# Patient Record
Sex: Male | Born: 1960 | Race: White | Hispanic: No | Marital: Single | Smoking: Current some day smoker
Health system: Southern US, Community
[De-identification: ages and names within clinical notes are randomized; demographics above are authoritative.]

## PROBLEM LIST (undated history)

## (undated) DIAGNOSIS — I10 Essential (primary) hypertension: Secondary | ICD-10-CM

## (undated) DIAGNOSIS — I639 Cerebral infarction, unspecified: Secondary | ICD-10-CM

## (undated) DIAGNOSIS — E119 Type 2 diabetes mellitus without complications: Secondary | ICD-10-CM

## (undated) DIAGNOSIS — K219 Gastro-esophageal reflux disease without esophagitis: Secondary | ICD-10-CM

## (undated) DIAGNOSIS — E785 Hyperlipidemia, unspecified: Secondary | ICD-10-CM

## (undated) DIAGNOSIS — J45909 Unspecified asthma, uncomplicated: Secondary | ICD-10-CM

## (undated) DIAGNOSIS — H409 Unspecified glaucoma: Secondary | ICD-10-CM

## (undated) DIAGNOSIS — R011 Cardiac murmur, unspecified: Secondary | ICD-10-CM

## (undated) DIAGNOSIS — K08109 Complete loss of teeth, unspecified cause, unspecified class: Secondary | ICD-10-CM

## (undated) HISTORY — DX: Cerebral infarction, unspecified: I63.9

## (undated) HISTORY — DX: Unspecified asthma, uncomplicated: J45.909

## (undated) HISTORY — DX: Hyperlipidemia, unspecified: E78.5

## (undated) HISTORY — DX: Cardiac murmur, unspecified: R01.1

## (undated) HISTORY — DX: Unspecified glaucoma: H40.9

---

## 2006-09-28 ENCOUNTER — Emergency Department: Payer: Self-pay | Admitting: Emergency Medicine

## 2006-09-28 ENCOUNTER — Other Ambulatory Visit: Payer: Self-pay

## 2006-09-29 ENCOUNTER — Ambulatory Visit: Payer: Self-pay | Admitting: Emergency Medicine

## 2007-03-04 ENCOUNTER — Emergency Department: Payer: Self-pay | Admitting: Emergency Medicine

## 2010-01-16 ENCOUNTER — Emergency Department: Payer: Self-pay | Admitting: Emergency Medicine

## 2011-05-28 ENCOUNTER — Ambulatory Visit: Payer: Self-pay | Admitting: Internal Medicine

## 2011-06-25 ENCOUNTER — Ambulatory Visit: Payer: Self-pay | Admitting: Internal Medicine

## 2011-08-06 ENCOUNTER — Ambulatory Visit: Payer: Self-pay | Admitting: Internal Medicine

## 2011-08-25 HISTORY — PX: CARDIAC CATHETERIZATION: SHX172

## 2011-09-14 ENCOUNTER — Emergency Department: Payer: Self-pay | Admitting: *Deleted

## 2011-09-14 LAB — BASIC METABOLIC PANEL
Anion Gap: 10 (ref 7–16)
Calcium, Total: 8.6 mg/dL (ref 8.5–10.1)
Chloride: 111 mmol/L — ABNORMAL HIGH (ref 98–107)
Co2: 24 mmol/L (ref 21–32)
Osmolality: 291 (ref 275–301)

## 2011-09-14 LAB — CBC
HCT: 39.2 % — ABNORMAL LOW (ref 40.0–52.0)
HGB: 13.4 g/dL (ref 13.0–18.0)
MCH: 31.2 pg (ref 26.0–34.0)
MCV: 91 fL (ref 80–100)
RBC: 4.3 10*6/uL — ABNORMAL LOW (ref 4.40–5.90)
RDW: 13.9 % (ref 11.5–14.5)
WBC: 7.4 10*3/uL (ref 3.8–10.6)

## 2011-09-14 LAB — CK TOTAL AND CKMB (NOT AT ARMC)
CK, Total: 68 U/L (ref 35–232)
CK, Total: 64 U/L (ref 35–232)
CK-MB: <0.5 ng/mL — ABNORMAL LOW (ref 0.5–3.6)

## 2011-09-29 ENCOUNTER — Ambulatory Visit: Payer: Self-pay | Admitting: Cardiology

## 2011-10-09 ENCOUNTER — Ambulatory Visit: Payer: Self-pay | Admitting: Internal Medicine

## 2011-12-09 ENCOUNTER — Emergency Department: Payer: Self-pay | Admitting: Emergency Medicine

## 2011-12-09 LAB — COMPREHENSIVE METABOLIC PANEL
Albumin: 4 g/dL (ref 3.4–5.0)
Anion Gap: 10 (ref 7–16)
BUN: 14 mg/dL (ref 7–18)
Co2: 22 mmol/L (ref 21–32)
Creatinine: 0.77 mg/dL (ref 0.60–1.30)
EGFR (Non-African Amer.): >60
Glucose: 87 mg/dL (ref 65–99)
SGOT(AST): 31 U/L (ref 15–37)
Total Protein: 7.5 g/dL (ref 6.4–8.2)

## 2011-12-09 LAB — CBC
HCT: 41.9 % (ref 40.0–52.0)
MCH: 30.5 pg (ref 26.0–34.0)
MCHC: 32.9 g/dL (ref 32.0–36.0)
Platelet: 180 10*3/uL (ref 150–440)
RBC: 4.52 10*6/uL (ref 4.40–5.90)
WBC: 7 10*3/uL (ref 3.8–10.6)

## 2011-12-09 LAB — TROPONIN I
Troponin-I: <0.02 ng/mL
Troponin-I: <0.02 ng/mL

## 2012-06-22 ENCOUNTER — Ambulatory Visit: Payer: Self-pay | Admitting: Internal Medicine

## 2012-06-22 LAB — CREATININE, SERUM
EGFR (African American): >60
EGFR (Non-African Amer.): >60

## 2012-12-20 ENCOUNTER — Emergency Department: Payer: Self-pay | Admitting: Emergency Medicine

## 2012-12-20 LAB — COMPREHENSIVE METABOLIC PANEL
Alkaline Phosphatase: 112 U/L (ref 50–136)
Anion Gap: 7 (ref 7–16)
BUN: 19 mg/dL — ABNORMAL HIGH (ref 7–18)
Chloride: 113 mmol/L — ABNORMAL HIGH (ref 98–107)
Creatinine: 0.67 mg/dL (ref 0.60–1.30)
Glucose: 110 mg/dL — ABNORMAL HIGH (ref 65–99)
Osmolality: 286 (ref 275–301)
Potassium: 3.9 mmol/L (ref 3.5–5.1)
SGPT (ALT): 45 U/L (ref 12–78)
Total Protein: 7.4 g/dL (ref 6.4–8.2)

## 2012-12-20 LAB — CBC
HGB: 13.9 g/dL (ref 13.0–18.0)
MCH: 31.5 pg (ref 26.0–34.0)
MCHC: 34.5 g/dL (ref 32.0–36.0)
MCV: 91 fL (ref 80–100)
Platelet: 184 10*3/uL (ref 150–440)
RBC: 4.42 10*6/uL (ref 4.40–5.90)
RDW: 13.9 % (ref 11.5–14.5)

## 2012-12-20 LAB — TROPONIN I: Troponin-I: <0.02 ng/mL

## 2014-01-05 ENCOUNTER — Ambulatory Visit: Payer: Self-pay

## 2014-04-15 ENCOUNTER — Inpatient Hospital Stay: Payer: Self-pay | Admitting: Internal Medicine

## 2014-04-15 LAB — CBC WITH DIFFERENTIAL/PLATELET
BASOS ABS: 0.1 10*3/uL (ref 0.0–0.1)
Basophil %: 0.9 %
Eosinophil #: 0.1 10*3/uL (ref 0.0–0.7)
Eosinophil %: 1.3 %
HCT: 46.3 % (ref 40.0–52.0)
HGB: 15.5 g/dL (ref 13.0–18.0)
LYMPHS ABS: 1.6 10*3/uL (ref 1.0–3.6)
Lymphocyte %: 27.2 %
MCH: 32 pg (ref 26.0–34.0)
MCHC: 33.4 g/dL (ref 32.0–36.0)
MCV: 96 fL (ref 80–100)
MONO ABS: 0.3 x10 3/mm (ref 0.2–1.0)
Monocyte %: 5.5 %
NEUTROS ABS: 3.9 10*3/uL (ref 1.4–6.5)
Neutrophil %: 65.1 %
Platelet: 166 10*3/uL (ref 150–440)
RBC: 4.85 10*6/uL (ref 4.40–5.90)
RDW: 13.8 % (ref 11.5–14.5)
WBC: 6 10*3/uL (ref 3.8–10.6)

## 2014-04-15 LAB — URINALYSIS, COMPLETE
BLOOD: NEGATIVE
Bacteria: NONE SEEN
Bilirubin,UR: NEGATIVE
Glucose,UR: >=500 mg/dL (ref 0–75)
Leukocyte Esterase: NEGATIVE
Nitrite: NEGATIVE
Ph: 5 (ref 4.5–8.0)
Protein: NEGATIVE
RBC, UR: NONE SEEN /HPF (ref 0–5)
SPECIFIC GRAVITY: 1.03 (ref 1.003–1.030)
SQUAMOUS EPITHELIAL: NONE SEEN
WBC UR: NONE SEEN /HPF (ref 0–5)

## 2014-04-15 LAB — BASIC METABOLIC PANEL
ANION GAP: 10 (ref 7–16)
BUN: 15 mg/dL (ref 7–18)
CHLORIDE: 98 mmol/L (ref 98–107)
CREATININE: 1.1 mg/dL (ref 0.60–1.30)
Calcium, Total: 8.7 mg/dL (ref 8.5–10.1)
Co2: 24 mmol/L (ref 21–32)
EGFR (African American): >60
EGFR (Non-African Amer.): >60
GLUCOSE: 732 mg/dL — AB (ref 65–99)
OSMOLALITY: 301 (ref 275–301)
Potassium: 4.3 mmol/L (ref 3.5–5.1)
SODIUM: 132 mmol/L — AB (ref 136–145)

## 2014-04-15 LAB — TSH: Thyroid Stimulating Horm: 0.952 u[IU]/mL

## 2014-04-15 LAB — HEMOGLOBIN A1C: Hemoglobin A1C: 11 % — ABNORMAL HIGH (ref 4.2–6.3)

## 2014-04-15 LAB — BETA-HYDROXYBUTYRIC ACID: Beta-Hydroxybutyrate: 16.4 mg/dL — ABNORMAL HIGH (ref 0.2–2.8)

## 2014-04-16 LAB — COMPREHENSIVE METABOLIC PANEL
ALBUMIN: 3.3 g/dL — AB (ref 3.4–5.0)
ALK PHOS: 121 U/L — AB
Anion Gap: 9 (ref 7–16)
BUN: 13 mg/dL (ref 7–18)
Bilirubin,Total: 0.4 mg/dL (ref 0.2–1.0)
CO2: 24 mmol/L (ref 21–32)
CREATININE: 0.74 mg/dL (ref 0.60–1.30)
Calcium, Total: 8.2 mg/dL — ABNORMAL LOW (ref 8.5–10.1)
Chloride: 110 mmol/L — ABNORMAL HIGH (ref 98–107)
EGFR (Non-African Amer.): >60
GLUCOSE: 169 mg/dL — AB (ref 65–99)
OSMOLALITY: 289 (ref 275–301)
Potassium: 3.5 mmol/L (ref 3.5–5.1)
SGOT(AST): 26 U/L (ref 15–37)
SGPT (ALT): 56 U/L
Sodium: 143 mmol/L (ref 136–145)
Total Protein: 6.5 g/dL (ref 6.4–8.2)

## 2014-04-16 LAB — CBC WITH DIFFERENTIAL/PLATELET
BASOS ABS: 0 10*3/uL (ref 0.0–0.1)
Basophil %: 0.5 %
EOS ABS: 0.1 10*3/uL (ref 0.0–0.7)
Eosinophil %: 1.6 %
HCT: 40.9 % (ref 40.0–52.0)
HGB: 13.7 g/dL (ref 13.0–18.0)
LYMPHS ABS: 2.8 10*3/uL (ref 1.0–3.6)
Lymphocyte %: 46.3 %
MCH: 31.4 pg (ref 26.0–34.0)
MCHC: 33.6 g/dL (ref 32.0–36.0)
MCV: 94 fL (ref 80–100)
MONOS PCT: 6.3 %
Monocyte #: 0.4 x10 3/mm (ref 0.2–1.0)
Neutrophil #: 2.7 10*3/uL (ref 1.4–6.5)
Neutrophil %: 45.3 %
Platelet: 161 10*3/uL (ref 150–440)
RBC: 4.37 10*6/uL — AB (ref 4.40–5.90)
RDW: 13.7 % (ref 11.5–14.5)
WBC: 6 10*3/uL (ref 3.8–10.6)

## 2014-04-16 LAB — LIPID PANEL
CHOLESTEROL: 161 mg/dL (ref 0–200)
HDL: 24 mg/dL — AB (ref 40–60)
Triglycerides: 410 mg/dL — ABNORMAL HIGH (ref 0–200)

## 2014-05-04 ENCOUNTER — Emergency Department: Payer: Self-pay | Admitting: Emergency Medicine

## 2014-06-20 LAB — HM DIABETES EYE EXAM

## 2014-08-03 ENCOUNTER — Emergency Department: Payer: Self-pay | Admitting: Emergency Medicine

## 2014-08-03 LAB — CBC
HCT: 48.4 % (ref 40.0–52.0)
HGB: 15.6 g/dL (ref 13.0–18.0)
MCH: 30.5 pg (ref 26.0–34.0)
MCHC: 32.3 g/dL (ref 32.0–36.0)
MCV: 94 fL (ref 80–100)
PLATELETS: 191 10*3/uL (ref 150–440)
RBC: 5.13 10*6/uL (ref 4.40–5.90)
RDW: 14.3 % (ref 11.5–14.5)
WBC: 5.8 10*3/uL (ref 3.8–10.6)

## 2014-08-03 LAB — COMPREHENSIVE METABOLIC PANEL
ALBUMIN: 3.9 g/dL (ref 3.4–5.0)
ALK PHOS: 80 U/L
ALT: 48 U/L
AST: 35 U/L (ref 15–37)
Anion Gap: 5 — ABNORMAL LOW (ref 7–16)
BILIRUBIN TOTAL: 0.5 mg/dL (ref 0.2–1.0)
BUN: 15 mg/dL (ref 7–18)
CALCIUM: 9 mg/dL (ref 8.5–10.1)
CO2: 29 mmol/L (ref 21–32)
Chloride: 107 mmol/L (ref 98–107)
Creatinine: 0.98 mg/dL (ref 0.60–1.30)
EGFR (African American): >60
EGFR (Non-African Amer.): >60
Glucose: 81 mg/dL (ref 65–99)
Osmolality: 281 (ref 275–301)
POTASSIUM: 4.1 mmol/L (ref 3.5–5.1)
SODIUM: 141 mmol/L (ref 136–145)
Total Protein: 8.5 g/dL — ABNORMAL HIGH (ref 6.4–8.2)

## 2014-08-03 LAB — TROPONIN I

## 2014-12-15 NOTE — Discharge Summary (Signed)
PATIENT NAME:  Roy LeisureHOMPSON, Roy Norton MR#:  [id number] DATE OF BIRTH:  1960/08/26  DATE OF ADMISSION:  04/15/2014  DATE OF DISCHARGE:  04/16/2014  PRESENTING COMPLAINT: Dry mouth, generalized weakness, blurred vision.   DISCHARGE DIAGNOSES:  1. Uncontrolled type 2 diabetes.  2. Morbid obesity.  3. Hypertension.  4. Migraine headaches.  CODE STATUS: Full code.   MEDICATIONS:  1. Topamax 50 mg 1 tablet b.i.d.  2. Lopressor 1.25 one tablet b.i.d. as needed.  3. Metoprolol ER 25 mg daily.  4. Prilosec 20 mg p.o. daily.  5. Lisinopril 2.5 mg daily.  6. Lovastatin 10 mg daily.  7. Glipizide 10 mg b.i.d.  8. Metformin 500 p.o. daily.   [facility] in 2 to 4 weeks.   BRIEF SUMMARY OF HOSPITAL COURSE:  Zettie PhoDanny Kirwan is a 54 year old obese Caucasian gentleman who came in to the Emergency Room with generalized weakness, blurred vision, and dry mouth. He was found to have:   1. Type 2 diabetes new diagnosis came in with hypoglycemia was on insulin, detemir and SSI. His hemoglobin A1c is. He was started on p.o. metformin b.i.d. Diabetes education was provided prior to discharge.  2. Hyponatremia initially due to elevated sugars and dehydration. Stable.  3. Hypertension on metoprolol and lisinopril.  4. Hyperlipidemia on statins.  5. Migraine headaches. Continue Topamax.   The patient will [facility] in 2 to 4 weeks. He will buy a glucometer and testing strips to keep a log of his sugars. Hospital stay otherwise remained stable.   CODE STATUS: The patient remained a full code.   TIME SPENT: 40 minutes.    ____________________________ Wylie HailSona A. Allena KatzPatel, MD sap:JT D: 04/19/2014 13:41:34 ET T: 04/19/2014 23:21:32 ET JOB#: [id number]  cc: Laurajean Hosek A. Allena KatzPatel, MD, <Dictator> Willow OraSONA A Clifford Benninger MD ELECTRONICALLY SIGNED 05/01/2014 14:56

## 2014-12-15 NOTE — H&P (Signed)
PATIENT NAME:  Roy Norton, Roy E MR#:  [id number] DATE OF BIRTH:  [birth year]  PRIMARY CARE PHYSICIAN: [facility].  The patient is a [age] year old Caucasian male with past medical history significant for history of arrhythmias, chest pains in the past, which were investigated by Dr. Judithann SheenSparks at least 2 years ago with a stress test, which was normal. Also hypertension, hyperlipidemia, migraine headaches, who presents to the hospital with complaints of not feeling well for the past 1 week. Apparently, he has been having dry mouth, also generalized weakness, dizziness, and blurry vision in both eyes. He arrived in the Emergency Room, he was noted to have blood glucose level of more than 700, and hospitalist services were contacted for admission.   PAST MEDICAL HISTORY: Significant for history of hypertension, hyperlipidemia, obesity, migraine headaches, anxiety, depression, gastroesophageal reflux disease, as well as chest pains as well as arrhythmias, investigated by Dr. Judithann SheenSparks more than 2 years ago with stress test, which was unremarkable.   MEDICATIONS: According to medical records, the patient is on metoprolol succinate 25 mg p.o. daily, Prilosec 20  mg p.o. daily, Topamax 50 mg p.o. twice daily. He was on alprazolam in the past for his anxiety; however, he is not taking this medication anymore.  PAST SURGICAL HISTORY:  None.  ALLERGIES:  None.  FAMILY HISTORY: Hypertension in the patient's mother, diabetes mellitus in patient's father; PTSD, as well as alcoholism, in patient's father.   SOCIAL HISTORY: The patient is single, has no children. Smokes approximately 1 pack per day since the age of 54.  Denies alcohol abuse. He works for [employer].   REVIEW OF SYSTEMS: Positive for feeling chilly for the past 1 week. Some blurring of vision and dry mouth. Also feeling very polydipsic and polyuric. Snoring as well as sleepiness in daytime is also prevalent. Coughing with some yellow phlegm for  the past 1 week. Also, lung sound wheezing. Has some intermittent chest pains, for which he takes aspirin intermittently as needed. As mentioned above, he had stress test approximately 2 years ago, which was negative. Admits to having intermittent palpitations, also feeling presyncopal over the past 1 week. Increasing frequency of urination and polyuria. Mentions cramping intermittently in his lower extremities. Denies any fevers, pain, weight loss or gain. No rash or ulcer, double vision, glaucoma, cataracts, denies any tinnitus, allergies, epistaxis, sinus pain, dentures, difficulty swallowing. Denies any  hemoptysis, asthma, COPD.  CARDIOVASCULAR: Denies orthopnea, arrhythmias.  GASTROINTESTINAL: Denies nausea, vomiting, diarrhea, or constipation.  GENITOURINARY: Denies dysuria, hematuria, and incontinence.  ENDOCRINE: Admits to polydipsia, admits to nocturia. Denies any thyroid problems. No heat or cold intolerance. Admits thirst.  HEMATOLOGIC: Denies anemia, easy bruising, bleeding, swollen glands.  SKIN: Denies any acne, rashes, lesions, or moles.  MUSCULOSKELETAL: Denies arthritis, cramps, swelling of joints.  NEUROLOGIC: No numbness, epilepsy, or tremor.  PSYCHIATRIC: Denies anxiety, insomnia, or depression.   PHYSICAL EXAMINATION: VITAL SIGNS: On arrival to the hospital, temperature is 98.2, pulse is 94, respiration was 20, blood pressure 159/87, saturation was 93% on room air.  GENERAL: This is a well-nourished, obese, Caucasian male in no significant distress, lying on the stretcher.  HEENT: His pupils equal, reactive to light, extraocular muscles intact, no icterus or conjunctivitis, has normal hearing. No pharyngeal erythema. Mucosa is dry.  NECK: No masses. Supple, nontender. Thyroid is not enlarged. No adenopathy. No JVD or carotid bruits bilaterally. Full range of motion.  LUNGS: Clear to auscultation, though diminished breath sounds especially on the bases. No significant wheezing  was noted. No labored inspirations, increased effort, dullness to percussion, or overt respiratory distress. He has intermittent coughing.  CARDIOVASCULAR: S1, S2 present. The rhythm is regular. PMI not lateralized. Chest is nontender to palpation. 1+ pedal pulses. No lower extremity edema, calf tenderness or cyanosis was noted.  ABDOMEN: Soft, nontender, protuberant. Bowel sounds are present. No splenomegalia or masses were noted.  RECTAL: Deferred.  EXTREMITIES:  Muscle strength: Able to move all extremities. No cyanosis, degenerative joint disease, or kyphosis. Gait was not tested.  SKIN: No evidence of rashes, lesions, erythema. No history of nodularity or induration. It was warm and dry to palpation. No adenopathy in the cervical region.  NEUROLOGICAL: Cranial nerves grossly intact. Sensory is intact. No dysphasia, aphasia. The patient is alert, oriented to time, person, place, cooperative. Memory is good.  No significant confusion, agitation, or depression noted.   LABORATORY DATA: BMP showed a glucose level of 732, sodium 132, beta hydroxybutyrate was 16.4, which is elevated. The patient's bicarbonate level was 24. Liver enzymes are not checked. CBC within normal limits with white blood cell count 6.0, hemoglobin 15.5, platelet count 166,000. Absolute neutrophil  count is 3.9. Urinalysis: Colorless, clear urine. More than 500 glucose, negative for bilirubin, 1+ ketones, specific gravity 1.030, pH was 5.0, negative for blood, protein, nitrites, or leukocyte esterase. No red blood cells, white blood cells, bacteria, or epithelial cells.   RADIOLOGIC STUDIES: None.   ASSESSMENT AND PLAN: 1.  Diabetes mellitus, new diagnosis, with hyperglycemia, suspected mild diabetic ketoacidosis. Admit patient to medical floor in critical care unit, starting him on insulin intravenous drip. Get hemoglobin A1c.  Will ask dietary to see patient as well as diabetes education and educate him about possibly weight  loss, and this was discussed already on admission. 2.  Hyponatremia, likely dehydration. We will continue intravenous fluids. We will follow the patient's sodium level.  3.  Hypertension. We will add lisinopril. 4.  Hyperlipidemia. Get lipid panel. Will add statin as needed.  5.  Migraine headaches. We will continue Topamax.  6. Tobacco abuse. Counseling provided for approximately 4 to 5 minutes. Agreeable to try replacement therapy.   TIME SPENT: Fifty minutes on this patient.    ____________________________ Katharina Caper, MD rv:LT D: 04/15/2014 14:48:34 ET T: 04/15/2014 16:31:26 ET JOB#: [id number]  cc: Katharina Caper, MD, <Dictator> Cornelious Diven MD ELECTRONICALLY SIGNED 05/22/2014 14:50

## 2015-01-01 ENCOUNTER — Ambulatory Visit: Payer: Self-pay

## 2015-01-08 ENCOUNTER — Ambulatory Visit
Admission: RE | Admit: 2015-01-08 | Discharge: 2015-01-08 | Disposition: A | Discharge status: Home / Self Care | Payer: Self-pay | Source: Ambulatory Visit | Attending: Nurse Practitioner | Admitting: Nurse Practitioner

## 2015-01-08 ENCOUNTER — Other Ambulatory Visit: Payer: Self-pay

## 2015-01-08 DIAGNOSIS — R079 Chest pain, unspecified: Secondary | ICD-10-CM | POA: Insufficient documentation

## 2015-02-27 ENCOUNTER — Ambulatory Visit: Payer: Self-pay | Admitting: Internal Medicine

## 2015-03-18 ENCOUNTER — Encounter: Payer: Self-pay | Admitting: Emergency Medicine

## 2015-03-18 ENCOUNTER — Emergency Department: Payer: Self-pay

## 2015-03-18 ENCOUNTER — Emergency Department
Admission: EM | Admit: 2015-03-18 | Discharge: 2015-03-18 | Disposition: A | Discharge status: Home / Self Care | Payer: Self-pay | Attending: Emergency Medicine | Admitting: Emergency Medicine

## 2015-03-18 DIAGNOSIS — I10 Essential (primary) hypertension: Secondary | ICD-10-CM | POA: Insufficient documentation

## 2015-03-18 DIAGNOSIS — E119 Type 2 diabetes mellitus without complications: Secondary | ICD-10-CM | POA: Insufficient documentation

## 2015-03-18 DIAGNOSIS — R109 Unspecified abdominal pain: Secondary | ICD-10-CM | POA: Insufficient documentation

## 2015-03-18 HISTORY — DX: Type 2 diabetes mellitus without complications: E11.9

## 2015-03-18 HISTORY — DX: Essential (primary) hypertension: I10

## 2015-03-18 HISTORY — DX: Gastro-esophageal reflux disease without esophagitis: K21.9

## 2015-03-18 LAB — BASIC METABOLIC PANEL
ANION GAP: 8 (ref 5–15)
BUN: 23 mg/dL — ABNORMAL HIGH (ref 6–20)
CALCIUM: 9.5 mg/dL (ref 8.9–10.3)
CHLORIDE: 106 mmol/L (ref 101–111)
CO2: 23 mmol/L (ref 22–32)
Creatinine, Ser: 0.95 mg/dL (ref 0.61–1.24)
GFR calc Af Amer: >60 mL/min (ref >60)
GFR calc non Af Amer: >60 mL/min (ref >60)
Glucose, Bld: 90 mg/dL (ref 65–99)
Potassium: 4.1 mmol/L (ref 3.5–5.1)
Sodium: 137 mmol/L (ref 135–145)

## 2015-03-18 LAB — URINALYSIS COMPLETE WITH MICROSCOPIC (ARMC ONLY)
BACTERIA UA: NONE SEEN
BILIRUBIN URINE: NEGATIVE
Glucose, UA: NEGATIVE mg/dL
HGB URINE DIPSTICK: NEGATIVE
KETONES UR: NEGATIVE mg/dL
Leukocytes, UA: NEGATIVE
Nitrite: NEGATIVE
PH: 6 (ref 5.0–8.0)
Protein, ur: 30 mg/dL — AB
SPECIFIC GRAVITY, URINE: 1.031 — AB (ref 1.005–1.030)

## 2015-03-18 LAB — CBC WITH DIFFERENTIAL/PLATELET
BASOS ABS: 0 10*3/uL (ref 0–0.1)
Basophils Relative: 0 %
EOS PCT: 1 %
Eosinophils Absolute: 0.1 10*3/uL (ref 0–0.7)
HCT: 48.6 % (ref 40.0–52.0)
Hemoglobin: 16.2 g/dL (ref 13.0–18.0)
LYMPHS PCT: 29 %
Lymphs Abs: 2.1 10*3/uL (ref 1.0–3.6)
MCH: 31.1 pg (ref 26.0–34.0)
MCHC: 33.2 g/dL (ref 32.0–36.0)
MCV: 93.5 fL (ref 80.0–100.0)
MONOS PCT: 5 %
Monocytes Absolute: 0.3 10*3/uL (ref 0.2–1.0)
Neutro Abs: 4.7 10*3/uL (ref 1.4–6.5)
Neutrophils Relative %: 65 %
Platelets: 199 10*3/uL (ref 150–440)
RBC: 5.2 MIL/uL (ref 4.40–5.90)
RDW: 14.9 % — ABNORMAL HIGH (ref 11.5–14.5)
WBC: 7.2 10*3/uL (ref 3.8–10.6)

## 2015-03-18 MED ORDER — OXYCODONE-ACETAMINOPHEN 5-325 MG PO TABS
1.0000 | ORAL_TABLET | Freq: Once | ORAL | Status: AC
  Administered 2015-03-18: 1 via ORAL
  Filled 2015-03-18: qty 1

## 2015-03-18 MED ORDER — NAPROXEN 250 MG PO TABS: 250.0000 mg | ORAL_TABLET | Freq: Two times a day (BID) | ORAL | Status: DC

## 2015-03-18 MED ORDER — ONDANSETRON 8 MG PO TBDP
8.0000 mg | ORAL_TABLET | Freq: Once | ORAL | Status: AC
  Administered 2015-03-18: 8 mg via ORAL
  Filled 2015-03-18: qty 1

## 2015-03-18 MED ORDER — ONDANSETRON 8 MG PO TBDP: 8.0000 mg | ORAL_TABLET | Freq: Three times a day (TID) | ORAL | Status: DC | PRN

## 2015-03-18 NOTE — ED Provider Notes (Signed)
[facility] Emergency Department Provider Note  ____________________________________________  Time seen: 12:45 PM  I have reviewed the triage vital signs and the nursing notes.   HISTORY  Chief Complaint Back Pain    HPI Roy Norton is a 54 y.o. male who complains of bilateral flank pain that started last night. It was gradual in onset but worsened, colicky with intermittent episodes lasting a few minutes which are frequently severe in intensity. It's nonradiating from the bilateral flanks. No associated nausea vomiting diarrhea fevers chills or anterior abdominal pain. No syncope or lightheadedness, no numbness tingling or weakness or pain in the legs. Denies chest pain or shortness of breath. No aggravating or alleviating factors  Denies any heavy lifting or fall strips or slips.   Past Medical History  Diagnosis Date  . Hypertension   . GERD (gastroesophageal reflux disease)   . Diabetes mellitus without complication     There are no active problems to display for this patient.   History reviewed. No pertinent past surgical history.  Current Outpatient Rx  Name  Route  Sig  Dispense  Refill  . naproxen (NAPROSYN) 250 MG tablet   Oral   Take 1 tablet (250 mg total) by mouth 2 (two) times daily with a meal.   40 tablet   0   . ondansetron (ZOFRAN ODT) 8 MG disintegrating tablet   Oral   Take 1 tablet (8 mg total) by mouth every 8 (eight) hours as needed for nausea or vomiting.   20 tablet   0     Allergies Review of patient's allergies indicates no known allergies.  History reviewed. No pertinent family history.  Social History History  Substance Use Topics  . Smoking status: Never Smoker   . Smokeless tobacco: Not on file  . Alcohol Use: No    Review of Systems  Constitutional: No fever or chills. No weight changes Eyes:No blurry vision or double vision.  ENT: No sore throat. Cardiovascular: No chest pain. Respiratory:  No dyspnea or cough. Gastrointestinal: Negative for abdominal pain, vomiting and diarrhea.  No BRBPR or melena. Genitourinary: Negative for dysuria, urinary retention, bloody urine, or difficulty urinating. Musculoskeletal: Bilateral flank pain Skin: Negative for rash. Neurological: Negative for headaches, focal weakness or numbness. Psychiatric:No anxiety or depression.   Endocrine:No hot/cold intolerance, changes in energy, or sleep difficulty.  10-point ROS otherwise negative.  ____________________________________________   PHYSICAL EXAM:  VITAL SIGNS: ED Triage Vitals  Enc Vitals Group     BP 03/18/15 0942 137/62 mmHg     Pulse Rate 03/18/15 0942 68     Resp 03/18/15 0942 18     Temp 03/18/15 0942 97.6 F (36.4 C)     Temp Source 03/18/15 0942 Oral     SpO2 03/18/15 0942 97 %     Weight 03/18/15 0942 225 lb (102.059 kg)     Height 03/18/15 0942  (1.778 m)     Head Cir --      Peak Flow --      Pain Score 03/18/15 0945 5     Pain Loc --      Pain Edu? --      Excl. in GC? --      Constitutional: Alert and oriented. Well appearing and in no distress. Eyes: No scleral icterus. No conjunctival pallor. PERRL. EOMI ENT   Head: Normocephalic and atraumatic.   Nose: No congestion/rhinnorhea. No septal hematoma   Mouth/Throat: MMM, no pharyngeal erythema. No peritonsillar  mass. No uvula shift.   Neck: No stridor. No SubQ emphysema. No meningismus. Hematological/Lymphatic/Immunilogical: No cervical lymphadenopathy. Cardiovascular: RRR. Normal and symmetric distal pulses are present in all extremities. No murmurs, rubs, or gallops. Respiratory: Normal respiratory effort without tachypnea nor retractions. Breath sounds are clear and equal bilaterally. No wheezes/rales/rhonchi. Gastrointestinal: Left lower quadrant tenderness. No distention. There is no CVA tenderness.  No rebound, rigidity, or guarding. Genitourinary: deferred Musculoskeletal: Nontender  with normal range of motion in all extremities. No joint effusions.  No lower extremity tenderness.  No edema. Neurologic:   Normal speech and language.  CN 2-10 normal. Motor grossly intact. No pronator drift.  Normal gait. No gross focal neurologic deficits are appreciated.  Skin:  Skin is warm, dry and intact. No rash noted.  No petechiae, purpura, or bullae. Psychiatric: Mood and affect are normal. Speech and behavior are normal. Patient exhibits appropriate insight and judgment.  ____________________________________________    LABS (pertinent positives/negatives) (all labs ordered are listed, but only abnormal results are displayed) Labs Reviewed  URINALYSIS COMPLETEWITH MICROSCOPIC ([facility] ONLY) - Abnormal; Notable for the following:    Color, Urine YELLOW (*)    APPearance CLEAR (*)    Specific Gravity, Urine 1.031 (*)    Protein, ur 30 (*)    Squamous Epithelial / LPF 0-5 (*)    All other components within normal limits  BASIC METABOLIC PANEL - Abnormal; Notable for the following:    BUN 23 (*)    All other components within normal limits  CBC WITH DIFFERENTIAL/PLATELET - Abnormal; Notable for the following:    RDW 14.9 (*)    All other components within normal limits   ____________________________________________   EKG    ____________________________________________    RADIOLOGY  CT abdomen and pelvis without contrast unremarkable. No evidence of obstruction and infection or abdominal aortic aneurysm.  ____________________________________________   PROCEDURES  ____________________________________________   INITIAL IMPRESSION / ASSESSMENT AND PLAN / ED COURSE  Pertinent labs & imaging results that were available during my care of the patient were reviewed by me and considered in my medical decision making (see chart for details).  Patient presents with bilateral flank pain that does not appear to be traumatic or musculoskeletal in nature and may be  related to renal colic, diverticulitis, or urinary tract infection. He is not having any frank UTI symptoms. We'll check urinalysis labs and a CT renal stone study. By mouth Zofran and Percocet for symptoms ----------------------------------------- 2:41 PM on 03/18/2015 -----------------------------------------  Symptoms under control. Workup negative. Patient is stable. Likely musculoskeletal although he can't pinpoint exactly the cause. We'll try a course of NSAIDs and have him follow up with primary care. ____________________________________________   FINAL CLINICAL IMPRESSION(S) / ED DIAGNOSES  Final diagnoses:  Bilateral flank pain     Sharman Cheek, MD 03/18/15 1441

## 2015-03-18 NOTE — Discharge Instructions (Signed)
Flank Pain °Flank pain refers to pain that is located on the side of the body between the upper abdomen and the back. The pain may occur over a short period of time (acute) or may be long-term or reoccurring (chronic). It may be mild or severe. Flank pain can be caused by many things. °CAUSES  °Some of the more common causes of flank pain include: °· Muscle strains.   °· Muscle spasms.   °· A disease of your spine (vertebral disk disease).   °· A lung infection (pneumonia).   °· Fluid around your lungs (pulmonary edema).   °· A kidney infection.   °· Kidney stones.   °· A very painful skin rash caused by the chickenpox virus (shingles).   °· Gallbladder disease.   °HOME CARE INSTRUCTIONS  °Home care will depend on the cause of your pain. In general, °· Rest as directed by your caregiver. °· Drink enough fluids to keep your urine clear or pale yellow. °· Only take over-the-counter or prescription medicines as directed by your caregiver. Some medicines may help relieve the pain. °· Tell your caregiver about any changes in your pain. °· Follow up with your caregiver as directed. °SEEK IMMEDIATE MEDICAL CARE IF:  °· Your pain is not controlled with medicine.   °· You have new or worsening symptoms. °· Your pain increases.   °· You have abdominal pain.   °· You have shortness of breath.   °· You have persistent nausea or vomiting.   °· You have swelling in your abdomen.   °· You feel faint or pass out.   °· You have blood in your urine. °· You have a fever or persistent symptoms for more than 2-3 days. °· You have a fever and your symptoms suddenly get worse. °MAKE SURE YOU:  °· Understand these instructions. °· Will watch your condition. °· Will get help right away if you are not doing well or get worse. °Document Released: 10/01/2005 Document Revised: 05/04/2012 Document Reviewed: 03/24/2012 °[employer]® Patient Information ©2015 [employer]. This information is not intended to replace advice given to you by your  health care provider. Make sure you discuss any questions you have with your health care provider. ° °

## 2015-03-18 NOTE — ED Notes (Signed)
Pt to ed with c/o lower back pain bilat.  Pt states " my kidneys are hurting, terrible, since last night constant, it won't stop"  Pt denies history of kidney problems.  Pt denies pain with urination.

## 2015-03-18 NOTE — ED Notes (Signed)
Pt informed to return if any life threatening symptoms occur.  

## 2015-03-21 ENCOUNTER — Ambulatory Visit: Payer: Self-pay

## 2015-04-04 ENCOUNTER — Other Ambulatory Visit: Payer: Self-pay

## 2015-04-11 ENCOUNTER — Ambulatory Visit: Payer: Self-pay

## 2015-04-11 DIAGNOSIS — R42 Dizziness and giddiness: Secondary | ICD-10-CM | POA: Insufficient documentation

## 2015-04-22 ENCOUNTER — Other Ambulatory Visit: Payer: Self-pay | Admitting: Urology

## 2015-04-22 DIAGNOSIS — R42 Dizziness and giddiness: Secondary | ICD-10-CM

## 2015-04-25 ENCOUNTER — Ambulatory Visit
Admission: RE | Admit: 2015-04-25 | Discharge: 2015-04-25 | Disposition: A | Discharge status: Home / Self Care | Payer: Self-pay | Source: Ambulatory Visit | Attending: Urology | Admitting: Urology

## 2015-04-26 ENCOUNTER — Ambulatory Visit
Admission: RE | Admit: 2015-04-26 | Discharge: 2015-04-26 | Disposition: A | Discharge status: Home / Self Care | Payer: Self-pay | Source: Ambulatory Visit | Attending: Urology | Admitting: Urology

## 2015-04-26 DIAGNOSIS — F8181 Disorder of written expression: Secondary | ICD-10-CM | POA: Insufficient documentation

## 2015-04-26 DIAGNOSIS — R42 Dizziness and giddiness: Secondary | ICD-10-CM | POA: Insufficient documentation

## 2015-04-28 ENCOUNTER — Observation Stay
Admission: EM | Admit: 2015-04-28 | Discharge: 2015-04-30 | Disposition: A | Discharge status: Home / Self Care | Payer: Self-pay | Attending: Internal Medicine | Admitting: Internal Medicine

## 2015-04-28 ENCOUNTER — Emergency Department: Payer: Self-pay

## 2015-04-28 ENCOUNTER — Observation Stay
Admit: 2015-04-28 | Discharge: 2015-04-28 | Disposition: A | Discharge status: Home / Self Care | Payer: Self-pay | Attending: Internal Medicine | Admitting: Internal Medicine

## 2015-04-28 ENCOUNTER — Encounter: Payer: Self-pay | Admitting: Medical Oncology

## 2015-04-28 DIAGNOSIS — R079 Chest pain, unspecified: Secondary | ICD-10-CM

## 2015-04-28 DIAGNOSIS — R0789 Other chest pain: Principal | ICD-10-CM | POA: Insufficient documentation

## 2015-04-28 DIAGNOSIS — E669 Obesity, unspecified: Secondary | ICD-10-CM

## 2015-04-28 DIAGNOSIS — Z7982 Long term (current) use of aspirin: Secondary | ICD-10-CM | POA: Insufficient documentation

## 2015-04-28 DIAGNOSIS — Z79899 Other long term (current) drug therapy: Secondary | ICD-10-CM | POA: Insufficient documentation

## 2015-04-28 DIAGNOSIS — E785 Hyperlipidemia, unspecified: Secondary | ICD-10-CM | POA: Insufficient documentation

## 2015-04-28 DIAGNOSIS — I2510 Atherosclerotic heart disease of native coronary artery without angina pectoris: Secondary | ICD-10-CM | POA: Insufficient documentation

## 2015-04-28 DIAGNOSIS — I10 Essential (primary) hypertension: Secondary | ICD-10-CM

## 2015-04-28 DIAGNOSIS — K219 Gastro-esophageal reflux disease without esophagitis: Secondary | ICD-10-CM | POA: Insufficient documentation

## 2015-04-28 DIAGNOSIS — E1169 Type 2 diabetes mellitus with other specified complication: Secondary | ICD-10-CM

## 2015-04-28 DIAGNOSIS — F17210 Nicotine dependence, cigarettes, uncomplicated: Secondary | ICD-10-CM | POA: Insufficient documentation

## 2015-04-28 DIAGNOSIS — Z87891 Personal history of nicotine dependence: Secondary | ICD-10-CM

## 2015-04-28 DIAGNOSIS — R0602 Shortness of breath: Secondary | ICD-10-CM | POA: Insufficient documentation

## 2015-04-28 DIAGNOSIS — E119 Type 2 diabetes mellitus without complications: Secondary | ICD-10-CM

## 2015-04-28 LAB — CBC
HCT: 41.2 % (ref 40.0–52.0)
HEMOGLOBIN: 13.9 g/dL (ref 13.0–18.0)
MCH: 31.5 pg (ref 26.0–34.0)
MCHC: 33.8 g/dL (ref 32.0–36.0)
MCV: 93 fL (ref 80.0–100.0)
PLATELETS: 177 10*3/uL (ref 150–440)
RBC: 4.43 MIL/uL (ref 4.40–5.90)
RDW: 13.9 % (ref 11.5–14.5)
WBC: 7.8 10*3/uL (ref 3.8–10.6)

## 2015-04-28 LAB — BASIC METABOLIC PANEL
ANION GAP: 6 (ref 5–15)
BUN: 15 mg/dL (ref 6–20)
CALCIUM: 9.2 mg/dL (ref 8.9–10.3)
CHLORIDE: 109 mmol/L (ref 101–111)
CO2: 26 mmol/L (ref 22–32)
Creatinine, Ser: 0.81 mg/dL (ref 0.61–1.24)
GFR calc non Af Amer: >60 mL/min (ref >60)
Glucose, Bld: 77 mg/dL (ref 65–99)
Potassium: 4.4 mmol/L (ref 3.5–5.1)
SODIUM: 141 mmol/L (ref 135–145)

## 2015-04-28 LAB — GLUCOSE, CAPILLARY
Glucose-Capillary: 129 mg/dL — ABNORMAL HIGH (ref 65–99)
Glucose-Capillary: 126 mg/dL — ABNORMAL HIGH (ref 65–99)

## 2015-04-28 LAB — TROPONIN I
Troponin I: <0.03 ng/mL (ref <0.031)
Troponin I: <0.03 ng/mL (ref <0.031)

## 2015-04-28 MED ORDER — INFLUENZA VAC SPLIT QUAD 0.5 ML IM SUSY
0.5000 mL | PREFILLED_SYRINGE | INTRAMUSCULAR | Status: AC
  Administered 2015-04-29: 0.5 mL via INTRAMUSCULAR
  Filled 2015-04-28: qty 0.5

## 2015-04-28 MED ORDER — PRAVASTATIN SODIUM 40 MG PO TABS
40.0000 mg | ORAL_TABLET | Freq: Every day | ORAL | Status: DC
  Administered 2015-04-29: 40 mg via ORAL
  Filled 2015-04-28 (×4): qty 1

## 2015-04-28 MED ORDER — ACETAMINOPHEN 650 MG RE SUPP
650.0000 mg | Freq: Four times a day (QID) | RECTAL | Status: DC | PRN
Start: 2015-04-28 — End: 2015-04-30

## 2015-04-28 MED ORDER — ASPIRIN 81 MG PO CHEW
324.0000 mg | CHEWABLE_TABLET | Freq: Once | ORAL | Status: AC
  Administered 2015-04-28: 324 mg via ORAL

## 2015-04-28 MED ORDER — BISACODYL 10 MG RE SUPP: 10.0000 mg | Freq: Every day | RECTAL | Status: DC | PRN

## 2015-04-28 MED ORDER — ASPIRIN 81 MG PO CHEW
81.0000 mg | CHEWABLE_TABLET | Freq: Every day | ORAL | Status: DC
  Administered 2015-04-29 – 2015-04-30 (×2): 81 mg via ORAL
  Filled 2015-04-28 (×2): qty 1

## 2015-04-28 MED ORDER — DOCUSATE SODIUM 100 MG PO CAPS
100.0000 mg | ORAL_CAPSULE | Freq: Two times a day (BID) | ORAL | Status: DC
  Administered 2015-04-28 – 2015-04-30 (×4): 100 mg via ORAL
  Filled 2015-04-28 (×4): qty 1

## 2015-04-28 MED ORDER — ACETAMINOPHEN 325 MG PO TABS: 650.0000 mg | ORAL_TABLET | Freq: Four times a day (QID) | ORAL | Status: DC | PRN

## 2015-04-28 MED ORDER — MORPHINE SULFATE (PF) 2 MG/ML IV SOLN: 2.0000 mg | INTRAVENOUS | Status: DC | PRN

## 2015-04-28 MED ORDER — ONDANSETRON HCL 4 MG PO TABS: 4.0000 mg | ORAL_TABLET | Freq: Four times a day (QID) | ORAL | Status: DC | PRN

## 2015-04-28 MED ORDER — SODIUM CHLORIDE 0.9 % IJ SOLN: 3.0000 mL | INTRAMUSCULAR | Status: DC | PRN

## 2015-04-28 MED ORDER — SODIUM CHLORIDE 0.9 % IV SOLN: 250.0000 mL | INTRAVENOUS | Status: DC | PRN

## 2015-04-28 MED ORDER — ONDANSETRON HCL 4 MG/2ML IJ SOLN: 4.0000 mg | Freq: Four times a day (QID) | INTRAMUSCULAR | Status: DC | PRN

## 2015-04-28 MED ORDER — ASPIRIN 81 MG PO CHEW
324.0000 mg | CHEWABLE_TABLET | Freq: Once | ORAL | Status: DC
  Filled 2015-04-28: qty 4

## 2015-04-28 MED ORDER — ENOXAPARIN SODIUM 40 MG/0.4ML ~~LOC~~ SOLN
40.0000 mg | SUBCUTANEOUS | Status: DC
  Administered 2015-04-28 – 2015-04-29 (×2): 40 mg via SUBCUTANEOUS
  Filled 2015-04-28 (×3): qty 0.4

## 2015-04-28 MED ORDER — METOPROLOL SUCCINATE ER 25 MG PO TB24
25.0000 mg | ORAL_TABLET | Freq: Every day | ORAL | Status: DC
  Administered 2015-04-29 – 2015-04-30 (×2): 25 mg via ORAL
  Filled 2015-04-28 (×2): qty 1

## 2015-04-28 MED ORDER — SODIUM CHLORIDE 0.9 % IJ SOLN
3.0000 mL | Freq: Two times a day (BID) | INTRAMUSCULAR | Status: DC
  Administered 2015-04-28 – 2015-04-30 (×4): 3 mL via INTRAVENOUS

## 2015-04-28 MED ORDER — OXYCODONE HCL 5 MG PO TABS
5.0000 mg | ORAL_TABLET | Freq: Four times a day (QID) | ORAL | Status: DC | PRN
Start: 2015-04-28 — End: 2015-04-30

## 2015-04-28 MED ORDER — NITROGLYCERIN 0.4 MG SL SUBL
0.4000 mg | SUBLINGUAL_TABLET | SUBLINGUAL | Status: DC | PRN
  Filled 2015-04-28: qty 1

## 2015-04-28 MED ORDER — TOPIRAMATE 25 MG PO TABS
50.0000 mg | ORAL_TABLET | Freq: Two times a day (BID) | ORAL | Status: DC
  Administered 2015-04-28 – 2015-04-30 (×4): 50 mg via ORAL
  Filled 2015-04-28 (×6): qty 2

## 2015-04-28 MED ORDER — PNEUMOCOCCAL VAC POLYVALENT 25 MCG/0.5ML IJ INJ
0.5000 mL | INJECTION | INTRAMUSCULAR | Status: AC
  Administered 2015-04-29: 0.5 mL via INTRAMUSCULAR
  Filled 2015-04-28: qty 0.5

## 2015-04-28 NOTE — H&P (Signed)
History and Physical    Roy Norton ZOX:[id number] DOB: 07-05-1961 DOA: 04/28/2015  Referring physician: Dr. Pershing Proud PCP: No PCP Per Patient  Specialists: none  Chief Complaint: chest pain  HPI: Roy Norton is a 54 y.o. male has a past medical history significant for HTN, hyperlipidemia, and DM now with SSCP at rest radiating to left arm with nausea, SOB, and sweats. Pain was "squeezing" in nature lasting up to 30 minutes. Currently pain free. EKG and initial cardiac enzymes OK.  Review of Systems: The patient denies anorexia, fever, weight loss,, vision loss, decreased hearing, hoarseness syncope, dyspnea on exertion, peripheral edema, balance deficits, hemoptysis, abdominal pain, melena, hematochezia, severe indigestion/heartburn, hematuria, incontinence, genital sores, muscle weakness, suspicious skin lesions, transient blindness, difficulty walking, depression, unusual weight change, abnormal bleeding, enlarged lymph nodes, angioedema, and breast masses.   Past Medical History  Diagnosis Date  . Hypertension   . GERD (gastroesophageal reflux disease)   . Diabetes mellitus without complication    History reviewed. No pertinent past surgical history. Social History:  reports that he has been smoking.  He does not have any smokeless tobacco history on file. He reports that he does not drink alcohol or use illicit drugs.  Allergies  Allergen Reactions  . Citalopram Nausea Only  . Omeprazole Nausea Only    History reviewed. No pertinent family history.  Prior to Admission medications   Medication Sig Start Date End Date Taking? Authorizing Provider  aspirin 81 MG chewable tablet Chew 81 mg by mouth daily.   Yes Historical Provider, MD  lovastatin (MEVACOR) 40 MG tablet Take 40 mg by mouth every morning. With dinner   Yes Historical Provider, MD  metoprolol succinate (TOPROL-XL) 25 MG 24 hr tablet Take 25 mg by mouth daily.   Yes Historical Provider, MD  naproxen  (NAPROSYN) 250 MG tablet Take 1 tablet (250 mg total) by mouth 2 (two) times daily with a meal. 03/18/15  Yes Sharman Cheek, MD  ondansetron (ZOFRAN ODT) 8 MG disintegrating tablet Take 1 tablet (8 mg total) by mouth every 8 (eight) hours as needed for nausea or vomiting. 03/18/15  Yes Sharman Cheek, MD  oxyCODONE (OXY IR/ROXICODONE) 5 MG immediate release tablet Take 5 mg by mouth every 6 (six) hours as needed. For pain. 05/19/14  Yes Historical Provider, MD  topiramate (TOPAMAX) 50 MG tablet Take 50 mg by mouth 2 (two) times daily.   Yes Historical Provider, MD   Physical Exam: Filed Vitals:   04/28/15 1146 04/28/15 1405  BP: 139/73 121/68  Pulse: 54 57  Temp: 97.8 F (36.6 C)   TempSrc: Oral   Resp: 18 14  Height:  (1.778 m)   Weight: 99.791 kg (220 lb)   SpO2: 100% 99%     General:  No apparent distress  Eyes: PERRL, EOMI, no scleral icterus  ENT: moist oropharynx  Neck: supple, no lymphadenopathy  Cardiovascular: regular rate without MRG; 2+ peripheral pulses, no JVD, no peripheral edema  Respiratory: CTA biL, good air movement without wheezing, rhonchi or crackled  Abdomen: soft, non tender to palpation, positive bowel sounds, no guarding, no rebound  Skin: no rashes  Musculoskeletal: normal bulk and tone, no joint swelling  Psychiatric: normal mood and affect  Neurologic: CN 2-12 grossly intact, MS 5/5 in all 4  Labs on Admission:  Basic Metabolic Panel:  Recent Labs Lab 04/28/15 1323  NA 141  K 4.4  CL 109  CO2 26  GLUCOSE 77  BUN 15  CREATININE 0.81  CALCIUM 9.2   Liver Function Tests: No results for input(s): AST, ALT, ALKPHOS, BILITOT, PROT, ALBUMIN in the last 168 hours. No results for input(s): LIPASE, AMYLASE in the last 168 hours. No results for input(s): AMMONIA in the last 168 hours. CBC:  Recent Labs Lab 04/28/15 1323  WBC 7.8  HGB 13.9  HCT 41.2  MCV 93.0  PLT 177   Cardiac Enzymes:  Recent Labs Lab 04/28/15 1323   TROPONINI <0.03    BNP (last 3 results) No results for input(s): BNP in the last 8760 hours.  ProBNP (last 3 results) No results for input(s): PROBNP in the last 8760 hours.  CBG: No results for input(s): GLUCAP in the last 168 hours.  Radiological Exams on Admission: Dg Chest 2 View  04/28/2015   CLINICAL DATA:  Chest pain for 1 day with shortness breath.  EXAM: CHEST  2 VIEW  COMPARISON:  08/03/2014 and prior chest radiographs dating back to 09/14/2011  FINDINGS: The cardiomediastinal silhouette is unremarkable.  There is no evidence of focal airspace disease, pulmonary edema, suspicious pulmonary nodule/mass, pleural effusion, or pneumothorax. No acute bony abnormalities are identified.  IMPRESSION: No active cardiopulmonary disease.   Electronically Signed   By: Harmon Pier M.D.   On: 04/28/2015 12:38    EKG: Independently reviewed.  Assessment/Plan Active Problems:   Chest pain at rest   Will observe on telemetry. Follow enzymes. Check echo. Consult Cardiology for possible cath.  Diet: heart healthy Fluids: saline lock DVT Prophylaxis: Lovenox  Code Status: FULL  Family Communication: yes  Disposition Plan: home  Time spent: 45 min

## 2015-04-28 NOTE — ED Provider Notes (Signed)
[facility] Emergency Department Provider Note  ____________________________________________  Time seen: Approximately 12 PM  I have reviewed the triage vital signs and the nursing notes.   HISTORY  Chief Complaint Chest Pain    HPI Roy Norton is a 54 y.o. male with a history of diabetes and hypertension who is presenting today with 3-4 episodes of left-sided chest pain radiating to his left arm. He says the episodes last for about 5 minutes and come on unprovoked. He says they feel like someone is stabbing him in the chest. He says they're associated with diaphoresis and some mild shortness of breath. He says that he has been having them with exertion for some time now. He is pain-free and his chest at this time. However, does say that he has pain radiating down his left arm. No pain with movement. No recent heavy lifting.   Past Medical History  Diagnosis Date  . Hypertension   . GERD (gastroesophageal reflux disease)   . Diabetes mellitus without complication     There are no active problems to display for this patient.   History reviewed. No pertinent past surgical history.  Current Outpatient Rx  Name  Route  Sig  Dispense  Refill  . aspirin 81 MG chewable tablet   Oral   Chew 81 mg by mouth daily.         Marland Kitchen lovastatin (MEVACOR) 40 MG tablet   Oral   Take 40 mg by mouth every morning. With dinner         . metoprolol succinate (TOPROL-XL) 25 MG 24 hr tablet   Oral   Take 25 mg by mouth daily.         . naproxen (NAPROSYN) 250 MG tablet   Oral   Take 1 tablet (250 mg total) by mouth 2 (two) times daily with a meal.   40 tablet   0   . ondansetron (ZOFRAN ODT) 8 MG disintegrating tablet   Oral   Take 1 tablet (8 mg total) by mouth every 8 (eight) hours as needed for nausea or vomiting.   20 tablet   0   . oxyCODONE (OXY IR/ROXICODONE) 5 MG immediate release tablet   Oral   Take 5 mg by mouth every 6 (six) hours as  needed. For pain.         Marland Kitchen topiramate (TOPAMAX) 50 MG tablet   Oral   Take 50 mg by mouth 2 (two) times daily.           Allergies Citalopram and Omeprazole  No family history on file.  Social History Social History  Substance Use Topics  . Smoking status: Current Some Day Smoker  . Smokeless tobacco: None  . Alcohol Use: No    Review of Systems Constitutional: No fever/chills Eyes: No visual changes. ENT: No sore throat. Cardiovascular: As above Respiratory: As above Gastrointestinal: No abdominal pain.  No nausea, no vomiting.  No diarrhea.  No constipation. Genitourinary: Negative for dysuria. Musculoskeletal: Negative for back pain. Skin: Negative for rash. Neurological: Negative for headaches, focal weakness or numbness.  10-point ROS otherwise negative.  ____________________________________________   PHYSICAL EXAM:  VITAL SIGNS: ED Triage Vitals  Enc Vitals Group     BP 04/28/15 1146 139/73 mmHg     Pulse Rate 04/28/15 1146 54     Resp 04/28/15 1146 18     Temp 04/28/15 1146 97.8 F (36.6 C)     Temp Source 04/28/15 1146 Oral  SpO2 04/28/15 1146 100 %     Weight 04/28/15 1146 220 lb (99.791 kg)     Height 04/28/15 1146  (1.778 m)     Head Cir --      Peak Flow --      Pain Score 04/28/15 1146 6     Pain Loc --      Pain Edu? --      Excl. in GC? --     Constitutional: Alert and oriented. Well appearing and in no acute distress. Eyes: Conjunctivae are normal. PERRL. EOMI. Head: Atraumatic. Nose: No congestion/rhinnorhea. Mouth/Throat: Mucous membranes are moist.  Oropharynx non-erythematous. Neck: No stridor.   Cardiovascular: Normal rate, regular rhythm. Grossly normal heart sounds.  Good peripheral circulation. Respiratory: Normal respiratory effort.  No retractions. Lungs CTAB. Gastrointestinal: Soft and nontender. No distention. No abdominal bruits. No CVA tenderness. Musculoskeletal: No lower extremity tenderness nor edema.   No joint effusions. Neurologic:  Normal speech and language. No gross focal neurologic deficits are appreciated. No gait instability. Skin:  Skin is warm, dry and intact. No rash noted. Psychiatric: Mood and affect are normal. Speech and behavior are normal.  ____________________________________________   LABS (all labs ordered are listed, but only abnormal results are displayed)  Labs Reviewed  BASIC METABOLIC PANEL  CBC  TROPONIN I   ____________________________________________  EKG  ED ECG REPORT I, Nora Rooke,  Teena Irani, the attending physician, personally viewed and interpreted this ECG.   Date: 04/28/2015  EKG Time: 1145  Rate: 56  Rhythm: sinus bradycardia  Axis: Normal axis  Intervals:none  ST&T Change: No ST segments elevated or depressed. No abnormal T-wave inversions.  ____________________________________________  RADIOLOGY  No active cardiopulmonary disease. I personally reviewed the images. ____________________________________________   PROCEDURES    ____________________________________________   INITIAL IMPRESSION / ASSESSMENT AND PLAN / ED COURSE  Pertinent labs & imaging results that were available during my care of the patient were reviewed by me and considered in my medical decision making (see chart for details).  ----------------------------------------- 2:14 PM on 04/28/2015 -----------------------------------------  Patient pain-free after aspirin and nitroglycerin. Patient with a heart score of 4. We'll admit the patient to the hospital for further workup. Signed out to Dr. Judithann Sheen. Patient as well as his mother who are in the room are aware of the lab results and the plan and are willing to comply. ____________________________________________   FINAL CLINICAL IMPRESSION(S) / ED DIAGNOSES  Acute chest pain. Initial visit.    Myrna Blazer, MD 04/28/15 [phone]

## 2015-04-28 NOTE — ED Notes (Signed)
Pt began last night having central chest pain, pain worsened this am, pt reports some sob with this pain and radiation of pain to left arm.

## 2015-04-28 NOTE — Progress Notes (Signed)
Skin WDL on admission - 2nd RN Crystal M.

## 2015-04-28 NOTE — Progress Notes (Signed)
*  PRELIMINARY RESULTS* Echocardiogram 2D Echocardiogram has been performed.  Roy Norton 04/28/2015, 5:30 PM

## 2015-04-28 NOTE — ED Notes (Signed)
Pt reporting sharp pains in left shoulder and arm at this time. Pt is wincing and reporting sudden onset. MD notified.

## 2015-04-28 NOTE — ED Notes (Signed)
Upon returning to room pt reports to RN that pain has subsided and pt no longer wants pain medication. Pt refused nitro.

## 2015-04-28 NOTE — ED Notes (Signed)
Attempted to call report to floor. Told by floor that RN was not answering phone. Will call again shortly.

## 2015-04-29 LAB — CBC
HEMATOCRIT: 40.6 % (ref 40.0–52.0)
HEMOGLOBIN: 13.8 g/dL (ref 13.0–18.0)
MCH: 31.6 pg (ref 26.0–34.0)
MCHC: 34 g/dL (ref 32.0–36.0)
MCV: 93 fL (ref 80.0–100.0)
Platelets: 167 10*3/uL (ref 150–440)
RBC: 4.37 MIL/uL — ABNORMAL LOW (ref 4.40–5.90)
RDW: 13.7 % (ref 11.5–14.5)
WBC: 6.5 10*3/uL (ref 3.8–10.6)

## 2015-04-29 LAB — BASIC METABOLIC PANEL
ANION GAP: 6 (ref 5–15)
BUN: 17 mg/dL (ref 6–20)
CALCIUM: 9 mg/dL (ref 8.9–10.3)
CHLORIDE: 107 mmol/L (ref 101–111)
CO2: 26 mmol/L (ref 22–32)
Creatinine, Ser: 0.96 mg/dL (ref 0.61–1.24)
GFR calc Af Amer: >60 mL/min (ref >60)
GFR calc non Af Amer: >60 mL/min (ref >60)
GLUCOSE: 117 mg/dL — AB (ref 65–99)
Potassium: 3.8 mmol/L (ref 3.5–5.1)
Sodium: 139 mmol/L (ref 135–145)

## 2015-04-29 LAB — GLUCOSE, CAPILLARY
GLUCOSE-CAPILLARY: 116 mg/dL — AB (ref 65–99)
GLUCOSE-CAPILLARY: 97 mg/dL (ref 65–99)
Glucose-Capillary: 120 mg/dL — ABNORMAL HIGH (ref 65–99)
Glucose-Capillary: 134 mg/dL — ABNORMAL HIGH (ref 65–99)

## 2015-04-29 LAB — FIBRIN DERIVATIVES D-DIMER (ARMC ONLY): Fibrin derivatives D-dimer (ARMC): 258 (ref 0–499)

## 2015-04-29 MED ORDER — NICOTINE 21 MG/24HR TD PT24
21.0000 mg | MEDICATED_PATCH | Freq: Every day | TRANSDERMAL | Status: DC
  Administered 2015-04-29 – 2015-04-30 (×2): 21 mg via TRANSDERMAL
  Filled 2015-04-29 (×2): qty 1

## 2015-04-29 MED ORDER — TRAZODONE HCL 50 MG PO TABS
50.0000 mg | ORAL_TABLET | Freq: Once | ORAL | Status: AC
  Administered 2015-04-30: 50 mg via ORAL
  Filled 2015-04-29: qty 1

## 2015-04-29 MED ORDER — NICOTINE 10 MG IN INHA: 1.0000 | RESPIRATORY_TRACT | Status: DC | PRN

## 2015-04-29 NOTE — Consult Note (Signed)
[facility] Cardiology  CARDIOLOGY CONSULT NOTE  Patient ID: YAO HYPPOLITE MRN: [id number] DOB/AGE: [birth year] 54 y.o.  Admit date: 04/28/2015 Referring Physician Aletha Halim M.D. Primary Physician  Primary Cardiologist  Reason for Consultation chest pain  HPI: The patient is a 54 year old gentleman with multiple cardiovascular risk factors including hypertension, hyperlipidemia and diabetes. He presented to [facility] emergency room on an episode of chest pain substernal in location, with radiation to his left arm, associated with nausea, some breath and diaphoresis which lasted approximately 30 minutes. The patient reports a similar episode approximately a year ago. He apparently underwent cardiac catheterization at [facility] which revealed insignificant coronary artery disease. EKG revealed sinus rhythm, normal ECG. Initial troponin is negative.  Review of systems complete and found to be negative unless listed above     Past Medical History  Diagnosis Date  . Hypertension   . GERD (gastroesophageal reflux disease)   . Diabetes mellitus without complication     History reviewed. No pertinent past surgical history.  Prescriptions prior to admission  Medication Sig Dispense Refill Last Dose  . aspirin 81 MG chewable tablet Chew 81 mg by mouth daily.   04/28/2015 at Unknown time  . lovastatin (MEVACOR) 40 MG tablet Take 40 mg by mouth every morning. With dinner   04/28/2015 at Unknown time  . metoprolol succinate (TOPROL-XL) 25 MG 24 hr tablet Take 25 mg by mouth daily.   04/28/2015 at 0800  . naproxen (NAPROSYN) 250 MG tablet Take 1 tablet (250 mg total) by mouth 2 (two) times daily with a meal. 40 tablet 0 04/28/2015 at Unknown time  . ondansetron (ZOFRAN ODT) 8 MG disintegrating tablet Take 1 tablet (8 mg total) by mouth every 8 (eight) hours as needed for nausea or vomiting. 20 tablet 0 Past Month at Unknown time  . oxyCODONE (OXY IR/ROXICODONE) 5 MG immediate release tablet Take 5 mg by mouth  every 6 (six) hours as needed. For pain.   Past Week at Unknown time  . topiramate (TOPAMAX) 50 MG tablet Take 50 mg by mouth 2 (two) times daily.   04/28/2015 at Unknown time   Social History   Social History  . Marital Status: Single    Spouse Name: N/A  . Number of Children: N/A  . Years of Education: N/A   Occupational History  . Not on file.   Social History Main Topics  . Smoking status: Current Some Day Smoker  . Smokeless tobacco: Not on file  . Alcohol Use: No  . Drug Use: No  . Sexual Activity: Not on file   Other Topics Concern  . Not on file   Social History Narrative    History reviewed. No pertinent family history.    Review of systems complete and found to be negative unless listed above      PHYSICAL EXAM  General: Well developed, well nourished, in no acute distress HEENT:  Normocephalic and atramatic Neck:  No JVD.  Lungs: Clear bilaterally to auscultation and percussion. Heart: HRRR . Normal S1 and S2 without gallops or murmurs.  Abdomen: Bowel sounds are positive, abdomen soft and non-tender  Msk:  Back normal, normal gait. Normal strength and tone for age. Extremities: No clubbing, cyanosis or edema.   Neuro: Alert and oriented X 3. Psych:  Good affect, responds appropriately  Labs:   Lab Results  Component Value Date   WBC 6.5 04/29/2015   HGB 13.8 04/29/2015   HCT 40.6 04/29/2015   MCV 93.0  04/29/2015   PLT 167 04/29/2015    Recent Labs Lab 04/29/15 0520  NA 139  K 3.8  CL 107  CO2 26  BUN 17  CREATININE 0.96  CALCIUM 9.0  GLUCOSE 117*   Lab Results  Component Value Date   TROPONINI <0.03 04/28/2015    Lab Results  Component Value Date   CHOL 161 04/16/2014   Lab Results  Component Value Date   HDL 24* 04/16/2014   Lab Results  Component Value Date   LDLCALC SEE COMMENT 04/16/2014   Lab Results  Component Value Date   TRIG 410* 04/16/2014   No results found for: CHOLHDL No results found for: LDLDIRECT     Radiology: Dg Chest 2 View  04/28/2015   CLINICAL DATA:  Chest pain for 1 day with shortness breath.  EXAM: CHEST  2 VIEW  COMPARISON:  08/03/2014 and prior chest radiographs dating back to 09/14/2011  FINDINGS: The cardiomediastinal silhouette is unremarkable.  There is no evidence of focal airspace disease, pulmonary edema, suspicious pulmonary nodule/mass, pleural effusion, or pneumothorax. No acute bony abnormalities are identified.  IMPRESSION: No active cardiopulmonary disease.   Electronically Signed   By: Harmon Pier M.D.   On: 04/28/2015 12:38   Ct Head Wo Contrast  04/26/2015   CLINICAL DATA:  New onset of dizziness, pre syncopal episodes  EXAM: CT HEAD WITHOUT CONTRAST  TECHNIQUE: Contiguous axial images were obtained from the base of the skull through the vertex without intravenous contrast.  COMPARISON:  CT brain scan of 12/20/2012  FINDINGS: The ventricular system is unchanged in size and configuration, and the septum is midline in position. The fourth ventricle and basilar cisterns are unremarkable. No hemorrhage, mass lesion, or acute infarction is seen. On bone window images, no calvarial abnormality is seen. The paranasal sinuses are pneumatized.  IMPRESSION: Negative unenhanced CT of the brain.   Electronically Signed   By: Dwyane Dee M.D.   On: 04/26/2015 14:38    EKG: Normal sinus rhythm, normal ECG  ASSESSMENT AND PLAN:   1. Chest pain, resolved, normal ECG, negative troponin, with history of significant coronary artery disease by cardiac catheterization approximately one year ago.  Recommendations  1. Continue current medications 2. Defer full dose anticoagulation at this time 3. Defer initial repeat cardiac catheterization 4. Lexiscan sestamibi study in a.m. 5. Try to obtain cardiac catheterization results from [facility]  Signed: Niv Darley MD,PhD, [facility] 04/29/2015, 9:29 AM

## 2015-04-29 NOTE — Progress Notes (Addendum)
[facility] - [place] at [facility]   PATIENT NAME: Roy Norton    MR#:  [id number]  DATE OF BIRTH:  05/22/1961  SUBJECTIVE:  CHIEF COMPLAINT:   Chief Complaint  Patient presents with  . Chest Pain   the patient is obese 54 year old Caucasian male with history of  hypertension, hyperlipidemia, diabetes mellitus who presents to the hospital with left-sided to midsternal area chest pain radiating to her left arm associated with shortness of breath, nausea and sweats. Pain was worse whenever patient took deep breath, patient's pain has resolved now. It lasted approximately one hour, but was very intense. Cardiologist saw patient in consultation and felt that patient would benefit from stress test tomorrow morning.   Review of Systems  Constitutional: Negative for fever, chills and weight loss.  HENT: Negative for congestion.   Eyes: Negative for blurred vision and double vision.  Respiratory: Negative for cough, sputum production, shortness of breath and wheezing.   Cardiovascular: Negative for chest pain, palpitations, orthopnea, leg swelling and PND.  Gastrointestinal: Negative for nausea, vomiting, abdominal pain, diarrhea, constipation and blood in stool.  Genitourinary: Negative for dysuria, urgency, frequency and hematuria.  Musculoskeletal: Negative for falls.  Neurological: Negative for dizziness, tremors, focal weakness and headaches.  Endo/Heme/Allergies: Does not bruise/bleed easily.  Psychiatric/Behavioral: Negative for depression. The patient does not have insomnia.     VITAL SIGNS: Blood pressure 132/76, pulse 50, temperature 98 F (36.7 C), temperature source Oral, resp. rate 16, height  (1.778 m), weight 98.158 kg (216 lb 6.4 oz), SpO2 99 %.  PHYSICAL EXAMINATION:   GENERAL:  54 y.o.-year-old obese patient lying in the bed with no acute distress.  EYES: Pupils equal, round, reactive to light and accommodation. No scleral icterus.  Extraocular muscles intact.  HEENT: Head atraumatic, normocephalic. Oropharynx and nasopharynx clear.  NECK:  Supple, no jugular venous distention. No thyroid enlargement, no tenderness.  LUNGS: Normal breath sounds bilaterally, no wheezing, rales,rhonchi or crepitation. No use of accessory muscles of respiration.  CARDIOVASCULAR: S1, S2 normal. No murmurs, rubs, or gallops. Mild discomfort on chest palpation in the midsternal area ABDOMEN: Soft, nontender, nondistended. Bowel sounds present. No organomegaly or mass.  EXTREMITIES: No pedal edema, cyanosis, or clubbing.  NEUROLOGIC: Cranial nerves II through XII are intact. Muscle strength 5/5 in all extremities. Sensation intact. Gait not checked.  PSYCHIATRIC: The patient is alert and oriented x 3.  SKIN: No obvious rash, lesion, or ulcer.   ORDERS/RESULTS REVIEWED:   CBC  Recent Labs Lab 04/28/15 1323 04/29/15 0520  WBC 7.8 6.5  HGB 13.9 13.8  HCT 41.2 40.6  PLT 177 167  MCV 93.0 93.0  MCH 31.5 31.6  MCHC 33.8 34.0  RDW 13.9 13.7   ------------------------------------------------------------------------------------------------------------------  Chemistries   Recent Labs Lab 04/28/15 1323 04/29/15 0520  NA 141 139  K 4.4 3.8  CL 109 107  CO2 26 26  GLUCOSE 77 117*  BUN 15 17  CREATININE 0.81 0.96  CALCIUM 9.2 9.0   ------------------------------------------------------------------------------------------------------------------ estimated creatinine clearance is 104.6 mL/min (by C-G formula based on Cr of 0.96). ------------------------------------------------------------------------------------------------------------------ No results for input(s): TSH, T4TOTAL, T3FREE, THYROIDAB in the last 72 hours.  Invalid input(s): FREET3  Cardiac Enzymes  Recent Labs Lab 04/28/15 1323 04/28/15 1609 04/28/15 2207  TROPONINI <0.03 <0.03 <0.03    ------------------------------------------------------------------------------------------------------------------ Invalid input(s): POCBNP ---------------------------------------------------------------------------------------------------------------  RADIOLOGY: Dg Chest 2 View  04/28/2015   CLINICAL DATA:  Chest pain for 1 day with shortness breath.  EXAM: CHEST  2 VIEW  COMPARISON:  08/03/2014 and prior chest radiographs dating back to 09/14/2011  FINDINGS: The cardiomediastinal silhouette is unremarkable.  There is no evidence of focal airspace disease, pulmonary edema, suspicious pulmonary nodule/mass, pleural effusion, or pneumothorax. No acute bony abnormalities are identified.  IMPRESSION: No active cardiopulmonary disease.   Electronically Signed   By: Harmon Pier M.D.   On: 04/28/2015 12:38    EKG:  Orders placed or performed during the hospital encounter of 04/28/15  . EKG 12-Lead  . EKG 12-Lead  . ED EKG within 10 minutes  . ED EKG within 10 minutes    ASSESSMENT AND PLAN:  Active Problems:   Chest pain at rest 1. Chest pain, midsternal, concerning for unstable angina. Negative cardiac enzymes. No cardiac injury. Continue patient on metoprolol, aspirin, Lovenox and nitroglycerin. Stress test will be done tomorrow morning. Get d-dimer, as patient's pain exacerbated with deep breathing and get CT scan of the chest. If d-dimer is elevated, get oxygen saturations on exertion on room air 2. Hypertension seemed to be relatively well controlled 3. Hyperlipidemia, lipid profile tomorrow morning, continue lovastatin 4. Diabetes mellitus type 2, continue diabetic diet as well as sliding scale insulin. Patient's glucose level is ranging between 116-130, get hemoglobin A1c 5. Obesity. Check TSH, lipid panel as well as hemoglobin A1c as above 6. Tobacco abuse. Discussed this patient for approximately 4 minutes. Nicotine replacement therapy will be initiated    Management plans  discussed with the patient, family and they are in agreement.   DRUG ALLERGIES:  Allergies  Allergen Reactions  . Citalopram Nausea Only  . Omeprazole Nausea Only    CODE STATUS:     Code Status Orders        Start     Ordered   04/28/15 1548  Full code   Continuous     04/28/15 1547      TOTAL TIME TAKING CARE OF THIS PATIENT: 40 minutes.  Evaluation and treatment plan was discussed with care management  Salima Rumer M.D on 04/29/2015 at 2:49 PM  Between 7am to 6pm - Pager - [phone]  After 6pm go to [email or web address] - password [email or web address]  [facility]  Office  [phone]  CC: Primary care physician; No PCP Per Patient

## 2015-04-30 ENCOUNTER — Encounter: Payer: Self-pay | Admitting: Radiology

## 2015-04-30 DIAGNOSIS — E119 Type 2 diabetes mellitus without complications: Secondary | ICD-10-CM

## 2015-04-30 DIAGNOSIS — E669 Obesity, unspecified: Secondary | ICD-10-CM

## 2015-04-30 DIAGNOSIS — I10 Essential (primary) hypertension: Secondary | ICD-10-CM

## 2015-04-30 DIAGNOSIS — Z87891 Personal history of nicotine dependence: Secondary | ICD-10-CM

## 2015-04-30 DIAGNOSIS — E785 Hyperlipidemia, unspecified: Secondary | ICD-10-CM

## 2015-04-30 DIAGNOSIS — E1169 Type 2 diabetes mellitus with other specified complication: Secondary | ICD-10-CM

## 2015-04-30 LAB — GLUCOSE, CAPILLARY
GLUCOSE-CAPILLARY: 118 mg/dL — AB (ref 65–99)
GLUCOSE-CAPILLARY: 155 mg/dL — AB (ref 65–99)
Glucose-Capillary: 147 mg/dL — ABNORMAL HIGH (ref 65–99)

## 2015-04-30 LAB — NM MYOCAR MULTI W/SPECT W/WALL MOTION / EF
CHL CUP NUCLEAR SDS: 0
CHL CUP RESTING HR STRESS: 74 {beats}/min
CSEPPHR: 110 {beats}/min
LV dias vol: 63 mL
LVSYSVOL: 25 mL
SRS: 0
SSS: 2
TID: 0.65

## 2015-04-30 LAB — LIPID PANEL
CHOL/HDL RATIO: 4.8 ratio
Cholesterol: 176 mg/dL (ref 0–200)
HDL: 37 mg/dL — AB (ref >40)
LDL Cholesterol: 104 mg/dL — ABNORMAL HIGH (ref 0–99)
TRIGLYCERIDES: 175 mg/dL — AB (ref <150)
VLDL: 35 mg/dL (ref 0–40)

## 2015-04-30 LAB — TSH: TSH: 1.283 u[IU]/mL (ref 0.350–4.500)

## 2015-04-30 MED ORDER — NICOTINE 10 MG IN INHA: 1.0000 | RESPIRATORY_TRACT | Status: DC | PRN

## 2015-04-30 MED ORDER — TECHNETIUM TC 99M SESTAMIBI - CARDIOLITE
30.0000 | Freq: Once | INTRAVENOUS | Status: AC | PRN
  Administered 2015-04-30: 13:00:00 30.47 via INTRAVENOUS

## 2015-04-30 MED ORDER — REGADENOSON 0.4 MG/5ML IV SOLN
0.4000 mg | Freq: Once | INTRAVENOUS | Status: AC
  Administered 2015-04-30: 0.4 mg via INTRAVENOUS

## 2015-04-30 MED ORDER — TECHNETIUM TC 99M SESTAMIBI - CARDIOLITE
10.0000 | Freq: Once | INTRAVENOUS | Status: AC | PRN
  Administered 2015-04-30: 12:00:00 13.68 via INTRAVENOUS

## 2015-04-30 MED ORDER — NICOTINE 21 MG/24HR TD PT24: 21.0000 mg | MEDICATED_PATCH | Freq: Every day | TRANSDERMAL | Status: DC

## 2015-04-30 NOTE — Care Management Note (Signed)
Case Management Note  Patient Details  Name: Roy Norton MRN: [id number] Date of Birth: 07-10-1961  Subjective/Objective:          Provided pt. With DSS for [employer], as pt. Wants to apply to disability. He does go to [facility] in the same area for PCP care and meds regularly.  Has no more questions, but is anxious to go home. I have advised his nurse Cammy Copa of this.          Action/Plan:   Expected Discharge Date:                  Expected Discharge Plan:     In-House Referral:     Discharge planning Services     Post Acute Care Choice:    Choice offered to:     DME Arranged:    DME Agency:     HH Arranged:    HH Agency:     Status of Service:     Medicare Important Message Given:    Date Medicare IM Given:    Medicare IM give by:    Date Additional Medicare IM Given:    Additional Medicare Important Message give by:     If discussed at Long Length of Stay Meetings, dates discussed:    Additional Comments:  Berna Bue, RN 04/30/2015, 3:15 PM

## 2015-04-30 NOTE — Progress Notes (Signed)
Patient is discharge home in a stable condition, denies pain at time of discharge, summary given verbalized understanding , left with his mother

## 2015-04-30 NOTE — Discharge Summary (Signed)
[facility] [place] at [facility]   PATIENT NAME: Roy Norton    MR#:  [id number]  DATE OF BIRTH:  1961/07/02  DATE OF ADMISSION:  04/28/2015 ADMITTING PHYSICIAN: Marguarite Arbour, MD  DATE OF DISCHARGE: No discharge date for patient encounter.  PRIMARY CARE PHYSICIAN: No PCP Per Patient     ADMISSION DIAGNOSIS:  Chest pain, unspecified chest pain type [R07.9]  DISCHARGE DIAGNOSIS:  Principal Problem:   Chest pain at rest Active Problems:   Tobacco abuse   Hyperlipidemia   Essential hypertension   Diabetes mellitus   Obesity   SECONDARY DIAGNOSIS:   Past Medical History  Diagnosis Date  . Hypertension   . GERD (gastroesophageal reflux disease)   . Diabetes mellitus without complication     .pro HOSPITAL COURSE:   Patient is a 54 year old male with past history significant for history of hypertension, hyperlipidemia, diabetes who presents to the hospital with complaints of chest pains. He admitted of some Combivent of pleuritic chest pain, he was also somewhat uncomfortable on pressure of his chest. He was seen by cardiologist recommended stress test. Nuclear medicine myocardial stress test was performed which showed no ST segment deviation during the stress study was normal. Ejection fraction was also found to be normal, reported by Dr. Gwen Pounds Discussion by problem 1. Chest pain, midsternal, with some complement of pleuritic chest pain could be musculoskeletal. Negative stress test. Negative cardiac enzymes. No cardiac injury. Continue patient on metoprolol, aspirin, normal d-dimer, oxygen saturations are normal 2. Hypertension seemed to be relatively well controlled 3. Hyperlipidemia, lipid profile tomorrow morning, continue lovastatin 4. Diabetes mellitus type 2, continue diabetic diet as well as sliding scale insulin. Patient's glucose level is ranging between 116-130 5. Obesity. TSH is normal, lipid panel was observed with LDL of 104 ,  hemoglobin A1c not done 6. Tobacco abuse. Discussed this patient for approximately 4 minutes. Nicotine replacement therapy will be continued  DISCHARGE CONDITIONS:   Stable  CONSULTS OBTAINED:  Treatment Team:  Marcina Millard, MD  DRUG ALLERGIES:   Allergies  Allergen Reactions  . Citalopram Nausea Only  . Omeprazole Nausea Only    DISCHARGE MEDICATIONS:   Current Discharge Medication List    START taking these medications   Details  nicotine (NICODERM CQ - DOSED IN MG/24 HOURS) 21 mg/24hr patch Place 1 patch (21 mg total) onto the skin daily. Qty: 28 patch, Refills: 0    nicotine (NICOTROL) 10 MG inhaler Inhale 1 cartridge (1 continuous puffing total) into the lungs as needed for smoking cessation. Qty: 42 each, Refills: 0      CONTINUE these medications which have NOT CHANGED   Details  aspirin 81 MG chewable tablet Chew 81 mg by mouth daily.    glipiZIDE (GLUCOTROL) 10 MG tablet Take 10 mg by mouth daily before breakfast.    lovastatin (MEVACOR) 40 MG tablet Take 40 mg by mouth every morning. With dinner    metoprolol succinate (TOPROL-XL) 25 MG 24 hr tablet Take 25 mg by mouth daily.    ondansetron (ZOFRAN ODT) 8 MG disintegrating tablet Take 1 tablet (8 mg total) by mouth every 8 (eight) hours as needed for nausea or vomiting. Qty: 20 tablet, Refills: 0    oxyCODONE (OXY IR/ROXICODONE) 5 MG immediate release tablet Take 5 mg by mouth every 6 (six) hours as needed. For pain.    topiramate (TOPAMAX) 50 MG tablet Take 50 mg by mouth 2 (two) times daily.  STOP taking these medications     naproxen (NAPROSYN) 250 MG tablet          DISCHARGE INSTRUCTIONS:    Patient is to follow-up with his primary care physician  If you experience worsening of your admission symptoms, develop shortness of breath, life threatening emergency, suicidal or homicidal thoughts you must seek medical attention immediately by calling 911 or calling your MD immediately   if symptoms less severe.  You Must read complete instructions/literature along with all the possible adverse reactions/side effects for all the Medicines you take and that have been prescribed to you. Take any new Medicines after you have completely understood and accept all the possible adverse reactions/side effects.   Please note  You were cared for by a hospitalist during your hospital stay. If you have any questions about your discharge medications or the care you received while you were in the hospital after you are discharged, you can call the unit and asked to speak with the hospitalist on call if the hospitalist that took care of you is not available. Once you are discharged, your primary care physician will handle any further medical issues. Please note that NO REFILLS for any discharge medications will be authorized once you are discharged, as it is imperative that you return to your primary care physician (or establish a relationship with a primary care physician if you do not have one) for your aftercare needs so that they can reassess your need for medications and monitor your lab values.    Today   CHIEF COMPLAINT:   Chief Complaint  Patient presents with  . Chest Pain    HISTORY OF PRESENT ILLNESS:  Roy Norton  is a 54 y.o. male with a known history of hypertension, hyperlipidemia, diabetes who presents to the hospital with complaints of chest pains. He admitted of some Combivent of pleuritic chest pain, he was also somewhat uncomfortable on pressure of his chest. He was seen by cardiologist recommended stress test. Nuclear medicine myocardial stress test was performed which showed no ST segment deviation during the stress study was normal. Ejection fraction was also found to be normal, reported by Dr. Gwen Pounds Discussion by problem 1. Chest pain, midsternal, with some complement of pleuritic chest pain could be musculoskeletal. Negative stress test. Negative cardiac  enzymes. No cardiac injury. Continue patient on metoprolol, aspirin, normal d-dimer, oxygen saturations are normal 2. Hypertension seemed to be relatively well controlled 3. Hyperlipidemia, lipid profile tomorrow morning, continue lovastatin 4. Diabetes mellitus type 2, continue diabetic diet as well as sliding scale insulin. Patient's glucose level is ranging between 116-130 5. Obesity. TSH is normal, lipid panel was observed with LDL of 104 , hemoglobin A1c not done 6. Tobacco abuse. Discussed this patient for approximately 4 minutes. Nicotine replacement therapy will be continued   VITAL SIGNS:  Blood pressure 124/80, pulse 80, temperature 97.7 F (36.5 C), temperature source Oral, resp. rate 20, height  (1.778 m), weight 96.299 kg (212 lb 4.8 oz), SpO2 97 %.  I/O:   Intake/Output Summary (Last 24 hours) at 04/30/15 1723 Last data filed at 04/30/15 1500  Gross per 24 hour  Intake    120 ml  Output    100 ml  Net     20 ml    PHYSICAL EXAMINATION:  GENERAL:  54 y.o.-year-old patient lying in the bed with no acute distress.  EYES: Pupils equal, round, reactive to light and accommodation. No scleral icterus. Extraocular muscles intact.  HEENT: Head  atraumatic, normocephalic. Oropharynx and nasopharynx clear.  NECK:  Supple, no jugular venous distention. No thyroid enlargement, no tenderness.  LUNGS: Normal breath sounds bilaterally, no wheezing, rales,rhonchi or crepitation. No use of accessory muscles of respiration.  CARDIOVASCULAR: S1, S2 normal. No murmurs, rubs, or gallops.  ABDOMEN: Soft, non-tender, non-distended. Bowel sounds present. No organomegaly or mass.  EXTREMITIES: No pedal edema, cyanosis, or clubbing.  NEUROLOGIC: Cranial nerves II through XII are intact. Muscle strength 5/5 in all extremities. Sensation intact. Gait not checked.  PSYCHIATRIC: The patient is alert and oriented x 3.  SKIN: No obvious rash, lesion, or ulcer.   DATA REVIEW:   CBC  Recent  Labs Lab 04/29/15 0520  WBC 6.5  HGB 13.8  HCT 40.6  PLT 167    Chemistries   Recent Labs Lab 04/29/15 0520  NA 139  K 3.8  CL 107  CO2 26  GLUCOSE 117*  BUN 17  CREATININE 0.96  CALCIUM 9.0    Cardiac Enzymes  Recent Labs Lab 04/28/15 2207  TROPONINI <0.03    Microbiology Results  No results found for this or any previous visit.  RADIOLOGY:  Nm Myocar Multi W/spect W/wall Motion / Ef  04/30/2015    There was no ST segment deviation noted during stress.  The study is normal.  This is a low risk study.  The left ventricular ejection fraction is normal (55-65%).     EKG:   Orders placed or performed during the hospital encounter of 04/28/15  . EKG 12-Lead  . EKG 12-Lead  . ED EKG within 10 minutes  . ED EKG within 10 minutes      Management plans discussed with the patient, family and they are in agreement.  CODE STATUS:     Code Status Orders        Start     Ordered   04/28/15 1548  Full code   Continuous     04/28/15 1547      TOTAL TIME TAKING CARE OF THIS PATIENT: 40 minutes.    Katharina Caper M.D on 04/30/2015 at 5:23 PM  Between 7am to 6pm - Pager - [phone]  After 6pm go to [email or web address] - password [email or web address] [facility]  [place] [facility]  Office  [phone]  CC: Primary care physician; No PCP Per Patient

## 2015-04-30 NOTE — Progress Notes (Signed)
A&O. Independent. Trazodone given for sleep. No other complaints. On room air. For stress test today. Roy Norton [employer]

## 2015-04-30 NOTE — Clinical Social Work Note (Signed)
CSW spoke to pt.  He was alert and Ox3 when speaking to CSW.  CSw confirmed that pt was interested in applying for disability.  Pt was given directions to DSS office to start the application process.  CSW signing off.

## 2015-05-01 LAB — HEMOGLOBIN A1C: Hgb A1c MFr Bld: 5.8 % (ref 4.0–6.0)

## 2015-08-01 ENCOUNTER — Other Ambulatory Visit: Payer: Self-pay

## 2015-08-01 LAB — TSH: TSH: 0.85 u[IU]/mL (ref 0.41–5.90)

## 2015-08-01 LAB — CBC AND DIFFERENTIAL
HEMATOCRIT: 44 % (ref 41–53)
HEMOGLOBIN: 15.2 g/dL (ref 13.5–17.5)
NEUTROS ABS: 4 /uL
PLATELETS: 196 10*3/uL (ref 150–399)
WBC: 7.7 10^3/mL

## 2015-08-01 LAB — LIPID PANEL
CHOLESTEROL: 147 mg/dL (ref 0–200)
HDL: 41 mg/dL (ref 35–70)
LDL CALC: 83 mg/dL
TRIGLYCERIDES: 113 mg/dL (ref 40–160)

## 2015-08-01 LAB — BASIC METABOLIC PANEL
BUN: 14 mg/dL (ref 4–21)
Creatinine: 0.8 mg/dL (ref 0.6–1.3)
Glucose: 94 mg/dL
Potassium: 4.4 mmol/L (ref 3.4–5.3)
SODIUM: 141 mmol/L (ref 137–147)

## 2015-08-01 LAB — HEPATIC FUNCTION PANEL
ALK PHOS: 59 U/L (ref 25–125)
ALT: 26 U/L (ref 10–40)
AST: 22 U/L (ref 14–40)
Bilirubin, Total: 0.4 mg/dL

## 2015-08-01 LAB — HEMOGLOBIN A1C: Hemoglobin A1C: 5.9

## 2015-08-08 ENCOUNTER — Ambulatory Visit: Payer: Self-pay

## 2015-09-05 ENCOUNTER — Ambulatory Visit: Payer: Self-pay | Admitting: Ophthalmology

## 2015-09-05 LAB — HM DIABETES EYE EXAM

## 2015-09-06 LAB — HM DIABETES EYE EXAM

## 2015-10-31 DIAGNOSIS — R42 Dizziness and giddiness: Secondary | ICD-10-CM

## 2015-12-05 ENCOUNTER — Other Ambulatory Visit: Payer: Self-pay

## 2015-12-05 DIAGNOSIS — E119 Type 2 diabetes mellitus without complications: Secondary | ICD-10-CM

## 2015-12-06 LAB — COMPREHENSIVE METABOLIC PANEL
ALBUMIN: 4.5 g/dL (ref 3.5–5.5)
ALT: 32 IU/L (ref 0–44)
AST: 18 IU/L (ref 0–40)
Albumin/Globulin Ratio: 1.6 (ref 1.2–2.2)
Alkaline Phosphatase: 67 IU/L (ref 39–117)
BUN / CREAT RATIO: 14 (ref 9–20)
BUN: 12 mg/dL (ref 6–24)
Bilirubin Total: 0.3 mg/dL (ref 0.0–1.2)
CALCIUM: 9.2 mg/dL (ref 8.7–10.2)
CO2: 21 mmol/L (ref 18–29)
CREATININE: 0.83 mg/dL (ref 0.76–1.27)
Chloride: 100 mmol/L (ref 96–106)
GFR, EST AFRICAN AMERICAN: 116 mL/min/{1.73_m2} (ref >59)
GFR, EST NON AFRICAN AMERICAN: 100 mL/min/{1.73_m2} (ref >59)
GLOBULIN, TOTAL: 2.9 g/dL (ref 1.5–4.5)
Glucose: 111 mg/dL — ABNORMAL HIGH (ref 65–99)
Potassium: 4.5 mmol/L (ref 3.5–5.2)
SODIUM: 140 mmol/L (ref 134–144)
TOTAL PROTEIN: 7.4 g/dL (ref 6.0–8.5)

## 2015-12-06 LAB — CBC WITH DIFFERENTIAL/PLATELET
BASOS ABS: 0 10*3/uL (ref 0.0–0.2)
Basos: 0 %
EOS (ABSOLUTE): 0.1 10*3/uL (ref 0.0–0.4)
EOS: 1 %
HEMATOCRIT: 46.1 % (ref 37.5–51.0)
HEMOGLOBIN: 15.6 g/dL (ref 12.6–17.7)
IMMATURE GRANS (ABS): 0 10*3/uL (ref 0.0–0.1)
Immature Granulocytes: 0 %
LYMPHS ABS: 2.7 10*3/uL (ref 0.7–3.1)
LYMPHS: 37 %
MCH: 31.2 pg (ref 26.6–33.0)
MCHC: 33.8 g/dL (ref 31.5–35.7)
MCV: 92 fL (ref 79–97)
MONOCYTES: 5 %
Monocytes Absolute: 0.4 10*3/uL (ref 0.1–0.9)
NEUTROS ABS: 4.1 10*3/uL (ref 1.4–7.0)
Neutrophils: 57 %
Platelets: 218 10*3/uL (ref 150–379)
RBC: 5 x10E6/uL (ref 4.14–5.80)
RDW: 14.3 % (ref 12.3–15.4)
WBC: 7.3 10*3/uL (ref 3.4–10.8)

## 2015-12-06 LAB — LIPID PANEL
CHOL/HDL RATIO: 4.2 ratio (ref 0.0–5.0)
CHOLESTEROL TOTAL: 171 mg/dL (ref 100–199)
HDL: 41 mg/dL (ref >39)
LDL CALC: 93 mg/dL (ref 0–99)
TRIGLYCERIDES: 186 mg/dL — AB (ref 0–149)
VLDL CHOLESTEROL CAL: 37 mg/dL (ref 5–40)

## 2015-12-06 LAB — TSH: TSH: 1.61 u[IU]/mL (ref 0.450–4.500)

## 2015-12-06 LAB — HEMOGLOBIN A1C
ESTIMATED AVERAGE GLUCOSE: 123 mg/dL
Hgb A1c MFr Bld: 5.9 % — ABNORMAL HIGH (ref 4.8–5.6)

## 2015-12-12 ENCOUNTER — Ambulatory Visit: Payer: Self-pay | Admitting: Family Medicine

## 2015-12-12 VITALS — BP 117/71 | HR 106 | Resp 16 | Wt 221.0 lb

## 2015-12-12 DIAGNOSIS — I10 Essential (primary) hypertension: Secondary | ICD-10-CM

## 2015-12-12 DIAGNOSIS — E118 Type 2 diabetes mellitus with unspecified complications: Secondary | ICD-10-CM

## 2015-12-12 DIAGNOSIS — G43009 Migraine without aura, not intractable, without status migrainosus: Secondary | ICD-10-CM

## 2015-12-12 DIAGNOSIS — E785 Hyperlipidemia, unspecified: Secondary | ICD-10-CM

## 2015-12-12 DIAGNOSIS — G43909 Migraine, unspecified, not intractable, without status migrainosus: Secondary | ICD-10-CM | POA: Insufficient documentation

## 2015-12-12 LAB — GLUCOSE, POCT (MANUAL RESULT ENTRY): POC GLUCOSE: 210 mg/dL — AB (ref 70–99)

## 2015-12-12 MED ORDER — AMITRIPTYLINE HCL 25 MG PO TABS: 25.0000 mg | ORAL_TABLET | Freq: Every day | ORAL | Status: DC

## 2015-12-12 NOTE — Assessment & Plan Note (Signed)
Under good control. Continue current regimen. Continue to monitor. Call with any concerns. Several refills available today so no refills given.

## 2015-12-12 NOTE — Assessment & Plan Note (Signed)
Under good control. Continue current regimen. Continue to monitor. Call with any concerns. Several refills available today so no refills given.  

## 2015-12-12 NOTE — Progress Notes (Signed)
Capillary Blood Glucose = 210

## 2015-12-12 NOTE — Assessment & Plan Note (Signed)
Not under good control. Will stop topamax, Continue metoprolol. Start amitriptyline 25mg  qHS. Recheck 1 month to see how he's doing.

## 2015-12-12 NOTE — Progress Notes (Signed)
BP 117/71 mmHg  Pulse 106  Resp 16  Wt 221 lb (100.245 kg)  SpO2 96%   Subjective:    Patient ID: Roy Norton, male    DOB: Apr 20, 1961, 55 y.o.   MRN: [id number]  HPI: Roy Norton is a 55 y.o. male  Chief Complaint  Patient presents with  . Headache   MIGRAINES- was on higher dose of the topamax, but had to go on a lower dose- not sure what medicine he has been on in the past, no benefit from the topamax Duration: chronic Onset: sudden Severity: severe Quality: sharp and sore Frequency: once a month  Location: back of the head to behind the L eye Headache duration: 3 days Radiation: no Time of day headache occurs: variable Alleviating factors: tylenol, nothing Aggravating factors: nothing Headache status at time of visit: current headache Treatments attempted: Treatments attempted: APAP and topamax   Aura: no Nausea:  no Vomiting: no Photophobia: yes Phonophobia:  yes Effect on social functioning:  no Confusion:  no Gait disturbance/ataxia:  no Behavioral changes:  no Fevers:  no  DIABETES A1c: 5.9 Hypoglycemic episodes:no Polydipsia/polyuria: yes Visual disturbance: no Chest pain: no Paresthesias: no Glucose Monitoring: yes  Accucheck frequency: 1x a week Taking Insulin?: no Blood Pressure Monitoring: not checking Retinal Examination: Up to Date Foot Exam: Not up to Date Aspirin: yes  HYPERTENSION / HYPERLIPIDEMIA Satisfied with current treatment? yes Duration of hypertension: chronic BP monitoring frequency: not checking BP medication side effects: no Duration of hyperlipidemia: chronic Cholesterol medication side effects: no Cholesterol supplements: none Past cholesterol medications: atorvastain (lipitor) and lovastatin (mevacor) Medication compliance: excellent compliance Aspirin: no Recent stressors: no Recurrent headaches: no Visual changes: no Palpitations: no Dyspnea: no Chest pain: no Lower extremity edema:  no Dizzy/lightheaded: no  Relevant past medical, surgical, family and social history reviewed and updated as indicated. Interim medical history since our last visit reviewed. Allergies and medications reviewed and updated.  Review of Systems  Constitutional: Negative.   Respiratory: Negative.   Cardiovascular: Negative.   Neurological: Positive for headaches.  Psychiatric/Behavioral: Negative.     Per HPI unless specifically indicated above     Objective:    BP 117/71 mmHg  Pulse 106  Resp 16  Wt 221 lb (100.245 kg)  SpO2 96%  Wt Readings from Last 3 Encounters:  12/12/15 221 lb (100.245 kg)  08/08/15 223 lb (101.152 kg)  04/30/15 212 lb 4.8 oz (96.299 kg)    Physical Exam  Constitutional: He is oriented to person, place, and time. He appears well-developed and well-nourished. No distress.  HENT:  Head: Normocephalic and atraumatic.  Right Ear: Hearing normal.  Left Ear: Hearing normal.  Nose: Nose normal.  Eyes: Conjunctivae and lids are normal. Right eye exhibits no discharge. Left eye exhibits no discharge. No scleral icterus.  Cardiovascular: Normal rate, regular rhythm, normal heart sounds and intact distal pulses.  Exam reveals no gallop and no friction rub.   No murmur heard. Pulmonary/Chest: Effort normal and breath sounds normal. No respiratory distress. He has no wheezes. He has no rales. He exhibits no tenderness.  Musculoskeletal: Normal range of motion.  Neurological: He is alert and oriented to person, place, and time.  Skin: Skin is warm, dry and intact. No rash noted. No erythema. No pallor.  Psychiatric: He has a normal mood and affect. His speech is normal and behavior is normal. Judgment and thought content normal. Cognition and memory are normal.  Nursing note and vitals reviewed.  Results for orders placed or performed in visit on 12/12/15  POCT Glucose (CBG)  Result Value Ref Range   POC Glucose 210 (A) 70 - 99 mg/dl  HM DIABETES EYE EXAM   Result Value Ref Range   HM Diabetic Eye Exam No Retinopathy No Retinopathy      Assessment & Plan:   Problem List Items Addressed This Visit      Cardiovascular and Mediastinum   Essential hypertension    Under good control. Continue current regimen. Continue to monitor. Call with any concerns. Several refills available today so no refills given.       Migraine    Not under good control. Will stop topamax, Continue metoprolol. Start amitriptyline 25mg  qHS. Recheck 1 month to see how he's doing.      Relevant Medications   gabapentin (NEURONTIN) 300 MG capsule   amitriptyline (ELAVIL) 25 MG tablet     Endocrine   Diabetes mellitus (HCC) - Primary    Under good control. Continue current regimen. Continue to monitor. Call with any concerns. Several refills available today so no refills given.       Relevant Orders   POCT Glucose (CBG) (Completed)     Other   Hyperlipidemia    Under good control. Continue current regimen. Continue to monitor. Call with any concerns. Several refills available today so no refills given.           Follow up plan: Return in about 4 weeks (around 01/09/2016) for follow up change in medication on migraines.

## 2016-01-06 IMAGING — CR DG CHEST 2V
1 series · 2 of 2 positions shown · non-contrast
Comparison: 12/20/2012

CLINICAL DATA: cough / chest pain for 1 week, no fever

EXAM:
CHEST - 2 VIEW

[Series 1: dxr chest pa (or ap) and lateral · 0.14mm/px · 2 of 2 slices shown]
[im 1/2]
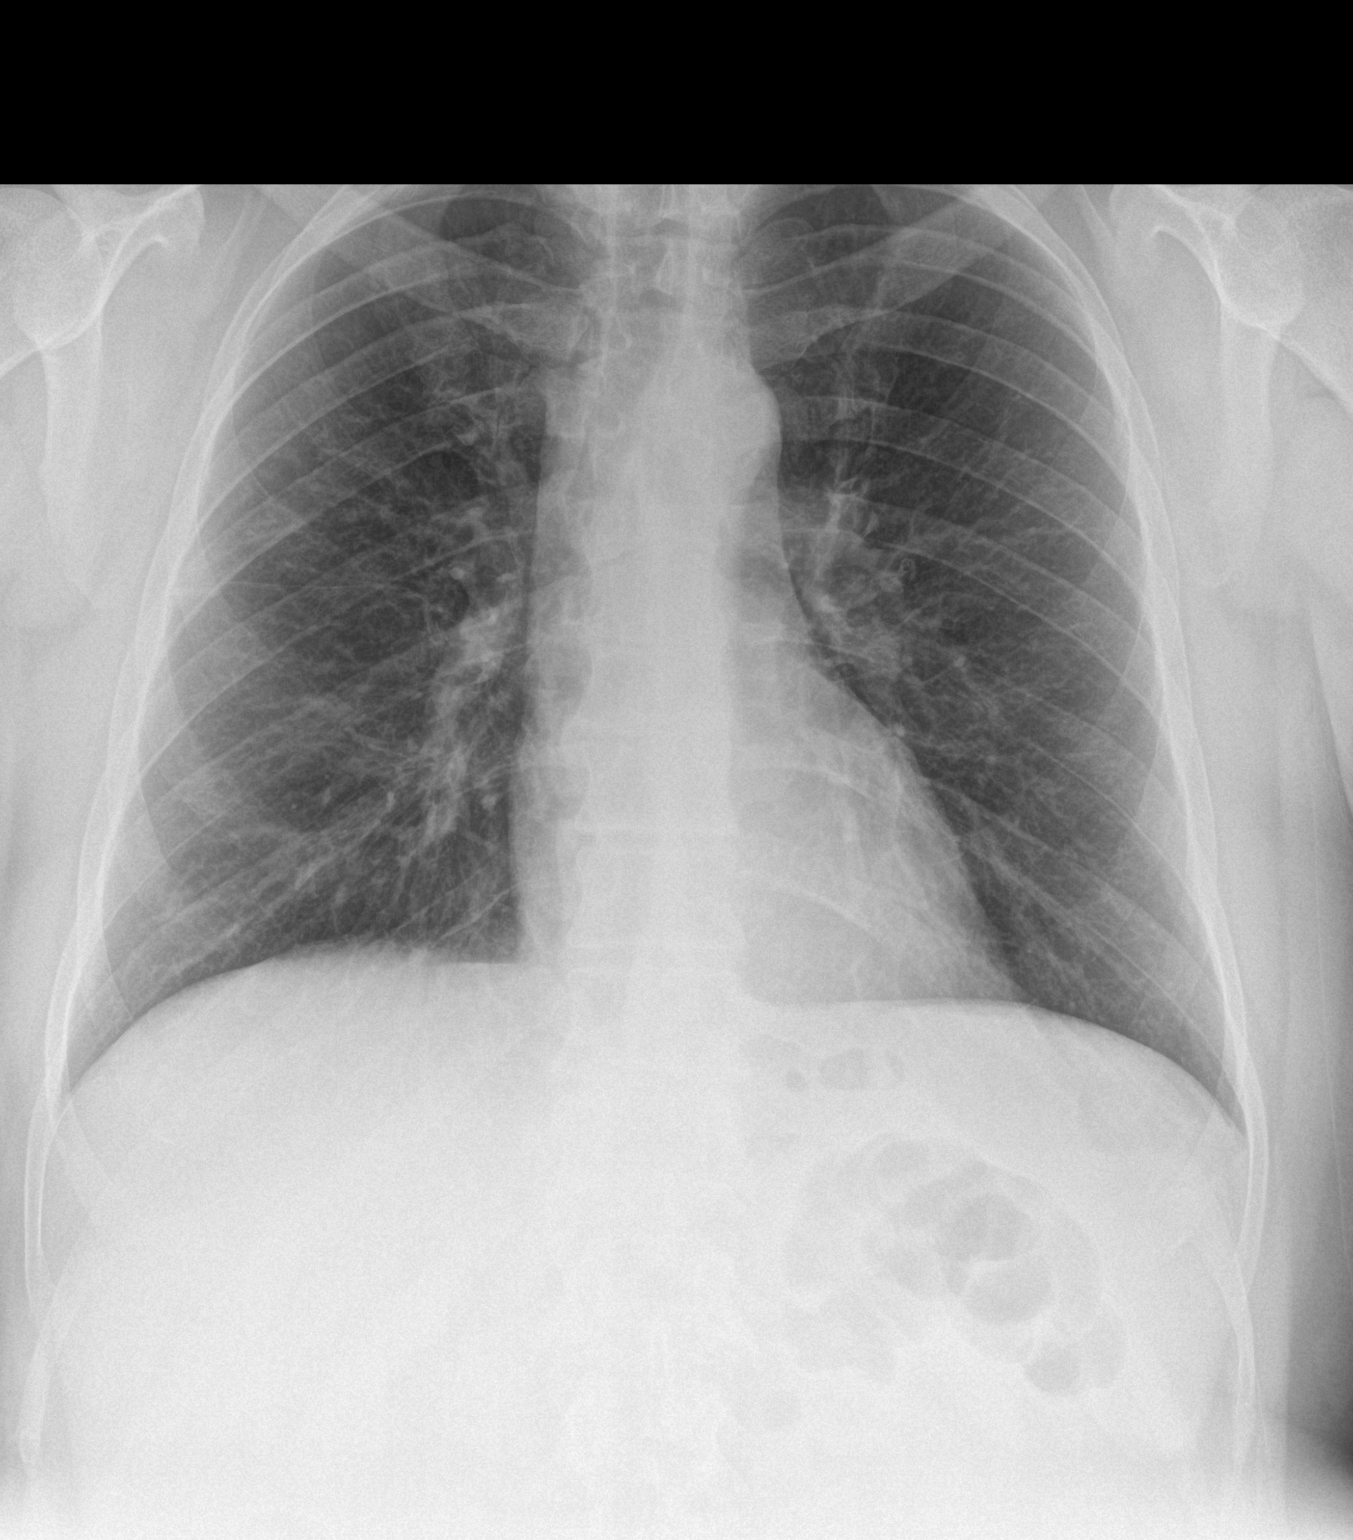
[im 2/2]
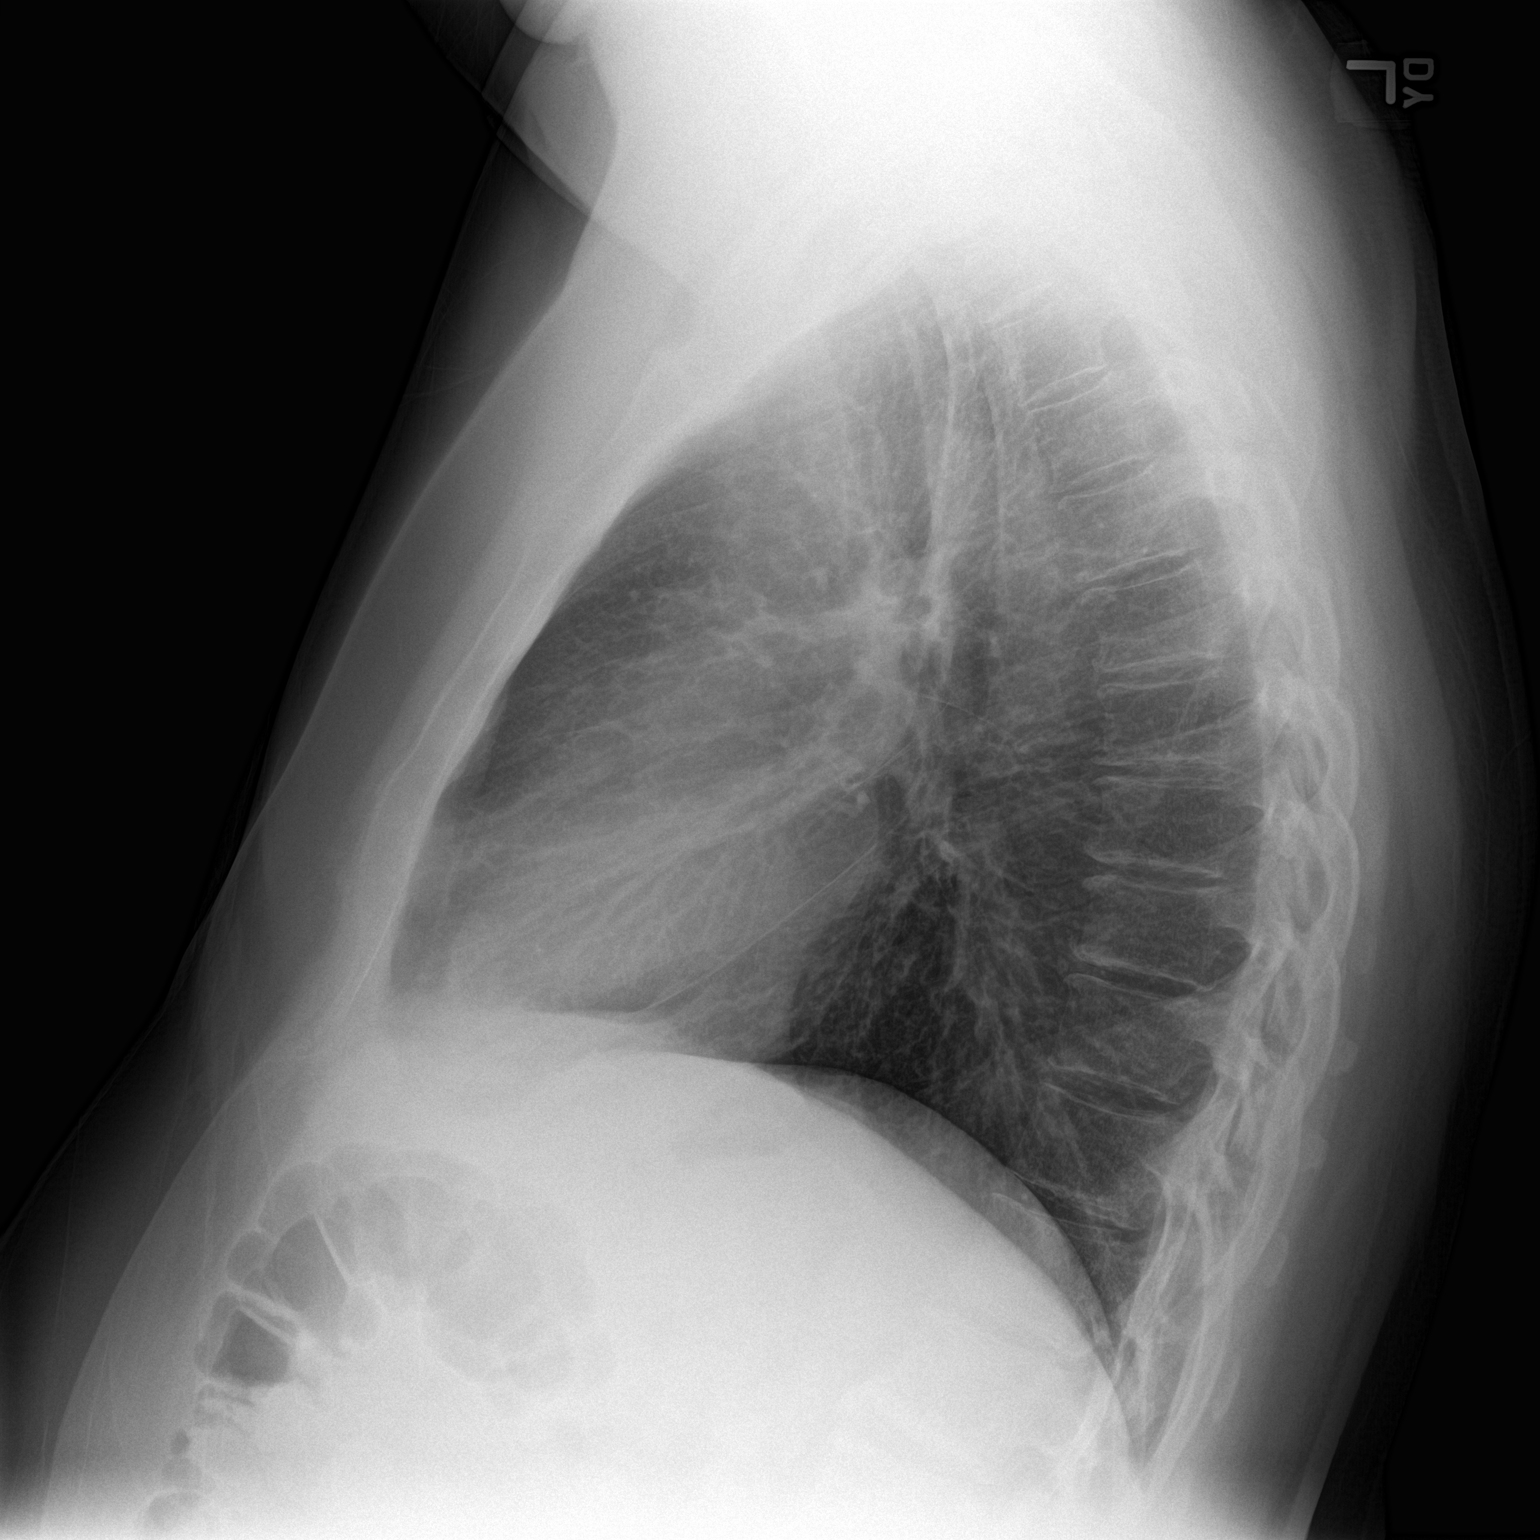

[2 of 2 positions shown; findings below may reference images not displayed]

FINDINGS: Somewhat coarse prominent perihilar interstitial markings. No
confluent airspace consolidation or overt edema. Heart size remains
normal.
No effusion.
Visualized skeletal structures are unremarkable.
IMPRESSION: Chronic coarse interstitial markings without acute or superimposed
abnormality.

## 2016-01-16 ENCOUNTER — Ambulatory Visit: Payer: Self-pay | Admitting: Family Medicine

## 2016-01-16 VITALS — BP 157/90 | HR 70 | Resp 16 | Ht 70.0 in | Wt 223.0 lb

## 2016-01-16 DIAGNOSIS — E118 Type 2 diabetes mellitus with unspecified complications: Secondary | ICD-10-CM

## 2016-01-16 DIAGNOSIS — G43009 Migraine without aura, not intractable, without status migrainosus: Secondary | ICD-10-CM

## 2016-01-16 DIAGNOSIS — I10 Essential (primary) hypertension: Secondary | ICD-10-CM

## 2016-01-16 LAB — GLUCOSE, POCT (MANUAL RESULT ENTRY): POC Glucose: 95 mg/dl (ref 70–99)

## 2016-01-16 MED ORDER — AMITRIPTYLINE HCL 50 MG PO TABS: 25.0000 mg | ORAL_TABLET | Freq: Every day | ORAL | Status: DC

## 2016-01-16 MED ORDER — METOPROLOL TARTRATE 25 MG PO TABS: 25.0000 mg | ORAL_TABLET | ORAL | Status: DC

## 2016-01-16 MED ORDER — LISINOPRIL 20 MG PO TABS: 20.0000 mg | ORAL_TABLET | Freq: Every day | ORAL | Status: DC

## 2016-01-16 NOTE — Progress Notes (Signed)
Patient: Roy Norton Male    DOB: 1961-01-31   55 y.o.   MRN: [id number] Visit Date: 01/19/2016  Today's Provider: Lorie Phenix, MD   Chief Complaint  Patient presents with  . Follow-up  . Headache    "comes and goes"   Subjective:    HPI 55 yo male here for recheck of BP and headaches. BP elevated today, Is taking his medication as prescribed. Still getting headaches. Not sure if caused by his blood pressure.       Allergies  Allergen Reactions  . Citalopram Nausea Only  . Omeprazole Nausea Only   Previous Medications   ACETAMINOPHEN (TYLENOL) 500 MG TABLET    Take 500 mg by mouth every 6 (six) hours as needed.   ALBUTEROL (PROVENTIL) (2.5 MG/3ML) 0.083% NEBULIZER SOLUTION    Take 2.5 mg by nebulization every 6 (six) hours as needed for wheezing or shortness of breath.   ASPIRIN 81 MG CHEWABLE TABLET    Chew 81 mg by mouth daily.   ATORVASTATIN (LIPITOR) 40 MG TABLET    Take 40 mg by mouth daily.   GABAPENTIN (NEURONTIN) 300 MG CAPSULE    Take 300 mg by mouth 3 (three) times daily.   GLIPIZIDE (GLUCOTROL) 10 MG TABLET    Take 10 mg by mouth daily before breakfast.   LOVASTATIN (MEVACOR) 40 MG TABLET    Take 40 mg by mouth every morning. Reported on 01/16/2016   METFORMIN (GLUCOPHAGE) 500 MG TABLET    Take 500 mg by mouth 2 (two) times daily with a meal.   OMEPRAZOLE (PRILOSEC) 20 MG CAPSULE    Take 20 mg by mouth daily.   OXYCODONE (OXY IR/ROXICODONE) 5 MG IMMEDIATE RELEASE TABLET    Take 5 mg by mouth every 6 (six) hours as needed. Reported on 01/16/2016    Review of Systems  Constitutional: Negative.   Respiratory: Negative.   Cardiovascular: Negative.   Neurological: Positive for headaches.    Social History  Substance Use Topics  . Smoking status: Former Games developer  . Smokeless tobacco: Not on file  . Alcohol Use: No   Objective:   BP 157/90 mmHg  Pulse 70  Resp 16  Ht  (1.778 m)  Wt 223 lb (101.152 kg)  BMI 32.00 kg/m2  Physical Exam    Constitutional: He is oriented to person, place, and time. He appears well-developed and well-nourished.  Cardiovascular: Normal rate and regular rhythm.   Pulmonary/Chest: Effort normal and breath sounds normal.  Neurological: He is alert and oriented to person, place, and time.  Psychiatric: He has a normal mood and affect. His behavior is normal. Judgment and thought content normal.      Assessment & Plan:     1. Type 2 diabetes mellitus with complication, unspecified long term insulin use status (HCC) Stable.   - POCT Glucose (CBG) Results for orders placed or performed in visit on 01/16/16  POCT Glucose (CBG)  Result Value Ref Range   POC Glucose 95 70 - 99 mg/dl    2. Essential hypertension Will increase Metoprolol to BID. May help BP and headaches.   - lisinopril (PRINIVIL,ZESTRIL) 20 MG tablet; Take 1 tablet (20 mg total) by mouth daily.  Dispense: 90 tablet; Refill: 3 - metoprolol tartrate (LOPRESSOR) 25 MG tablet; Take 1 tablet (25 mg total) by mouth 1 day or 1 dose.  Dispense: 180 tablet; Refill: 3  3. Migraine without aura and without status migrainosus, not intractable  Will increase amitriptyline. Recheck at follow up.   - amitriptyline (ELAVIL) 50 MG tablet; Take 0.5 tablets (25 mg total) by mouth at bedtime.  Dispense: 90 tablet; Refill: 3     Lorie PhenixNancy Jaelynn Pozo, MD  [facility]

## 2016-01-19 MED ORDER — METOPROLOL TARTRATE 25 MG PO TABS: 25.0000 mg | ORAL_TABLET | Freq: Two times a day (BID) | ORAL | Status: DC

## 2016-01-19 MED ORDER — AMITRIPTYLINE HCL 50 MG PO TABS: 50.0000 mg | ORAL_TABLET | Freq: Every day | ORAL | Status: DC

## 2016-04-14 ENCOUNTER — Other Ambulatory Visit: Payer: Self-pay | Admitting: Nurse Practitioner

## 2016-04-16 ENCOUNTER — Other Ambulatory Visit: Payer: Self-pay

## 2016-04-16 ENCOUNTER — Ambulatory Visit: Payer: Self-pay | Admitting: Family Medicine

## 2016-04-16 VITALS — BP 113/74 | HR 95 | Wt 213.5 lb

## 2016-04-16 DIAGNOSIS — I10 Essential (primary) hypertension: Secondary | ICD-10-CM

## 2016-04-16 DIAGNOSIS — G43009 Migraine without aura, not intractable, without status migrainosus: Secondary | ICD-10-CM

## 2016-04-16 DIAGNOSIS — E119 Type 2 diabetes mellitus without complications: Secondary | ICD-10-CM

## 2016-04-16 LAB — GLUCOSE, POCT (MANUAL RESULT ENTRY): POC Glucose: 99 mg/dl (ref 70–99)

## 2016-04-16 MED ORDER — METFORMIN HCL 500 MG PO TABS: ORAL_TABLET | ORAL | 0 refills | Status: DC

## 2016-04-16 MED ORDER — GLIPIZIDE 5 MG PO TABS: 10.0000 mg | ORAL_TABLET | Freq: Every day | ORAL | 1 refills | Status: DC

## 2016-04-16 MED ORDER — ATORVASTATIN CALCIUM 40 MG PO TABS: 40.0000 mg | ORAL_TABLET | Freq: Every day | ORAL | 1 refills | Status: DC

## 2016-04-16 MED ORDER — LISINOPRIL 20 MG PO TABS: 20.0000 mg | ORAL_TABLET | Freq: Every day | ORAL | 3 refills | Status: DC

## 2016-04-16 MED ORDER — OMEPRAZOLE 20 MG PO CPDR: 20.0000 mg | DELAYED_RELEASE_CAPSULE | Freq: Every day | ORAL | 1 refills | Status: DC

## 2016-04-16 MED ORDER — METOPROLOL TARTRATE 25 MG PO TABS: 25.0000 mg | ORAL_TABLET | Freq: Two times a day (BID) | ORAL | 3 refills | Status: DC

## 2016-04-16 MED ORDER — GABAPENTIN 300 MG PO CAPS: 300.0000 mg | ORAL_CAPSULE | Freq: Three times a day (TID) | ORAL | 1 refills | Status: DC

## 2016-04-16 MED ORDER — AMITRIPTYLINE HCL 50 MG PO TABS: 50.0000 mg | ORAL_TABLET | Freq: Every day | ORAL | 3 refills | Status: DC

## 2016-04-16 NOTE — Progress Notes (Signed)
Subjective:     Patient ID: Roy Norton, male   DOB: 09-07-1960, 55 y.o.   MRN: [id number]  HPI  DM-  BS's 100-140 in AM and night. No increased urination/thirst.  No low BS. HTN- BP up and down.  had Cp, comes and goes, takes ASA and goes away, no swelling or SOB. Has heartburn, uses omeprazole daily.   Review of Systems     Objective:   Physical Exam    a+o.  Conj. Clear. RRR, no murmur NT/ND +2 [ulses, no edema  Assessment:    DM HTN     Plan:     Dm.  Stay on current meds except cut down g;lipizide to 5 mg a day. Check A1c and MetB  HTN. Same meds. Check CBC and MetB  Hyperlipidemia. Atorvastatin daily.  RTC in 5 months

## 2016-04-17 LAB — CBC WITH DIFFERENTIAL/PLATELET
BASOS: 0 %
Basophils Absolute: 0 10*3/uL (ref 0.0–0.2)
EOS (ABSOLUTE): 0.1 10*3/uL (ref 0.0–0.4)
Eos: 1 %
HEMATOCRIT: 44.3 % (ref 37.5–51.0)
HEMOGLOBIN: 15.1 g/dL (ref 12.6–17.7)
IMMATURE GRANULOCYTES: 0 %
Immature Grans (Abs): 0 10*3/uL (ref 0.0–0.1)
LYMPHS ABS: 3.4 10*3/uL — AB (ref 0.7–3.1)
Lymphs: 36 %
MCH: 31.3 pg (ref 26.6–33.0)
MCHC: 34.1 g/dL (ref 31.5–35.7)
MCV: 92 fL (ref 79–97)
MONOS ABS: 0.6 10*3/uL (ref 0.1–0.9)
Monocytes: 6 %
NEUTROS PCT: 57 %
Neutrophils Absolute: 5.3 10*3/uL (ref 1.4–7.0)
Platelets: 193 10*3/uL (ref 150–379)
RBC: 4.82 x10E6/uL (ref 4.14–5.80)
RDW: 14.1 % (ref 12.3–15.4)
WBC: 9.5 10*3/uL (ref 3.4–10.8)

## 2016-04-17 LAB — COMPREHENSIVE METABOLIC PANEL
ALBUMIN: 4.6 g/dL (ref 3.5–5.5)
ALT: 31 IU/L (ref 0–44)
AST: 19 IU/L (ref 0–40)
Albumin/Globulin Ratio: 1.5 (ref 1.2–2.2)
Alkaline Phosphatase: 72 IU/L (ref 39–117)
BUN / CREAT RATIO: 24 — AB (ref 9–20)
BUN: 19 mg/dL (ref 6–24)
Bilirubin Total: 0.2 mg/dL (ref 0.0–1.2)
CO2: 25 mmol/L (ref 18–29)
CREATININE: 0.78 mg/dL (ref 0.76–1.27)
Calcium: 9.7 mg/dL (ref 8.7–10.2)
Chloride: 104 mmol/L (ref 96–106)
GFR, EST AFRICAN AMERICAN: 118 mL/min/{1.73_m2} (ref >59)
GFR, EST NON AFRICAN AMERICAN: 102 mL/min/{1.73_m2} (ref >59)
GLOBULIN, TOTAL: 3.1 g/dL (ref 1.5–4.5)
GLUCOSE: 111 mg/dL — AB (ref 65–99)
Potassium: 4.3 mmol/L (ref 3.5–5.2)
Sodium: 144 mmol/L (ref 134–144)
TOTAL PROTEIN: 7.7 g/dL (ref 6.0–8.5)

## 2016-04-17 LAB — HEMOGLOBIN A1C
Est. average glucose Bld gHb Est-mCnc: 120 mg/dL
Hgb A1c MFr Bld: 5.8 % — ABNORMAL HIGH (ref 4.8–5.6)

## 2016-09-03 ENCOUNTER — Ambulatory Visit: Payer: Self-pay | Admitting: Ophthalmology

## 2016-09-03 LAB — HM DIABETES EYE EXAM

## 2016-09-17 ENCOUNTER — Ambulatory Visit: Payer: Self-pay | Admitting: Nurse Practitioner

## 2016-09-17 VITALS — BP 120/67 | HR 69 | Wt 223.6 lb

## 2016-09-17 DIAGNOSIS — E782 Mixed hyperlipidemia: Secondary | ICD-10-CM

## 2016-09-17 DIAGNOSIS — R5383 Other fatigue: Secondary | ICD-10-CM

## 2016-09-17 DIAGNOSIS — I10 Essential (primary) hypertension: Secondary | ICD-10-CM

## 2016-09-17 DIAGNOSIS — G43009 Migraine without aura, not intractable, without status migrainosus: Secondary | ICD-10-CM

## 2016-09-17 DIAGNOSIS — E118 Type 2 diabetes mellitus with unspecified complications: Secondary | ICD-10-CM

## 2016-09-17 LAB — POCT GLUCOSE (DEVICE FOR HOME USE): POC Glucose: 115 mg/dl — AB (ref 70–99)

## 2016-09-17 MED ORDER — GABAPENTIN 300 MG PO CAPS: 300.0000 mg | ORAL_CAPSULE | Freq: Three times a day (TID) | ORAL | 1 refills | Status: DC

## 2016-09-17 MED ORDER — OMEPRAZOLE 20 MG PO CPDR: 20.0000 mg | DELAYED_RELEASE_CAPSULE | Freq: Every day | ORAL | 1 refills | Status: DC

## 2016-09-17 MED ORDER — ATORVASTATIN CALCIUM 40 MG PO TABS: 40.0000 mg | ORAL_TABLET | Freq: Every day | ORAL | 1 refills | Status: DC

## 2016-09-17 MED ORDER — GLIPIZIDE 5 MG PO TABS: 2.5000 mg | ORAL_TABLET | Freq: Every day | ORAL | 1 refills | Status: DC

## 2016-09-17 MED ORDER — AMITRIPTYLINE HCL 50 MG PO TABS: 50.0000 mg | ORAL_TABLET | Freq: Every day | ORAL | 3 refills | Status: DC

## 2016-09-17 MED ORDER — METFORMIN HCL 500 MG PO TABS: ORAL_TABLET | ORAL | 0 refills | Status: DC

## 2016-09-17 MED ORDER — METOPROLOL TARTRATE 25 MG PO TABS: 25.0000 mg | ORAL_TABLET | Freq: Two times a day (BID) | ORAL | 3 refills | Status: DC

## 2016-09-17 MED ORDER — LISINOPRIL 20 MG PO TABS: 20.0000 mg | ORAL_TABLET | Freq: Every day | ORAL | 3 refills | Status: DC

## 2016-09-17 MED ORDER — ASPIRIN 81 MG PO CHEW: 81.0000 mg | CHEWABLE_TABLET | Freq: Every day | ORAL | 1 refills | Status: DC

## 2016-09-17 NOTE — Progress Notes (Signed)
CHECKS HIS BLOOD SUGAR INTERMITTENTLY, DOES REPORT EPISODES OF HYPOGLYCEMIA, WHICH HE IS ABLE TO RECOGNIZE AND EATS AND FEELS BETTER  TAKES GLIPIZIDE 5 MG PER DAY AND METFORMIN 500 MG ONCE A DAY  NO TOBACCO, NO ETOH   EXAM: ALERT, VERBALLY APPROPRIATE, NAD NO ADENOPATHY, NO THYROMEGALY, NO CAROTID BRUITS AP RRR BBrS CLEAR NO LOWER EXTREMITY EDEMA   PLAN:  WITH EPISODES OF LIKELY HYPOGLYCEMIA AND LAST A1c  OF 5.8. WILL DECREASE  GLIPIZIDE TO 1/2 OF A 5 MG TABLET OR 2.5 MG BUT CONTINUE THE METFORMIN.  .   WILL ALSO CHECK ALL LABS TODAY  BP IS A LITTLE LOW, WILL CONTINUE TO MONITOR

## 2016-09-23 ENCOUNTER — Other Ambulatory Visit: Payer: Self-pay | Admitting: Ophthalmology

## 2016-12-10 ENCOUNTER — Other Ambulatory Visit: Payer: Self-pay

## 2016-12-10 DIAGNOSIS — Z0000 Encounter for general adult medical examination without abnormal findings: Secondary | ICD-10-CM

## 2016-12-11 LAB — COMPREHENSIVE METABOLIC PANEL
A/G RATIO: 1.7 (ref 1.2–2.2)
ALBUMIN: 4.3 g/dL (ref 3.5–5.5)
ALK PHOS: 72 IU/L (ref 39–117)
ALT: 31 IU/L (ref 0–44)
AST: 25 IU/L (ref 0–40)
BUN/Creatinine Ratio: 14 (ref 9–20)
BUN: 12 mg/dL (ref 6–24)
Bilirubin Total: 0.2 mg/dL (ref 0.0–1.2)
CALCIUM: 9.1 mg/dL (ref 8.7–10.2)
CHLORIDE: 105 mmol/L (ref 96–106)
CO2: 24 mmol/L (ref 18–29)
Creatinine, Ser: 0.88 mg/dL (ref 0.76–1.27)
GFR calc non Af Amer: 97 mL/min/{1.73_m2} (ref >59)
GFR, EST AFRICAN AMERICAN: 113 mL/min/{1.73_m2} (ref >59)
GLOBULIN, TOTAL: 2.6 g/dL (ref 1.5–4.5)
GLUCOSE: 84 mg/dL (ref 65–99)
POTASSIUM: 4.8 mmol/L (ref 3.5–5.2)
SODIUM: 144 mmol/L (ref 134–144)
TOTAL PROTEIN: 6.9 g/dL (ref 6.0–8.5)

## 2016-12-11 LAB — HEMOGLOBIN A1C
ESTIMATED AVERAGE GLUCOSE: 120 mg/dL
Hgb A1c MFr Bld: 5.8 % — ABNORMAL HIGH (ref 4.8–5.6)

## 2016-12-11 LAB — LIPID PANEL
Chol/HDL Ratio: 2.8 ratio (ref 0.0–5.0)
Cholesterol, Total: 121 mg/dL (ref 100–199)
HDL: 43 mg/dL (ref >39)
LDL Calculated: 61 mg/dL (ref 0–99)
Triglycerides: 83 mg/dL (ref 0–149)
VLDL CHOLESTEROL CAL: 17 mg/dL (ref 5–40)

## 2016-12-11 LAB — TSH: TSH: 2.4 u[IU]/mL (ref 0.450–4.500)

## 2016-12-17 ENCOUNTER — Ambulatory Visit: Payer: Self-pay | Admitting: Family Medicine

## 2016-12-17 VITALS — BP 148/89 | HR 64 | Temp 98.5°F | Wt 210.2 lb

## 2016-12-17 DIAGNOSIS — E119 Type 2 diabetes mellitus without complications: Secondary | ICD-10-CM

## 2016-12-17 DIAGNOSIS — R079 Chest pain, unspecified: Secondary | ICD-10-CM

## 2016-12-17 DIAGNOSIS — I10 Essential (primary) hypertension: Secondary | ICD-10-CM

## 2016-12-17 DIAGNOSIS — E782 Mixed hyperlipidemia: Secondary | ICD-10-CM

## 2016-12-17 LAB — GLUCOSE, POCT (MANUAL RESULT ENTRY): POC GLUCOSE: 117 mg/dL — AB (ref 70–99)

## 2016-12-17 MED ORDER — AMLODIPINE BESYLATE 5 MG PO TABS: 5.0000 mg | ORAL_TABLET | Freq: Every day | ORAL | 5 refills | Status: DC

## 2016-12-17 NOTE — Progress Notes (Signed)
BP (!) 148/89   Pulse 64   Temp 98.5 F (36.9 C)   Wt 210 lb 3.2 oz (95.3 kg)   BMI 30.16 kg/m    Subjective:    Patient ID: Roy Norton, male    DOB: [birth year], 56 y.o.   MRN: [id number]  HPI: Roy Norton is a 56 y.o. male  Chief Complaint  Patient presents with  . Follow-up    HPI Type 2 diabetes Sugars are "running good", 117-120 range; some dry mouth; no blurred vision No problems with feet Having headaches, not sure if glasses; seeing eye doctor; drinking water; temples He knows to not go barefoot  Brown spot on the left temple; just wants it checked  High cholesterol; avoid fats; on atorvastatin 40 mg; last LDl 61  Hx of stroke; taking baby aspirin; was at work one day, hit him all at once  Does have chest pains off and on, going on for a year or so; has seen a heart doctor; they couldn't find nothing, ran all kinds of tests and they couldn't find nothing wrong; no blockages; knows to not overdo it; carries NTG; he does not want to have another stress test; I offered to refer to cardiologist and he declined  No flowsheet data found.  Relevant past medical, surgical, family and social history reviewed Past Medical History:  Diagnosis Date  . Diabetes mellitus without complication (HCC)   . GERD (gastroesophageal reflux disease)   . Hypertension   . Stroke [facility])    No past surgical history on file.   Family History  Problem Relation Age of Onset  . Hypertension Mother   . Hypertension Father   . Diabetes Father    Social History  Substance Use Topics  . Smoking status: Former Smoker    Years: 10.00    Types: Cigarettes    Quit date: 12/18/2015  . Smokeless tobacco: Not on file  . Alcohol use No    Interim medical history since last visit reviewed. Allergies and medications reviewed  Review of Systems Per HPI unless specifically indicated above     Objective:    BP (!) 148/89   Pulse 64   Temp 98.5 F (36.9 C)   Wt 210 lb 3.2  oz (95.3 kg)   BMI 30.16 kg/m   Wt Readings from Last 3 Encounters:  12/17/16 210 lb 3.2 oz (95.3 kg)  09/17/16 223 lb 9.6 oz (101.4 kg)  04/16/16 213 lb 8 oz (96.8 kg)    Physical Exam  Constitutional: He appears well-developed and well-nourished. No distress.  HENT:  Head: Normocephalic and atraumatic.  Eyes: EOM are normal. No scleral icterus.  Neck: No thyromegaly present.  Cardiovascular: Normal rate and regular rhythm.   Pulmonary/Chest: Effort normal and breath sounds normal.  Abdominal: He exhibits no distension.  Musculoskeletal: He exhibits no edema.  Skin: Skin is warm and dry. No pallor.  Psychiatric: He has a normal mood and affect. His behavior is normal. Judgment and thought content normal.   Diabetic Foot Form - Detailed   Diabetic Foot Exam - detailed Diabetic Foot exam was performed with the following findings:  Yes 12/17/2016  6:43 PM  Visual Foot Exam completed.:  Yes  Are the toenails ingrown?:  No Normal Range of Motion:  Yes Pulse Foot Exam completed.:  Yes  Right Dorsalis Pedis:  Present Left Dorsalis Pedis:  Present  Sensory Foot Exam Completed.:  Yes Swelling:  No Semmes-Weinstein Monofilament Test  Results for orders placed or performed in visit on 12/17/16  POCT Glucose (CBG)  Result Value Ref Range   POC Glucose 117 (A) 70 - 99 mg/dl      Assessment & Plan:   Problem List Items Addressed This Visit      Cardiovascular and Mediastinum   Essential hypertension    Uncontrolled; continue beta-blocker and ACE-I; add CCB; return in 3 weeks for recheck      Relevant Medications   amLODipine (NORVASC) 5 MG tablet     Endocrine   Diabetes mellitus (HCC)    Foot exam by MD today; nail trimming recommended; glad he is not going barefoot; no neuropathy; last A1c excellent, so STOP the sulfonylurea which may be contributing to headaches if having hypoglycemia        Other   Hyperlipidemia    Excellent lipid panel, reviewed; continue  statin      Relevant Medications   amLODipine (NORVASC) 5 MG tablet   Chest pain at rest    Ongoing problem since listed September 2016; patient declined offer for stress testing, cardiologist evaluation; continue beta-blocker and aspirin; NTG with him; consider coronary vasospasm; start amlodipine; return for f/u in 3 weeks       Other Visit Diagnoses    Diabetes mellitus without complication (HCC)    -  Primary   Relevant Orders   POCT Glucose (CBG) (Completed)       Follow up plan: Return in about 3 weeks (around 01/07/2017) for blood pressure and follow-up, no fasting labs.  An after-visit summary was printed and given to the patient at check-out.  Please see the patient instructions which may contain other information and recommendations beyond what is mentioned above in the assessment and plan.  Meds ordered this encounter  Medications  . amLODipine (NORVASC) 5 MG tablet    Sig: Take 1 tablet (5 mg total) by mouth daily.    Dispense:  30 tablet    Refill:  5    Orders Placed This Encounter  Procedures  . POCT Glucose (CBG)

## 2016-12-17 NOTE — Assessment & Plan Note (Signed)
Uncontrolled; continue beta-blocker and ACE-I; add CCB; return in 3 weeks for recheck

## 2016-12-17 NOTE — Patient Instructions (Addendum)
You can stop taking your glipizide Continue the metformin for your diabetes Start the new medicine called amlodipine for blood pressure and possible spasm

## 2016-12-17 NOTE — Assessment & Plan Note (Signed)
Excellent lipid panel, reviewed; continue statin

## 2016-12-17 NOTE — Assessment & Plan Note (Signed)
Foot exam by MD today; nail trimming recommended; glad he is not going barefoot; no neuropathy; last A1c excellent, so STOP the sulfonylurea which may be contributing to headaches if having hypoglycemia

## 2016-12-17 NOTE — Assessment & Plan Note (Addendum)
Ongoing problem since listed September 2016; patient declined offer for stress testing, cardiologist evaluation; continue beta-blocker and aspirin; NTG with him; consider coronary vasospasm; start amlodipine; return for f/u in 3 weeks

## 2017-01-07 ENCOUNTER — Other Ambulatory Visit: Payer: Self-pay

## 2017-01-07 ENCOUNTER — Ambulatory Visit: Payer: Self-pay | Admitting: Urology

## 2017-01-07 VITALS — BP 126/80 | HR 92 | Temp 98.3°F | Wt 210.0 lb

## 2017-01-07 DIAGNOSIS — E119 Type 2 diabetes mellitus without complications: Secondary | ICD-10-CM

## 2017-01-07 DIAGNOSIS — G43009 Migraine without aura, not intractable, without status migrainosus: Secondary | ICD-10-CM

## 2017-01-07 DIAGNOSIS — R079 Chest pain, unspecified: Secondary | ICD-10-CM

## 2017-01-07 LAB — GLUCOSE, POCT (MANUAL RESULT ENTRY): POC Glucose: 112 mg/dl — AB (ref 70–99)

## 2017-01-07 MED ORDER — AMITRIPTYLINE HCL 50 MG PO TABS: ORAL_TABLET | ORAL | 3 refills | Status: DC

## 2017-01-07 NOTE — Progress Notes (Signed)
BP 126/80   Pulse 92   Temp 98.3 F (36.8 C)   Wt 210 lb (95.3 kg)   BMI 30.13 kg/m    Subjective:    Patient ID: Roy Norton, male    DOB: 15-Mar-1961, 56 y.o.   MRN: [id number]  HPI: Roy Norton is a 56 y.o. male  No chief complaint on file.   HPI Type 2 diabetes Sugars are "running good", 117-120 range; some dry mouth; no blurred vision No problems with feet Having headaches, having to take 100 mg amitriptyline for relief, pain is across the forehead in the temples and over the eyes, pain is pulsatile, eye doctor states his eyes are fine   Brown spot on the left temple; just wants it checked  High cholesterol; avoid fats; on atorvastatin 40 mg; last LDl 61  Hx of stroke; taking baby aspirin; was at work one day, hit him all at once  Does have chest pains off and on, going on for a year or so; has seen a heart doctor; they couldn't find nothing, ran all kinds of tests and they couldn't find nothing wrong; no blockages; knows to not overdo it; carries NTG; he does not want to have another stress test; I offered to refer to cardiologist and he declined  Started on amlodipine at 12/17/2016 but he states it has not helped with the chest pain  SOB with exertion, but no chest pain  Walks 2 miles a day and does not get SOB or CP  No flowsheet data found.  Relevant past medical, surgical, family and social history reviewed Past Medical History:  Diagnosis Date  . Diabetes mellitus without complication (HCC)   . GERD (gastroesophageal reflux disease)   . Hypertension   . Stroke [facility])    No past surgical history on file.   Family History  Problem Relation Age of Onset  . Hypertension Mother   . Hypertension Father   . Diabetes Father    Social History  Substance Use Topics  . Smoking status: Former Smoker    Years: 10.00    Types: Cigarettes    Quit date: 12/18/2015  . Smokeless tobacco: Not on file  . Alcohol use No    Interim medical history since  last visit reviewed. Allergies and medications reviewed  Review of Systems Per HPI unless specifically indicated above     Objective:    BP 126/80   Pulse 92   Temp 98.3 F (36.8 C)   Wt 210 lb (95.3 kg)   BMI 30.13 kg/m   Wt Readings from Last 3 Encounters:  01/07/17 210 lb (95.3 kg)  12/17/16 210 lb 3.2 oz (95.3 kg)  09/17/16 223 lb 9.6 oz (101.4 kg)    Physical Exam  Constitutional: He appears well-developed and well-nourished. No distress.  HENT:  Head: Normocephalic and atraumatic.  Eyes: EOM are normal. No scleral icterus.  Neck: No thyromegaly present.  Cardiovascular: Normal rate and regular rhythm.   Pulmonary/Chest: Effort normal and breath sounds normal.  Abdominal: He exhibits no distension.  Musculoskeletal: He exhibits no edema.  Skin: Skin is warm and dry. No pallor.  Psychiatric: He has a normal mood and affect. His behavior is normal. Judgment and thought content normal.   Diabetic Foot Form - Detailed   No data filed       Results for orders placed or performed in visit on 01/07/17  POCT Glucose (CBG)  Result Value Ref Range   POC Glucose 112 (  A) 70 - 99 mg/dl      Assessment & Plan:   1. Chest pain at rest  - still resistant to seeing a cardiologist and/or undergoing test  - continue amlodipine and recheck patient in one month  2. Migraines  - increase amitriptyline to 100 mg qhs  - Recheck in one month    3. DM  - good control  Follow up plan: No Follow-up on file.  An after-visit summary was printed and given to the patient at check-out.  Please see the patient instructions which may contain other information and recommendations beyond what is mentioned above in the assessment and plan.

## 2017-01-27 ENCOUNTER — Telehealth: Payer: Self-pay | Admitting: Pharmacist

## 2017-01-27 NOTE — Telephone Encounter (Signed)
01/27/17 Place refill online with [facility] for Ventolin HFA, to ship 02/09/17, order#[id number].

## 2017-02-11 ENCOUNTER — Ambulatory Visit: Payer: Self-pay | Admitting: Urology

## 2017-02-11 VITALS — BP 124/84 | HR 75 | Temp 98.5°F | Wt 217.8 lb

## 2017-02-11 DIAGNOSIS — R079 Chest pain, unspecified: Secondary | ICD-10-CM

## 2017-02-11 DIAGNOSIS — E119 Type 2 diabetes mellitus without complications: Secondary | ICD-10-CM

## 2017-02-11 LAB — GLUCOSE, POCT (MANUAL RESULT ENTRY): POC Glucose: 116 mg/dl — AB (ref 70–99)

## 2017-02-11 NOTE — Progress Notes (Signed)
Patient: Roy Norton Male    DOB: 01-31-1961   56 y.o.   MRN: [id number] Visit Date: 02/11/2017  Today's Provider: Michiel Cowboy, PA-C   Chief Complaint  Patient presents with  . Chest Pain   Subjective:    HPI 56 yo WM who continues to have chest pains.    He has not found the addition of the amlodipine helpful.  He states that these pains are located to the left of the sternum.  The pains are occurring at rest and exercise.  He had a bad episode on Monday that lasted one hour.  He has nitroglycerin on hand, but he did not take them.  He did take an ASA, but that did not help.  He was also cold and clammy.  He had a bad headache afterwards.    These episodes have been occurring since 2016, but they are becoming more intense and frequent.     Allergies  Allergen Reactions  . Citalopram Nausea Only  . Omeprazole Nausea Only   Previous Medications   ACETAMINOPHEN (TYLENOL) 500 MG TABLET    Take 500 mg by mouth every 6 (six) hours as needed.   ALBUTEROL (PROVENTIL) (2.5 MG/3ML) 0.083% NEBULIZER SOLUTION    Take 2.5 mg by nebulization every 6 (six) hours as needed for wheezing or shortness of breath.   AMITRIPTYLINE (ELAVIL) 50 MG TABLET    Take two tablets at night   AMLODIPINE (NORVASC) 5 MG TABLET    Take 1 tablet (5 mg total) by mouth daily.   ASPIRIN 81 MG CHEWABLE TABLET    Chew 1 tablet (81 mg total) by mouth daily.   ATORVASTATIN (LIPITOR) 40 MG TABLET    Take 1 tablet (40 mg total) by mouth daily.   GABAPENTIN (NEURONTIN) 300 MG CAPSULE    Take 1 capsule (300 mg total) by mouth 3 (three) times daily.   GLIPIZIDE (GLUCOTROL) 10 MG TABLET    Take 10 mg by mouth daily before breakfast.   LISINOPRIL (PRINIVIL,ZESTRIL) 20 MG TABLET    Take 1 tablet (20 mg total) by mouth daily.   METFORMIN (GLUCOPHAGE) 500 MG TABLET    TAKE ONE TABLET BY MOUTH 2 TIMES A DAY   METOPROLOL TARTRATE (LOPRESSOR) 25 MG TABLET    Take 1 tablet (25 mg total) by mouth 2 (two) times daily.   OMEPRAZOLE (PRILOSEC) 20 MG CAPSULE    Take 1 capsule (20 mg total) by mouth daily.    Review of Systems  Social History  Substance Use Topics  . Smoking status: Former Smoker    Years: 10.00    Types: Cigarettes    Quit date: 12/18/2015  . Smokeless tobacco: Not on file  . Alcohol use No   Objective:   BP 124/84   Pulse 75   Temp 98.5 F (36.9 C)   Wt 217 lb 12.8 oz (98.8 kg)   BMI 31.25 kg/m   Physical Exam Constitutional: Well nourished. Alert and oriented, No acute distress. HEENT: [place] AT, moist mucus membranes. Trachea midline, no masses. Cardiovascular: No clubbing, cyanosis, or edema. Respiratory: Normal respiratory effort, no increased work of breathing. GI: Abdomen is soft, non tender, non distended, no abdominal masses.  GU: No CVA tenderness.  No bladder fullness or masses.   Skin: No rashes, bruises or suspicious lesions. Lymph: No cervical or inguinal adenopathy. Neurologic: Grossly intact, no focal deficits, moving all 4 extremities. Psychiatric: Normal mood and affect.      Assessment & Plan:  1. Chest pains  - patient is now willing to be referred to cardiologist  - STRONGLY ENCOURAGED TO SEEK TREATMENT IN THE ED WHEN CHEST PAIN OCCURS        Michiel CowboySHANNON Sufyan Meidinger, PA-C   [facility]

## 2017-02-12 ENCOUNTER — Telehealth: Payer: Self-pay

## 2017-02-12 NOTE — Telephone Encounter (Signed)
Referral [facility] to [facility].

## 2017-02-23 ENCOUNTER — Ambulatory Visit (INDEPENDENT_AMBULATORY_CARE_PROVIDER_SITE_OTHER): Payer: Self-pay | Admitting: Cardiovascular Disease

## 2017-02-23 ENCOUNTER — Encounter: Payer: Self-pay | Admitting: Cardiovascular Disease

## 2017-02-23 VITALS — BP 116/82 | HR 83 | Ht 70.0 in | Wt 217.5 lb

## 2017-02-23 DIAGNOSIS — E782 Mixed hyperlipidemia: Secondary | ICD-10-CM

## 2017-02-23 DIAGNOSIS — I10 Essential (primary) hypertension: Secondary | ICD-10-CM

## 2017-02-23 DIAGNOSIS — R079 Chest pain, unspecified: Secondary | ICD-10-CM

## 2017-02-23 NOTE — Patient Instructions (Addendum)
Medication Instructions:  Your physician recommends that you continue on your current medications as directed. Please refer to the Current Medication list given to you today.   Labwork: none  Testing/Procedures: Your physician has requested that you have an exercise tolerance test. For further information please visit [email or web address]. Please also follow instruction sheet, as given.  Please do not take metoprolol the morning of your treadmill test. Please bring your inhaler with you and wear closed-toe walking shoes (such as sneakers) as you will be walking on a treadmill.     Follow-Up: Your physician recommends that you schedule a follow-up appointment as needed with Dr. Kirke CorinArida.    Any Other Special Instructions Will Be Listed Below (If Applicable).     If you need a refill on your cardiac medications before your next appointment, please call your pharmacy.   Exercise Stress Electrocardiogram An exercise stress electrocardiogram is a test to check how blood flows to your heart. It is done to find areas of poor blood flow. You will need to walk on a treadmill for this test. The electrocardiogram will record your heartbeat when you are at rest and when you are exercising. What happens before the procedure?  Do not have drinks with caffeine or foods with caffeine for 24 hours before the test, or as told by your doctor. This includes coffee, tea (even decaf tea), sodas, chocolate, and cocoa.  Follow your doctor's instructions about eating and drinking before the test.  Ask your doctor what medicines you should or should not take before the test. Take your medicines with water unless told by your doctor not to.  If you use an inhaler, bring it with you to the test.  Bring a snack to eat after the test.  Do not  smoke for 4 hours before the test.  Do not put lotions, powders, creams, or oils on your chest before the test.  Wear comfortable shoes and clothing. What happens  during the procedure?  You will have patches put on your chest. Small areas of your chest may need to be shaved. Wires will be connected to the patches.  Your heart rate will be watched while you are resting and while you are exercising.  You will walk on the treadmill. The treadmill will slowly get faster to raise your heart rate.  The test will take about 1-2 hours. What happens after the procedure?  Your heart rate and blood pressure will be watched after the test.  You may return to your normal diet, activities, and medicines or as told by your doctor. This information is not intended to replace advice given to you by your health care provider. Make sure you discuss any questions you have with your health care provider. Document Released: 01/27/2008 Document Revised: 04/08/2016 Document Reviewed: 04/17/2013 Elsevier Interactive Patient Education  [employer]2018 Elsevier Inc.

## 2017-02-23 NOTE — Progress Notes (Signed)
Cardiology Office Note   Date:  02/23/2017   ID:  Roy Norton, DOB 07-Sep-1960, MRN [id number]  PCP:  Zachery Dauerdem, Donna S, FNP  Cardiologist:   Lorine BearsMuhammad Meda Dudzinski, MD   Chief Complaint  Patient presents with  . other    Ref by Dr. Marlana Latusdem for chest pain.  Meds reviewed by the pt. verbally. Pt. c/o chest pain and shortness of breath.       History of Present Illness: Roy Norton is a 56 y.o. male who Was [facility] by C[facility]onna [facility]. The patient has multiple chronic medical conditions that include type 2 diabetes, hypertension, obesity, hyperlipidemia and tobacco use. He quit smoking 2 months ago. [facility] with chest pain described as pleuritic discomfort. Troponin and d-dimer were negative. He underwent a nuclear stress test which showed no evidence of ischemia with normal ejection fraction. Echocardiogram showed normal LV systolic function with mild mitral regurgitation. He had intermittent chest pain since then but this has worsened over the last few months. He describes substernal and left sided discomfort described as sharp and occasionally heavy pain with no radiation. This happens at rest or with physical activities and usually last for a few minutes. It is associated with shortness of breath. He has no family history of coronary artery disease.   Past Medical History:  Diagnosis Date  . Diabetes mellitus without complication (HCC)   . GERD (gastroesophageal reflux disease)   . Hyperlipidemia   . Hypertension   . Stroke [facility](HCC)     Past Surgical History:  Procedure Laterality Date  . CARDIAC CATHETERIZATION  2013     Current Outpatient Prescriptions  Medication Sig Dispense Refill  . acetaminophen (TYLENOL) 500 MG tablet Take 500 mg by mouth every 6 (six) hours as needed.    Marland Kitchen. albuterol (PROVENTIL) (2.5 MG/3ML) 0.083% nebulizer solution Take 2.5 mg by nebulization every 6 (six) hours as needed  for wheezing or shortness of breath.    Marland Kitchen. amLODipine (NORVASC) 5 MG tablet Take 1 tablet (5 mg total) by mouth daily. 30 tablet 5  . aspirin 81 MG chewable tablet Chew 1 tablet (81 mg total) by mouth daily. 90 tablet 1  . atorvastatin (LIPITOR) 40 MG tablet Take 1 tablet (40 mg total) by mouth daily. 90 tablet 1  . glipiZIDE (GLUCOTROL) 10 MG tablet Take 10 mg by mouth daily before breakfast.    . lisinopril (PRINIVIL,ZESTRIL) 20 MG tablet Take 1 tablet (20 mg total) by mouth daily. 90 tablet 3  . metFORMIN (GLUCOPHAGE) 500 MG tablet TAKE ONE TABLET BY MOUTH 2 TIMES A DAY 180 tablet 0  . metoprolol tartrate (LOPRESSOR) 25 MG tablet Take 1 tablet (25 mg total) by mouth 2 (two) times daily. 180 tablet 3  . omeprazole (PRILOSEC) 20 MG capsule Take 1 capsule (20 mg total) by mouth daily. 90 capsule 1   No current facility-administered medications for this visit.     Allergies:   Citalopram and Omeprazole    Social History:  The patient  reports that he quit smoking about 14 months ago. His smoking use included Cigarettes. He quit after 10.00 years of use. He has never used smokeless tobacco. He reports that he does not drink alcohol or use drugs.   Family History:  The patient's family history includes Diabetes in his father; Hypertension in his father and mother.    ROS:  Please see the history  of present illness.   Otherwise, review of systems are positive for none.   All other systems are reviewed and negative.    PHYSICAL EXAM: VS:  BP 116/82 (BP Location: Right Arm, Patient Position: Sitting, Cuff Size: Normal)   Pulse 83   Ht 5\' 10"  (1.778 m)   Wt 217 lb 8 oz (98.7 kg)   BMI 31.21 kg/m  , BMI Body mass index is 31.21 kg/m. GEN: Well nourished, well developed, in no acute distress  HEENT: normal  Neck: no JVD, carotid bruits, or masses Cardiac: RRR; no murmurs, rubs, or gallops,no edema  Respiratory:  clear to auscultation bilaterally, normal work of breathing GI: soft,  nontender, nondistended, + BS MS: no deformity or atrophy  Skin: warm and dry, no rash Neuro:  Strength and sensation are intact Psych: euthymic mood, full affect   EKG:  EKG is ordered today. The ekg ordered today demonstrates  normal sinus rhythm with no significant ST or T wave changes.   Recent Labs: 04/16/2016: Hemoglobin 15.1; Platelets 193 12/10/2016: ALT 31; BUN 12; Creatinine, Ser 0.88; Potassium 4.8; Sodium 144; TSH 2.400    Lipid Panel    Component Value Date/Time   CHOL 121 12/10/2016 1818   CHOL 161 04/16/2014 0337   TRIG 83 12/10/2016 1818   TRIG 410 (H) 04/16/2014 0337   HDL 43 12/10/2016 1818   HDL 24 (L) 04/16/2014 0337   CHOLHDL 2.8 12/10/2016 1818   CHOLHDL 4.8 04/30/2015 0250   VLDL 35 04/30/2015 0250   VLDL SEE COMMENT 04/16/2014 0337   LDLCALC 61 12/10/2016 1818   LDLCALC SEE COMMENT 04/16/2014 0337      Wt Readings from Last 3 Encounters:  02/23/17 217 lb 8 oz (98.7 kg)  02/11/17 217 lb 12.8 oz (98.8 kg)  01/07/17 210 lb (95.3 kg)       PAD Screen 02/23/2017  Previous PAD dx? Yes  Previous surgical procedure? No  Pain with walking? Yes  Subsides with rest? Yes  Feet/toe relief with dangling? No  Painful, non-healing ulcers? No  Extremities discolored? No      ASSESSMENT AND PLAN:  1.  Atypical chest pain. His chest pain is overall atypical and most of the time is nonexertional. Nonetheless, he has multiple risk factors for coronary artery disease. He had previous cardiac workup in 2016 which was unremarkable. His baseline ECG is normal. I requested a treadmill stress test for evaluation. If stress test comes back unremarkable, I recommend continued treatment of risk factors.  2. Essential hypertension: Blood pressure is controlled on current medications.  3. Hyperlipidemia: Continue treatment with atorvastatin with a recommended LDL target less than 70 given that he is diabetic.    Disposition:   FU with me as  needed.  Signed,  Lorine Bears, MD  02/23/2017 3:24 PM     [facility]

## 2017-03-02 ENCOUNTER — Emergency Department: Payer: Self-pay

## 2017-03-02 ENCOUNTER — Emergency Department
Admission: EM | Admit: 2017-03-02 | Discharge: 2017-03-02 | Disposition: A | Discharge status: Home / Self Care | Payer: Self-pay | Attending: Emergency Medicine | Admitting: Emergency Medicine

## 2017-03-02 ENCOUNTER — Encounter: Payer: Self-pay | Admitting: Emergency Medicine

## 2017-03-02 DIAGNOSIS — E119 Type 2 diabetes mellitus without complications: Secondary | ICD-10-CM | POA: Insufficient documentation

## 2017-03-02 DIAGNOSIS — Z79899 Other long term (current) drug therapy: Secondary | ICD-10-CM | POA: Insufficient documentation

## 2017-03-02 DIAGNOSIS — Z8673 Personal history of transient ischemic attack (TIA), and cerebral infarction without residual deficits: Secondary | ICD-10-CM | POA: Insufficient documentation

## 2017-03-02 DIAGNOSIS — Z87891 Personal history of nicotine dependence: Secondary | ICD-10-CM | POA: Insufficient documentation

## 2017-03-02 DIAGNOSIS — Z7984 Long term (current) use of oral hypoglycemic drugs: Secondary | ICD-10-CM | POA: Insufficient documentation

## 2017-03-02 DIAGNOSIS — Z7982 Long term (current) use of aspirin: Secondary | ICD-10-CM | POA: Insufficient documentation

## 2017-03-02 DIAGNOSIS — R079 Chest pain, unspecified: Secondary | ICD-10-CM | POA: Insufficient documentation

## 2017-03-02 DIAGNOSIS — I10 Essential (primary) hypertension: Secondary | ICD-10-CM | POA: Insufficient documentation

## 2017-03-02 LAB — BASIC METABOLIC PANEL
ANION GAP: 8 (ref 5–15)
BUN: 17 mg/dL (ref 6–20)
CO2: 24 mmol/L (ref 22–32)
Calcium: 9.8 mg/dL (ref 8.9–10.3)
Chloride: 108 mmol/L (ref 101–111)
Creatinine, Ser: 0.77 mg/dL (ref 0.61–1.24)
GFR calc Af Amer: >60 mL/min (ref >60)
GLUCOSE: 115 mg/dL — AB (ref 65–99)
POTASSIUM: 3.9 mmol/L (ref 3.5–5.1)
Sodium: 140 mmol/L (ref 135–145)

## 2017-03-02 LAB — CBC
HEMATOCRIT: 42.7 % (ref 40.0–52.0)
HEMOGLOBIN: 14.7 g/dL (ref 13.0–18.0)
MCH: 30.8 pg (ref 26.0–34.0)
MCHC: 34.5 g/dL (ref 32.0–36.0)
MCV: 89.2 fL (ref 80.0–100.0)
Platelets: 194 10*3/uL (ref 150–440)
RBC: 4.79 MIL/uL (ref 4.40–5.90)
RDW: 14 % (ref 11.5–14.5)
WBC: 6.8 10*3/uL (ref 3.8–10.6)

## 2017-03-02 LAB — TROPONIN I: Troponin I: <0.03 ng/mL (ref <0.03)

## 2017-03-02 MED ORDER — ASPIRIN 81 MG PO CHEW
162.0000 mg | CHEWABLE_TABLET | Freq: Once | ORAL | Status: AC
  Administered 2017-03-02: 162 mg via ORAL
  Filled 2017-03-02: qty 2

## 2017-03-02 NOTE — Discharge Instructions (Signed)
Please keep your appointment for your stress test in 2 days as scheduled and keep your appointment to follow-up with your cardiologist Dr. Kirke CorinArida as scheduled.  Return to the emergency department sooner for any concerns.  It was a pleasure to take care of you today, and thank you for coming to our emergency department.  If you have any questions or concerns before leaving please ask the nurse to grab me and I'm more than happy to go through your aftercare instructions again.  If you were prescribed any opioid pain medication today such as Norco, Vicodin, Percocet, morphine, hydrocodone, or oxycodone please make sure you do not drive when you are taking this medication as it can alter your ability to drive safely.  If you have any concerns once you are home that you are not improving or are in fact getting worse before you can make it to your follow-up appointment, please do not hesitate to call 911 and come back for further evaluation.  Merrily BrittleNeil Sonu Kruckenberg MD  Results for orders placed or performed during the hospital encounter of 03/02/17  Basic metabolic panel  Result Value Ref Range   Sodium 140 135 - 145 mmol/L   Potassium 3.9 3.5 - 5.1 mmol/L   Chloride 108 101 - 111 mmol/L   CO2 24 22 - 32 mmol/L   Glucose, Bld 115 (H) 65 - 99 mg/dL   BUN 17 6 - 20 mg/dL   Creatinine, Ser 0.980.77 0.61 - 1.24 mg/dL   Calcium 9.8 8.9 - 11.910.3 mg/dL   GFR calc non Af Amer >60 >60 mL/min   GFR calc Af Amer >60 >60 mL/min   Anion gap 8 5 - 15  CBC  Result Value Ref Range   WBC 6.8 3.8 - 10.6 K/uL   RBC 4.79 4.40 - 5.90 MIL/uL   Hemoglobin 14.7 13.0 - 18.0 g/dL   HCT 14.742.7 82.940.0 - 56.252.0 %   MCV 89.2 80.0 - 100.0 fL   MCH 30.8 26.0 - 34.0 pg   MCHC 34.5 32.0 - 36.0 g/dL   RDW 13.014.0 86.511.5 - 78.414.5 %   Platelets 194 150 - 440 K/uL  Troponin I  Result Value Ref Range   Troponin I <0.03 <0.03 ng/mL  Troponin I  Result Value Ref Range   Troponin I <0.03 <0.03 ng/mL   Dg Chest 2 View  Result Date:  03/02/2017 CLINICAL DATA:  Left chest wall pain beginning at 11:30 a.m. today. EXAM: CHEST  2 VIEW COMPARISON:  None. FINDINGS: The lungs are clear. Heart size is normal. No pneumothorax or pleural effusion. Aortic atherosclerosis noted. No acute bony abnormality. IMPRESSION: No acute disease. Atherosclerosis. Electronically Signed   By: Drusilla Kannerhomas  Dalessio M.D.   On: 03/02/2017 15:24

## 2017-03-02 NOTE — ED Provider Notes (Signed)
[facility] Emergency Department Provider Note  ____________________________________________   First MD Initiated Contact with Patient 03/02/17 1717     (approximate)  I have reviewed the triage vital signs and the nursing notes.   HISTORY  Chief Complaint Numbness and Chest Pain    HPI Roy Norton is a 56 y.o. male who comes to the emergency department via EMS after a brief episode of substernal chest pain. Was not exertional. It began when he was sitting down. Not associated with shortness of breath. He's never been diagnosed with coronary artery disease but he does have a stress test scheduled in 2 days. He's never had a DVT or pulmonary embolism. No leg swelling. Nothing seems to make the pain better or worse. He is currently in no pain whatsoever.   Past Medical History:  Diagnosis Date  . Diabetes mellitus without complication (HCC)   . GERD (gastroesophageal reflux disease)   . Hyperlipidemia   . Hypertension   . Stroke [facility])     Patient Active Problem List   Diagnosis Date Noted  . Migraine 12/12/2015  . History of tobacco abuse 04/30/2015  . Hyperlipidemia 04/30/2015  . Essential hypertension 04/30/2015  . Diabetes mellitus (HCC) 04/30/2015  . Obesity 04/30/2015  . Chest pain at rest 04/28/2015  . Dizziness 04/11/2015    Past Surgical History:  Procedure Laterality Date  . CARDIAC CATHETERIZATION  2013    Prior to Admission medications   Medication Sig Start Date End Date Taking? Authorizing Provider  acetaminophen (TYLENOL) 500 MG tablet Take 500 mg by mouth every 6 (six) hours as needed.    [provider]  albuterol (PROVENTIL) (2.5 MG/3ML) 0.083% nebulizer solution Take 2.5 mg by nebulization every 6 (six) hours as needed for wheezing or shortness of breath.    [provider]  amLODipine (NORVASC) 5 MG tablet Take 1 tablet (5 mg total) by mouth daily. 12/17/16   Kerman Passey, MD  aspirin 81 MG  chewable tablet Chew 1 tablet (81 mg total) by mouth daily. 09/17/16   Zachery Dauer, FNP  atorvastatin (LIPITOR) 40 MG tablet Take 1 tablet (40 mg total) by mouth daily. 09/17/16   Zachery Dauer, FNP  glipiZIDE (GLUCOTROL) 10 MG tablet Take 10 mg by mouth daily before breakfast.    [provider]  lisinopril (PRINIVIL,ZESTRIL) 20 MG tablet Take 1 tablet (20 mg total) by mouth daily. 09/17/16   Zachery Dauer, FNP  metFORMIN (GLUCOPHAGE) 500 MG tablet TAKE ONE TABLET BY MOUTH 2 TIMES A DAY 09/17/16   Odem, Deeann Cree, FNP  metoprolol tartrate (LOPRESSOR) 25 MG tablet Take 1 tablet (25 mg total) by mouth 2 (two) times daily. 09/17/16   Zachery Dauer, FNP  omeprazole (PRILOSEC) 20 MG capsule Take 1 capsule (20 mg total) by mouth daily. 09/17/16   Zachery Dauer, FNP    Allergies Citalopram and Omeprazole  Family History  Problem Relation Age of Onset  . Hypertension Mother   . Hypertension Father   . Diabetes Father     Social History Social History  Substance Use Topics  . Smoking status: Former Smoker    Years: 10.00    Types: Cigarettes    Quit date: 12/18/2015  . Smokeless tobacco: Never Used  . Alcohol use No    Review of Systems Constitutional: No fever/chills Eyes: No visual changes. ENT: No sore throat. Cardiovascular: Positive chest pain. Respiratory: Denies shortness of breath. Gastrointestinal: No abdominal pain.  No nausea, no vomiting.  No diarrhea.  No constipation. Genitourinary: Negative for dysuria. Musculoskeletal: Negative for back pain. Skin: Negative for rash. Neurological: Negative for headaches, focal weakness or numbness.   ____________________________________________   PHYSICAL EXAM:  VITAL SIGNS: ED Triage Vitals  Enc Vitals Group     BP 03/02/17 1519 118/85     Pulse Rate 03/02/17 1519 75     Resp 03/02/17 1519 16     Temp 03/02/17 1519 97.8 F (36.6 C)     Temp Source 03/02/17 1519 Oral     SpO2 03/02/17 1519 96 %     Weight 03/02/17  1429 217 lb (98.4 kg)     Height 03/02/17 1429 5\' 10"  (1.778 m)     Head Circumference --      Peak Flow --      Pain Score --      Pain Loc --      Pain Edu? --      Excl. in GC? --     Constitutional: Alert and oriented 4 well appearing Eyes: PERRL EOMI. Head: Atraumatic. Nose: No congestion/rhinnorhea. Mouth/Throat: No trismus Neck: No stridor.   Cardiovascular: Normal rate, regular rhythm. Grossly normal heart sounds.  Good peripheral circulation. Respiratory: Normal respiratory effort.  No retractions. Lungs CTAB and moving good air Gastrointestinal: Soft nontender Musculoskeletal: No lower extremity edema   Neurologic:  Normal speech and language. No gross focal neurologic deficits are appreciated. Skin:  Skin is warm, dry and intact. No rash noted. Psychiatric: Mood and affect are normal. Speech and behavior are normal.    ____________________________________________   DIFFERENTIAL includes but not limited to  Acute coronary syndrome, pulmonary embolism, aortic dissection, Boerhaave syndrome, pneumothorax ____________________________________________   LABS (all labs ordered are listed, but only abnormal results are displayed)  Labs Reviewed  BASIC METABOLIC PANEL - Abnormal; Notable for the following:       Result Value   Glucose, Bld 115 (*)    All other components within normal limits  CBC  TROPONIN I  TROPONIN I    No signs of acute ischemia 2 __________________________________________  EKG  ED ECG REPORT I, Merrily Brittle, the attending physician, personally viewed and interpreted this ECG.  Date: 03/02/2017 Rate: 76 Rhythm: normal sinus rhythm QRS Axis: normal Intervals: normal ST/T Wave abnormalities: normal Narrative Interpretation: unremarkable  ____________________________________________  RADIOLOGY  Chest x-ray with no acute disease ____________________________________________   PROCEDURES  Procedure(s) performed:  no  Procedures  Critical Care performed: no  Observation: no ____________________________________________   INITIAL IMPRESSION / ASSESSMENT AND PLAN / ED COURSE  Pertinent labs & imaging results that were available during my care of the patient were reviewed by me and considered in my medical decision making (see chart for details).  By the time I saw the patient he had artery had a first troponin which was negative. The first troponin was 2-1/2 hours ago. He does not want to stay any longer however I convinced him that to fully rule out a myocardial infarction he required 2 troponins at least 3 hours apart. He consented to stay.  Fortunately the patient's second troponin is likewise negative and he would like to go home. He has a stress test scheduled in 2 days. I do not believe he requires inpatient risk stratification. He is discharged home in improved condition.      ____________________________________________   FINAL CLINICAL IMPRESSION(S) / ED DIAGNOSES  Final diagnoses:  Chest pain, unspecified type  NEW MEDICATIONS STARTED DURING THIS VISIT:  Discharge Medication List as of 03/02/2017  6:33 PM       Note:  This document was prepared using Dragon voice recognition software and may include unintentional dictation errors.     Merrily Brittleifenbark, Oluchi Pucci, MD 03/02/17 2322

## 2017-03-02 NOTE — ED Triage Notes (Signed)
Pt arrives via EMS with chest pain and left arm and neck pain, 9/10, upon EMS arrival pt states pain resolved, vitals WDL, hx of CVA and TIA, scheduled for stress test  Thursday

## 2017-03-02 NOTE — ED Triage Notes (Signed)
Says about 1130 had episode of left side chest pain left arm pain left jaw pain and numbness on left side including leg.  All symptoms are gone now.  Is seeing cardiologist to evaluate for CAD currently

## 2017-03-02 NOTE — ED Notes (Signed)
Pt eating orange during discharge.

## 2017-03-03 ENCOUNTER — Telehealth: Payer: Self-pay | Admitting: *Deleted

## 2017-03-03 NOTE — Telephone Encounter (Signed)
Patient verbalized understanding of instructions for treadmill stress test including not to take his metoprolol the night before or morning of and to wear comfortable walking or tennis shoes. He is aware of appt date and time.

## 2017-03-04 ENCOUNTER — Ambulatory Visit (INDEPENDENT_AMBULATORY_CARE_PROVIDER_SITE_OTHER): Payer: Self-pay

## 2017-03-04 ENCOUNTER — Other Ambulatory Visit: Payer: Self-pay

## 2017-03-04 DIAGNOSIS — R079 Chest pain, unspecified: Secondary | ICD-10-CM

## 2017-03-05 LAB — EXERCISE TOLERANCE TEST
CSEPEDS: 57 s
CSEPEW: 8.4 METS
CSEPHR: 90 %
CSEPPHR: 150 {beats}/min
Exercise duration (min): 6 min
MPHR: 165 {beats}/min
Rest HR: 75 {beats}/min

## 2017-03-08 ENCOUNTER — Telehealth: Payer: Self-pay | Admitting: Cardiovascular Disease

## 2017-03-08 NOTE — Telephone Encounter (Signed)
Pt would like stress test results.  

## 2017-03-08 NOTE — Telephone Encounter (Signed)
Results called to pt. Pt verbalized understanding of stress test.

## 2017-03-11 ENCOUNTER — Ambulatory Visit: Payer: Self-pay | Admitting: Adult Health Nurse Practitioner

## 2017-03-11 VITALS — BP 105/67 | HR 67 | Wt 227.0 lb

## 2017-03-11 DIAGNOSIS — E782 Mixed hyperlipidemia: Secondary | ICD-10-CM

## 2017-03-11 DIAGNOSIS — I10 Essential (primary) hypertension: Secondary | ICD-10-CM

## 2017-03-11 DIAGNOSIS — E119 Type 2 diabetes mellitus without complications: Secondary | ICD-10-CM

## 2017-03-11 NOTE — Progress Notes (Signed)
  Patient: Pecola LeisureDanny E Wellman Male    DOB: Apr 22, 1961   56 y.o.   MRN: [id number] Visit Date: 03/11/2017  Today's Provider: Jacelyn Pieah Doles-Johnson, NP   Chief Complaint  Patient presents with  . Diabetes   Subjective:    HPI   Presented to the ED on 7.10 with substernal CP, MI ruled out.  Normal stress test on 7.12.18.  Referred to Cardiology by Carollee HerterShannon, PA on 6.21.18 for CP.  Pt states that he saw Cardiology already and that they stated "everything was alright, I was good to go". Pt states that he still has CP off and on, lasts about 20 mins- relieved with nitro.      Allergies  Allergen Reactions  . Citalopram Nausea Only  . Omeprazole Nausea Only   Previous Medications   ACETAMINOPHEN (TYLENOL) 500 MG TABLET    Take 500 mg by mouth every 6 (six) hours as needed.   ALBUTEROL (PROVENTIL) (2.5 MG/3ML) 0.083% NEBULIZER SOLUTION    Take 2.5 mg by nebulization every 6 (six) hours as needed for wheezing or shortness of breath.   AMLODIPINE (NORVASC) 5 MG TABLET    Take 1 tablet (5 mg total) by mouth daily.   ASPIRIN 81 MG CHEWABLE TABLET    Chew 1 tablet (81 mg total) by mouth daily.   ATORVASTATIN (LIPITOR) 40 MG TABLET    Take 1 tablet (40 mg total) by mouth daily.   GLIPIZIDE (GLUCOTROL) 10 MG TABLET    Take 10 mg by mouth daily before breakfast.   LISINOPRIL (PRINIVIL,ZESTRIL) 20 MG TABLET    Take 1 tablet (20 mg total) by mouth daily.   METFORMIN (GLUCOPHAGE) 500 MG TABLET    TAKE ONE TABLET BY MOUTH 2 TIMES A DAY   METOPROLOL TARTRATE (LOPRESSOR) 25 MG TABLET    Take 1 tablet (25 mg total) by mouth 2 (two) times daily.   OMEPRAZOLE (PRILOSEC) 20 MG CAPSULE    Take 1 capsule (20 mg total) by mouth daily.    Review of Systems  All other systems reviewed and are negative.   Social History  Substance Use Topics  . Smoking status: Former Smoker    Years: 10.00    Types: Cigarettes    Quit date: 12/18/2015  . Smokeless tobacco: Never Used  . Alcohol use No   Objective:   BP  105/67 (BP Location: Left Arm, Patient Position: Sitting)   Pulse 67   Wt 227 lb (103 kg)   BMI 32.57 kg/m   Physical Exam  Constitutional: He appears well-developed and well-nourished.  HENT:  Head: Normocephalic and atraumatic.  Neck: Normal range of motion. Neck supple.  Cardiovascular: Normal rate, regular rhythm and normal heart sounds.   Pulmonary/Chest: Effort normal and breath sounds normal.  Skin: Skin is warm and dry.  Vitals reviewed.       Assessment & Plan:         Continue to FU with Cardiology.  ER precautions given.   Jacelyn Pieah Doles-Johnson, NP   [facility] [place]

## 2017-04-15 ENCOUNTER — Other Ambulatory Visit: Payer: Self-pay | Admitting: Internal Medicine

## 2017-04-20 ENCOUNTER — Telehealth: Payer: Self-pay

## 2017-04-20 NOTE — Telephone Encounter (Signed)
Received PAP application from [facility] for Ventolin placed for provider to sign. 

## 2017-04-20 NOTE — Telephone Encounter (Signed)
Placed signed application/script in [facility] folder for pickup. 

## 2017-04-28 ENCOUNTER — Telehealth: Payer: Self-pay | Admitting: Pharmacist

## 2017-04-28 NOTE — Telephone Encounter (Signed)
04/28/17 Placed refill online with [facility] for Ventolin HFA, to release 05/11/17, order# [id number].Forde [email or web address]

## 2017-05-04 ENCOUNTER — Telehealth: Payer: Self-pay | Admitting: Pharmacist

## 2017-05-04 ENCOUNTER — Telehealth: Payer: Self-pay | Admitting: Adult Health Nurse Practitioner

## 2017-05-04 NOTE — Telephone Encounter (Signed)
Call back. Complaining about hip and leg

## 2017-05-04 NOTE — Telephone Encounter (Signed)
05/04/17 Faxed [facility] application for Ventolin HFA 90mcg Inhale 2 puffs every four hours as needed, replaces Albuterol, this is for annual renewal.AJ

## 2017-05-06 ENCOUNTER — Telehealth: Payer: Self-pay

## 2017-05-06 NOTE — Telephone Encounter (Signed)
Called pt back to make appt. appt made.

## 2017-05-13 ENCOUNTER — Other Ambulatory Visit: Payer: Self-pay

## 2017-05-13 DIAGNOSIS — E119 Type 2 diabetes mellitus without complications: Secondary | ICD-10-CM

## 2017-05-13 DIAGNOSIS — I10 Essential (primary) hypertension: Secondary | ICD-10-CM

## 2017-05-13 DIAGNOSIS — E782 Mixed hyperlipidemia: Secondary | ICD-10-CM

## 2017-05-14 LAB — COMPREHENSIVE METABOLIC PANEL
ALT: 25 IU/L (ref 0–44)
AST: 17 IU/L (ref 0–40)
Albumin/Globulin Ratio: 1.5 (ref 1.2–2.2)
Albumin: 4.3 g/dL (ref 3.5–5.5)
Alkaline Phosphatase: 85 IU/L (ref 39–117)
BUN/Creatinine Ratio: 17 (ref 9–20)
BUN: 17 mg/dL (ref 6–24)
Bilirubin Total: 0.3 mg/dL (ref 0.0–1.2)
CALCIUM: 9.8 mg/dL (ref 8.7–10.2)
CO2: 24 mmol/L (ref 20–29)
CREATININE: 0.99 mg/dL (ref 0.76–1.27)
Chloride: 104 mmol/L (ref 96–106)
GFR calc Af Amer: 99 mL/min/{1.73_m2} (ref >59)
GFR, EST NON AFRICAN AMERICAN: 85 mL/min/{1.73_m2} (ref >59)
GLOBULIN, TOTAL: 2.8 g/dL (ref 1.5–4.5)
GLUCOSE: 126 mg/dL — AB (ref 65–99)
Potassium: 4.7 mmol/L (ref 3.5–5.2)
SODIUM: 142 mmol/L (ref 134–144)
Total Protein: 7.1 g/dL (ref 6.0–8.5)

## 2017-05-14 LAB — LIPID PANEL
Chol/HDL Ratio: 3.2 ratio (ref 0.0–5.0)
Cholesterol, Total: 120 mg/dL (ref 100–199)
HDL: 38 mg/dL — AB (ref >39)
LDL Calculated: 50 mg/dL (ref 0–99)
Triglycerides: 159 mg/dL — ABNORMAL HIGH (ref 0–149)
VLDL Cholesterol Cal: 32 mg/dL (ref 5–40)

## 2017-05-14 LAB — HEMOGLOBIN A1C
Est. average glucose Bld gHb Est-mCnc: 126 mg/dL
HEMOGLOBIN A1C: 6 % — AB (ref 4.8–5.6)

## 2017-05-20 ENCOUNTER — Ambulatory Visit: Payer: Self-pay | Admitting: Family Medicine

## 2017-05-20 ENCOUNTER — Encounter: Payer: Self-pay | Admitting: Family Medicine

## 2017-05-20 VITALS — BP 129/81 | HR 77 | Temp 98.0°F | Wt 220.9 lb

## 2017-05-20 DIAGNOSIS — I10 Essential (primary) hypertension: Secondary | ICD-10-CM

## 2017-05-20 DIAGNOSIS — Z6831 Body mass index (BMI) 31.0-31.9, adult: Secondary | ICD-10-CM

## 2017-05-20 DIAGNOSIS — R079 Chest pain, unspecified: Secondary | ICD-10-CM

## 2017-05-20 DIAGNOSIS — E119 Type 2 diabetes mellitus without complications: Secondary | ICD-10-CM

## 2017-05-20 DIAGNOSIS — E6609 Other obesity due to excess calories: Secondary | ICD-10-CM

## 2017-05-20 DIAGNOSIS — M25551 Pain in right hip: Secondary | ICD-10-CM

## 2017-05-20 DIAGNOSIS — E782 Mixed hyperlipidemia: Secondary | ICD-10-CM

## 2017-05-20 LAB — GLUCOSE, POCT (MANUAL RESULT ENTRY): POC GLUCOSE: 113 mg/dL — AB (ref 70–99)

## 2017-05-20 NOTE — Progress Notes (Addendum)
Name: Roy Norton   MRN: [id number]    DOB: 03-Apr-1961   Date:05/20/2017       Progress Note  Subjective  Chief Complaint  No chief complaint on file.   HPI  CBG 113  Right Hip Pain:  Ongoing for one month, no recent injury.  Has tried some stretching; has tried a muscle relaxer and that did not help; advil seems to help a moderate amount. Radiation into RIGHT buttock.  No leg weakness numbness/tingling.  HTN: Does not check at home; is compliant with medications - taking Meoprolol, Lisinopril, and Amlodipine as prescribed.  He endorses chronic migraines but denies abnormal headaches, no BLE edema, blurred vision, no chest pain since his visit to the ER, and no increased shortness of breath outside of his baseline.  Does not use salt at all; mother does most of the cooking, no fried foods, does eat fruits and vegetables now.  BP is at goal today.  Chest Pain at Rest: Has not had ANY chest pain since his last visit with [place], says because he was feeling well he did not go to see the cardiologist, and does not want to see him unless he has symptoms again. LDL at goal on 05/13/2017, HDL low at 38, TGD's slightly elevated at 159.  Hyperlipidemia: Taking ASA 25m daily, on atorvastatin 427mdaily and is compliant. Denies myalgias.  Obesity: Lost 7lbs in the last 2 months - he has been working hard to eat healthy and has been walking daily. Not eating any salt, no fried/fatty foods  DM: Trying to eat a healthier diet lately, has been walking more.  BG today is 113.  Checks home BG weekly - 98 lowest, 159 highest, average is about 120.  Doing well on Metformin and Glipizide. Discussed what to do in the event of hypoglycemia.  A1C and CMP reviewed from 05/13/2017 - A1C 6.0%, CMP unremarkable.  Patient Active Problem List   Diagnosis Date Noted  . Migraine 12/12/2015  . History of tobacco abuse 04/30/2015  . Hyperlipidemia 04/30/2015  . Essential hypertension 04/30/2015  . Diabetes mellitus  [place] complication [id number]. Obesity 04/30/2015  . Chest pain at rest 04/28/2015  . Dizziness 04/11/2015    Past Surgical History:  Procedure Laterality Date  . CARDIAC CATHETERIZATION  2013    Family History  Problem Relation Age of Onset  . Hypertension Mother   . Hypertension Father   . Diabetes Father     Social History   Social History  . Marital status: Single    Spouse name: N/A  . Number of children: N/A  . Years of education: N/A   Occupational History  . Not on file.   Social History Main Topics  . Smoking status: Former Smoker    Years: 10.00    Types: Cigarettes    Quit date: 12/18/2015  . Smokeless tobacco: Never Used  . Alcohol use No  . Drug use: No  . Sexual activity: Not on file   Other Topics Concern  . Not on file   Social History Narrative  . No narrative on file     Current Outpatient Prescriptions:  .  acetaminophen (TYLENOL) 500 MG tablet, Take 500 mg by mouth every 6 (six) hours as needed., Disp: , Rfl:  .  amLODipine (NORVASC) 5 MG tablet, Take 1 tablet (5 mg total) by mouth daily., Disp: 30 tablet, Rfl: 5 .  aspirin 81 MG chewable tablet, Chew 1 tablet (81 mg total) by  mouth daily., Disp: 90 tablet, Rfl: 1 .  atorvastatin (LIPITOR) 40 MG tablet, Take 1 tablet (40 mg total) by mouth daily., Disp: 90 tablet, Rfl: 1 .  glipiZIDE (GLUCOTROL) 10 MG tablet, Take 10 mg by mouth daily before breakfast., Disp: , Rfl:  .  lisinopril (PRINIVIL,ZESTRIL) 20 MG tablet, Take 1 tablet (20 mg total) by mouth daily., Disp: 90 tablet, Rfl: 3 .  metFORMIN (GLUCOPHAGE) 500 MG tablet, TAKE ONE TABLET BY MOUTH 2 TIMES A DAY, Disp: 180 tablet, Rfl: 0 .  metoprolol tartrate (LOPRESSOR) 25 MG tablet, Take 1 tablet (25 mg total) by mouth 2 (two) times daily., Disp: 180 tablet, Rfl: 3 .  albuterol (PROVENTIL) (2.5 MG/3ML) 0.083% nebulizer solution, Take 2.5 mg by nebulization every 6 (six) hours as needed for wheezing or shortness of breath., Disp: ,  Rfl:  .  omeprazole (PRILOSEC) 20 MG capsule, Take 1 capsule (20 mg total) by mouth daily., Disp: 90 capsule, Rfl: 1  Allergies  Allergen Reactions  . Citalopram Nausea Only  . Omeprazole Nausea Only     ROS  Constitutional: Negative for fever or weight change.  Respiratory: Negative for cough and shortness of breath.   Cardiovascular: Negative for chest pain or palpitations.  Gastrointestinal: Negative for abdominal pain, no bowel changes.  Musculoskeletal: Negative for gait problem or joint swelling.  Skin: Negative for rash.  Neurological: Negative for dizziness or headache.  No other specific complaints in a complete review of systems (except as listed in HPI above).  Objective  Vitals:   05/20/17 1849  BP: 129/81  Pulse: 77  Temp: 98 F (36.7 C)  TempSrc: Oral  Weight: 220 lb 14.4 oz (100.2 kg)   Body mass index is 31.7 kg/m.  Physical Exam  Constitutional: Patient appears well-developed and well-nourished. Obese. No distress.  HEENT: head atraumatic, normocephalic, pupils equal and reactive to light,  Cardiovascular: Normal rate, regular rhythm and normal heart sounds.  No murmur heard. No BLE edema. Pulmonary/Chest: Effort normal and breath sounds normal. No respiratory distress. Abdominal: Soft.  There is no tenderness. Psychiatric: Patient has a normal mood and affect. behavior is normal. Judgment and thought content normal. Musculoskeletal: Normal range of motion, no joint effusions. No gross deformities. No bony tenderness. Has moderate  pain with active extension and flexion of RIGHT hip joint.  No crepitus. Neurological: he is alert and oriented to person, place, and time. No cranial nerve deficit. Coordination, balance, strength, speech and gait are normal.    Recent Results (from the past 2160 hour(s))  Basic metabolic panel     Status: Abnormal   Collection Time: 03/02/17  2:37 PM  Result Value Ref Range   Sodium 140 135 - 145 mmol/L   Potassium 3.9  3.5 - 5.1 mmol/L   Chloride 108 101 - 111 mmol/L   CO2 24 22 - 32 mmol/L   Glucose, Bld 115 (H) 65 - 99 mg/dL   BUN 17 6 - 20 mg/dL   Creatinine, Ser 0.77 0.61 - 1.24 mg/dL   Calcium 9.8 8.9 - 10.3 mg/dL   GFR calc non Af Amer >60 >60 mL/min   GFR calc Af Amer >60 >60 mL/min    Comment: (NOTE) The eGFR has been calculated using the CKD EPI equation. This calculation has not been validated in all clinical situations. eGFR's persistently <60 mL/min signify possible Chronic Kidney Disease.    Anion gap 8 5 - 15  CBC     Status: None   Collection Time:  03/02/17  2:37 PM  Result Value Ref Range   WBC 6.8 3.8 - 10.6 K/uL   RBC 4.79 4.40 - 5.90 MIL/uL   Hemoglobin 14.7 13.0 - 18.0 g/dL   HCT 42.7 40.0 - 52.0 %   MCV 89.2 80.0 - 100.0 fL   MCH 30.8 26.0 - 34.0 pg   MCHC 34.5 32.0 - 36.0 g/dL   RDW 14.0 11.5 - 14.5 %   Platelets 194 150 - 440 K/uL  Troponin I     Status: None   Collection Time: 03/02/17  2:37 PM  Result Value Ref Range   Troponin I <0.03 <0.03 ng/mL  Troponin I     Status: None   Collection Time: 03/02/17  5:53 PM  Result Value Ref Range   Troponin I <0.03 <0.03 ng/mL  Exercise Tolerance Test     Status: None   Collection Time: 03/04/17 11:04 AM  Result Value Ref Range   Rest HR 75 bpm   Rest BP 112/78 mmHg   Exercise duration (sec) 57 sec   Percent HR 90 %   Exercise duration (min) 6 min   Estimated workload 8.4 METS   Peak HR 150 bpm   Peak BP 184/95 mmHg   MPHR 165 bpm  Lipid panel     Status: Abnormal   Collection Time: 05/13/17  6:29 PM  Result Value Ref Range   Cholesterol, Total 120 100 - 199 mg/dL   Triglycerides 159 (H) 0 - 149 mg/dL   HDL 38 (L) >39 mg/dL   VLDL Cholesterol Cal 32 5 - 40 mg/dL   LDL Calculated 50 0 - 99 mg/dL   Chol/HDL Ratio 3.2 0.0 - 5.0 ratio    Comment:                                   T. Chol/HDL Ratio                                             Men  Women                               1/2 Avg.Risk  3.4    3.3                                    Avg.Risk  5.0    4.4                                2X Avg.Risk  9.6    7.1                                3X Avg.Risk 23.4   11.0   Hemoglobin A1c     Status: Abnormal   Collection Time: 05/13/17  6:29 PM  Result Value Ref Range   Hgb A1c MFr Bld 6.0 (H) 4.8 - 5.6 %    Comment:          Prediabetes: 5.7 - 6.4          Diabetes: >6.4  Glycemic control for adults with diabetes: <7.0    Est. average glucose Bld gHb Est-mCnc 126 mg/dL  Comp Met (CMET)     Status: Abnormal   Collection Time: 05/13/17  6:29 PM  Result Value Ref Range   Glucose 126 (H) 65 - 99 mg/dL   BUN 17 6 - 24 mg/dL   Creatinine, Ser 0.99 0.76 - 1.27 mg/dL   GFR calc non Af Amer 85 >59 mL/min/1.73   GFR calc Af Amer 99 >59 mL/min/1.73   BUN/Creatinine Ratio 17 9 - 20   Sodium 142 134 - 144 mmol/L   Potassium 4.7 3.5 - 5.2 mmol/L   Chloride 104 96 - 106 mmol/L   CO2 24 20 - 29 mmol/L   Calcium 9.8 8.7 - 10.2 mg/dL   Total Protein 7.1 6.0 - 8.5 g/dL   Albumin 4.3 3.5 - 5.5 g/dL   Globulin, Total 2.8 1.5 - 4.5 g/dL   Albumin/Globulin Ratio 1.5 1.2 - 2.2   Bilirubin Total 0.3 0.0 - 1.2 mg/dL   Alkaline Phosphatase 85 39 - 117 IU/L   AST 17 0 - 40 IU/L   ALT 25 0 - 44 IU/L  POCT Glucose (CBG)     Status: Abnormal   Collection Time: 05/20/17  6:57 PM  Result Value Ref Range   POC Glucose 113 (A) 70 - 99 mg/dl    Diabetic Foot Exam: Diabetic Foot Exam - Simple   Simple Foot Form Visual Inspection No deformities, no ulcerations, no other skin breakdown bilaterally:  Yes Sensation Testing Intact to touch and monofilament testing bilaterally:  Yes Pulse Check Posterior Tibialis and Dorsalis pulse intact bilaterally:  Yes Comments Bilateral toenails with onychomycosis - thickened, discolored toenails bilaterally.  Pt declines treatment or referral at this time     Assessment & Plan  1. Diabetes mellitus without complication ([place]) - POCT Glucose (CBG) - Continue  medications, continue diabetic diet  2. Essential hypertension Continue low salt diet  3. Chest pain at rest - Will consider Cardiolgy referral if symptoms return, does not want to go at this time  4. Mixed hyperlipidemia Discussed importance of 150 minutes of physical activity weekly, eat two servings of fish weekly, eat one serving of tree nuts ( cashews, pistachios, pecans, almonds.Marland Kitchen) every other day, eat 6 servings of fruit/vegetables daily and drink plenty of water and avoid sweet beverages.  Continue Statin therapy and Aspirin therapy  6. Right hip pain - Tylenol PRN for pain - Gentle stretching and heat as tolerated, advance to exercises as tolerated. - Orthopedist at [facility] to see patient - will schedule with front desk staff today.  ---------------------------------- I have reviewed this encounter including the documentation in this note and/or discussed this patient with the Johney Maine, FNP, NP-C. I am certifying that I agree with the content of this note as supervising physician.  Enid Derry, [facility] 06/02/2017, 2:00 PM

## 2017-05-20 NOTE — Patient Instructions (Addendum)
Please schedule appointment with [facility] for Right Hip Pain.  Sciatica Rehab Ask your health care provider which exercises are safe for you. Do exercises exactly as told by your health care provider and adjust them as directed. It is normal to feel mild stretching, pulling, tightness, or discomfort as you do these exercises, but you should stop right away if you feel sudden pain or your pain gets worse.Do not begin these exercises until told by your health care provider. Stretching and range of motion exercises These exercises warm up your muscles and joints and improve the movement and flexibility of your hips and your back. These exercises also help to relieve pain, numbness, and tingling. Exercise A: Sciatic nerve glide 1. Sit in a chair with your head facing down toward your chest. Place your hands behind your back. Let your shoulders slump forward. 2. Slowly straighten one of your knees while you tilt your head back as if you are looking toward the ceiling. Only straighten your leg as far as you can without making your symptoms worse. 3. Hold for __________ seconds. 4. Slowly return to the starting position. 5. Repeat with your other leg. Repeat __________ times. Complete this exercise __________ times a day. Exercise B: Knee to chest with hip adduction and internal rotation  1. Lie on your back on a firm surface with both legs straight. 2. Bend one of your knees and move it up toward your chest until you feel a gentle stretch in your lower back and buttock. Then, move your knee toward the shoulder that is on the opposite side from your leg. ? Hold your leg in this position by holding onto the front of your knee. 3. Hold for __________ seconds. 4. Slowly return to the starting position. 5. Repeat with your other leg. Repeat __________ times. Complete this exercise __________ times a day. Exercise C: Prone extension on elbows  1. Lie on your abdomen on a firm  surface. A bed may be too soft for this exercise. 2. Prop yourself up on your elbows. 3. Use your arms to help lift your chest up until you feel a gentle stretch in your abdomen and your lower back. ? This will place some of your body weight on your elbows. If this is uncomfortable, try stacking pillows under your chest. ? Your hips should stay down, against the surface that you are lying on. Keep your hip and back muscles relaxed. 4. Hold for __________ seconds. 5. Slowly relax your upper body and return to the starting position. Repeat __________ times. Complete this exercise __________ times a day. Strengthening exercises These exercises build strength and endurance in your back. Endurance is the ability to use your muscles for a long time, even after they get tired. Exercise D: Pelvic tilt 1. Lie on your back on a firm surface. Bend your knees and keep your feet flat. 2. Tense your abdominal muscles. Tip your pelvis up toward the ceiling and flatten your lower back into the floor. ? To help with this exercise, you may place a small towel under your lower back and try to push your back into the towel. 3. Hold for __________ seconds. 4. Let your muscles relax completely before you repeat this exercise. Repeat __________ times. Complete this exercise __________ times a day. Exercise E: Alternating arm and leg raises  1. Get on your hands and knees on a firm surface. If you are on a hard floor, you may want to use padding to  cushion your knees, such as an exercise mat. 2. Line up your arms and legs. Your hands should be below your shoulders, and your knees should be below your hips. 3. Lift your left leg behind you. At the same time, raise your right arm and straighten it in front of you. ? Do not lift your leg higher than your hip. ? Do not lift your arm higher than your shoulder. ? Keep your abdominal and back muscles tight. ? Keep your hips facing the ground. ? Do not arch your  back. ? Keep your balance carefully, and do not hold your breath. 4. Hold for __________ seconds. 5. Slowly return to the starting position and repeat with your right leg and your left arm. Repeat __________ times. Complete this exercise __________ times a day. Posture and body mechanics  Body mechanics refers to the movements and positions of your body while you do your daily activities. Posture is part of body mechanics. Good posture and healthy body mechanics can help to relieve stress in your body's tissues and joints. Good posture means that your spine is in its natural S-curve position (your spine is neutral), your shoulders are pulled back slightly, and your head is not tipped forward. The following are general guidelines for applying improved posture and body mechanics to your everyday activities. Standing   When standing, keep your spine neutral and your feet about hip-width apart. Keep a slight bend in your knees. Your ears, shoulders, and hips should line up.  When you do a task in which you stand in one place for a long time, place one foot up on a stable object that is 2-4 inches (5-10 cm) high, such as a footstool. This helps keep your spine neutral. Sitting   When sitting, keep your spine neutral and keep your feet flat on the floor. Use a footrest, if necessary, and keep your thighs parallel to the floor. Avoid rounding your shoulders, and avoid tilting your head forward.  When working at a desk or a computer, keep your desk at a height where your hands are slightly lower than your elbows. Slide your chair under your desk so you are close enough to maintain good posture.  When working at a computer, place your monitor at a height where you are looking straight ahead and you do not have to tilt your head forward or downward to look at the screen. Resting   When lying down and resting, avoid positions that are most painful for you.  If you have pain with activities such as  sitting, bending, stooping, or squatting (flexion-based activities), lie in a position in which your body does not bend very much. For example, avoid curling up on your side with your arms and knees near your chest (fetal position).  If you have pain with activities such as standing for a long time or reaching with your arms (extension-based activities), lie with your spine in a neutral position and bend your knees slightly. Try the following positions: ? Lying on your side with a pillow between your knees. ? Lying on your back with a pillow under your knees. Lifting   When lifting objects, keep your feet at least shoulder-width apart and tighten your abdominal muscles.  Bend your knees and hips and keep your spine neutral. It is important to lift using the strength of your legs, not your back. Do not lock your knees straight out.  Always ask for help to lift heavy or awkward objects. This information  is not intended to replace advice given to you by your health care provider. Make sure you discuss any questions you have with your health care provider. Document Released: 08/10/2005 Document Revised: 04/16/2016 Document Reviewed: 04/26/2015 Elsevier Interactive Patient Education  [employer].

## 2017-05-26 ENCOUNTER — Ambulatory Visit: Payer: Self-pay | Admitting: Specialist

## 2017-05-26 DIAGNOSIS — M5430 Sciatica, unspecified side: Secondary | ICD-10-CM

## 2017-05-26 MED ORDER — MELOXICAM 15 MG PO TABS: 15.0000 mg | ORAL_TABLET | Freq: Every day | ORAL | 2 refills | Status: DC

## 2017-05-26 NOTE — Progress Notes (Signed)
   Subjective:    Patient ID: Roy Norton, male    DOB: 20-Jun-1961, 56 y.o.   MRN: [id number]  HPI 1 month hx of rt buttock pain with radiation posterior leg down to plantar aspect of foot. Took mother's muscle relaxant but did not help. Minimum N/T.    Review of Systems     Objective:   Physical Exam Gait slight limp; not antalgic.Heel/ toe okay. Tandem gait okay. 0 TTP. He is able to march in place with normal muscle recruitment and relaxation. Trendelenburg sign neg. L/S ROM very good. DTR's knees and ankles 2+ =. Toe signs downward. MMT 5/5. Sens intact. Seated SLR neg. In supine position FROM of bil hips.    Plan:Mobic 15 mg daily. Pt will try the course of Mobic for 3 weeks. If not better pt is to call and let Angelica Chessman know. We will then send in xray order and have pt get them and then make an appt to see Dr. Justice Rocher again.

## 2017-06-14 ENCOUNTER — Other Ambulatory Visit: Payer: Self-pay | Admitting: Internal Medicine

## 2017-08-19 ENCOUNTER — Ambulatory Visit: Payer: Self-pay | Admitting: Family Medicine

## 2017-08-19 VITALS — BP 147/94 | HR 77 | Temp 99.0°F | Wt 223.6 lb

## 2017-08-19 DIAGNOSIS — E119 Type 2 diabetes mellitus without complications: Secondary | ICD-10-CM

## 2017-08-19 DIAGNOSIS — I10 Essential (primary) hypertension: Secondary | ICD-10-CM

## 2017-08-19 DIAGNOSIS — Z09 Encounter for follow-up examination after completed treatment for conditions other than malignant neoplasm: Secondary | ICD-10-CM

## 2017-08-19 DIAGNOSIS — K219 Gastro-esophageal reflux disease without esophagitis: Secondary | ICD-10-CM

## 2017-08-19 MED ORDER — ATORVASTATIN CALCIUM 40 MG PO TABS: 40.0000 mg | ORAL_TABLET | Freq: Every day | ORAL | 1 refills | Status: DC

## 2017-08-19 MED ORDER — GABAPENTIN 300 MG PO CAPS: 300.0000 mg | ORAL_CAPSULE | Freq: Two times a day (BID) | ORAL | 2 refills | Status: DC

## 2017-08-19 MED ORDER — METFORMIN HCL 500 MG PO TABS: 500.0000 mg | ORAL_TABLET | Freq: Two times a day (BID) | ORAL | 0 refills | Status: DC

## 2017-08-19 MED ORDER — AMITRIPTYLINE HCL 50 MG PO TABS: 100.0000 mg | ORAL_TABLET | Freq: Every day | ORAL | 3 refills | Status: DC

## 2017-08-19 MED ORDER — OMEPRAZOLE 20 MG PO CPDR: 20.0000 mg | DELAYED_RELEASE_CAPSULE | Freq: Every day | ORAL | 1 refills | Status: DC

## 2017-08-19 NOTE — Progress Notes (Signed)
Patient: Roy Norton Male    DOB: Aug 27, 1960   56 y.o.   MRN: [id number] Visit Date: 08/19/2017  Today's Provider: Kallie LocksNatalie M Jacobe Study, FNP   Chief Complaint  Patient presents with  . Diabetes  . Hypertension   Subjective:    HPI  Patient is here today for follow up and refills. States that he is doing well with no complaints.    Allergies  Allergen Reactions  . Citalopram Nausea Only  . Omeprazole Nausea Only   Allergies as of 08/19/2017      Reactions   Citalopram Nausea Only   Omeprazole Nausea Only      Medication List        Accurate as of 08/19/17  9:00 PM. Always use your most recent med list.          acetaminophen 500 MG tablet Commonly known as:  TYLENOL Take 500 mg by mouth every 6 (six) hours as needed.   albuterol (2.5 MG/3ML) 0.083% nebulizer solution Commonly known as:  PROVENTIL Take 2.5 mg by nebulization every 6 (six) hours as needed for wheezing or shortness of breath.   amitriptyline 50 MG tablet Commonly known as:  ELAVIL Take 2 tablets (100 mg total) by mouth at bedtime.   amLODipine 5 MG tablet Commonly known as:  NORVASC TAKE ONE TABLET BY MOUTH EVERY DAY   aspirin 81 MG chewable tablet Chew 1 tablet (81 mg total) by mouth daily.   atorvastatin 40 MG tablet Commonly known as:  LIPITOR Take 1 tablet (40 mg total) by mouth daily.   gabapentin 300 MG capsule Commonly known as:  NEURONTIN Take 1 capsule (300 mg total) by mouth 2 (two) times daily.   glipiZIDE 10 MG tablet Commonly known as:  GLUCOTROL Take 10 mg by mouth daily before breakfast.   glipiZIDE 5 MG tablet Commonly known as:  GLUCOTROL Take 5 mg by mouth daily before breakfast.   lisinopril 20 MG tablet Commonly known as:  PRINIVIL,ZESTRIL Take 1 tablet (20 mg total) by mouth daily.   meloxicam 15 MG tablet Commonly known as:  MOBIC Take 1 tablet (15 mg total) by mouth daily.   metFORMIN 500 MG tablet Commonly known as:  GLUCOPHAGE Take 1 tablet (500 mg  total) by mouth 2 (two) times daily with a meal.   metoprolol tartrate 25 MG tablet Commonly known as:  LOPRESSOR Take 1 tablet (25 mg total) by mouth 2 (two) times daily.   omeprazole 20 MG capsule Commonly known as:  PRILOSEC Take 1 capsule (20 mg total) by mouth daily.       Review of Systems  All other systems reviewed and are negative.   Social History   Tobacco Use  . Smoking status: Former Smoker    Years: 10.00    Types: Cigarettes    Last attempt to quit: 12/18/2015    Years since quitting: 1.6  . Smokeless tobacco: Never Used  Substance Use Topics  . Alcohol use: No    Alcohol/week: 0.0 oz   Objective:   BP (!) 147/94 (BP Location: Left Arm, Patient Position: Sitting, Cuff Size: Large)   Pulse 77   Temp 99 F (37.2 C)   Wt 223 lb 9.6 oz (101.4 kg)   BMI 32.08 kg/m   Physical Exam  Constitutional: He is oriented to person, place, and time. He appears well-developed and well-nourished.  HENT:  Head: Normocephalic and atraumatic.  Right Ear: External ear normal.  Left Ear: External ear normal.  Mouth/Throat: Oropharynx is clear and moist.  Eyes: Conjunctivae and EOM are normal. Pupils are equal, round, and reactive to light.  Neck: Normal range of motion. Neck supple.  Cardiovascular: Normal rate, regular rhythm, normal heart sounds and intact distal pulses.  Pulmonary/Chest: Effort normal and breath sounds normal.  Abdominal: He exhibits distension.  Musculoskeletal: Normal range of motion.  Neurological: He is alert and oriented to person, place, and time. He has normal reflexes.  Skin: Skin is warm and dry.  Psychiatric: He has a normal mood and affect. His behavior is normal. Judgment and thought content normal.       Assessment & Plan:   1. Essential hypertension BP = 147/94 today. Continue Amlodipine, Metoprolol, and Lisinipril.   2. Diabetes mellitus without complication (H[facility]) Last Hgb A1c on 05/13/2017 = 6.0. Continue Glucophage and  Glipizide as directed. Monitor.   3. Gastroesophageal reflux disease, esophagitis presence not specified Stable. Continue Prilosec as directed. Monitor.   4. Follow up Follow up in 3 months for Labs/OV.   Kallie LocksNatalie M Yilia Sacca, FNP   [facility]

## 2017-09-22 ENCOUNTER — Telehealth: Payer: Self-pay | Admitting: Pharmacist

## 2017-09-22 NOTE — Telephone Encounter (Signed)
09/22/17 Placed refill online with [facility] for Ventolin HFA, to release 10/04/17, order# [id number].Forde RadonAJ

## 2017-10-25 ENCOUNTER — Other Ambulatory Visit: Payer: Self-pay | Admitting: Internal Medicine

## 2017-10-25 DIAGNOSIS — I10 Essential (primary) hypertension: Secondary | ICD-10-CM

## 2017-11-18 ENCOUNTER — Other Ambulatory Visit: Payer: Self-pay

## 2017-11-18 DIAGNOSIS — Z0000 Encounter for general adult medical examination without abnormal findings: Secondary | ICD-10-CM

## 2017-11-19 LAB — COMPREHENSIVE METABOLIC PANEL
A/G RATIO: 1.6 (ref 1.2–2.2)
ALK PHOS: 91 IU/L (ref 39–117)
ALT: 34 IU/L (ref 0–44)
AST: 23 IU/L (ref 0–40)
Albumin: 4.6 g/dL (ref 3.5–5.5)
BUN/Creatinine Ratio: 17 (ref 9–20)
BUN: 21 mg/dL (ref 6–24)
Bilirubin Total: 0.4 mg/dL (ref 0.0–1.2)
CALCIUM: 9.8 mg/dL (ref 8.7–10.2)
CHLORIDE: 106 mmol/L (ref 96–106)
CO2: 23 mmol/L (ref 20–29)
Creatinine, Ser: 1.25 mg/dL (ref 0.76–1.27)
GFR calc Af Amer: 74 mL/min/{1.73_m2} (ref >59)
GFR calc non Af Amer: 64 mL/min/{1.73_m2} (ref >59)
Globulin, Total: 2.8 g/dL (ref 1.5–4.5)
Glucose: 146 mg/dL — ABNORMAL HIGH (ref 65–99)
POTASSIUM: 4.4 mmol/L (ref 3.5–5.2)
Sodium: 144 mmol/L (ref 134–144)
Total Protein: 7.4 g/dL (ref 6.0–8.5)

## 2017-11-19 LAB — HEMOGLOBIN A1C
ESTIMATED AVERAGE GLUCOSE: 137 mg/dL
HEMOGLOBIN A1C: 6.4 % — AB (ref 4.8–5.6)

## 2017-11-19 LAB — LIPID PANEL
Chol/HDL Ratio: 3.3 ratio (ref 0.0–5.0)
Cholesterol, Total: 145 mg/dL (ref 100–199)
HDL: 44 mg/dL (ref >39)
LDL Calculated: 65 mg/dL (ref 0–99)
Triglycerides: 178 mg/dL — ABNORMAL HIGH (ref 0–149)
VLDL Cholesterol Cal: 36 mg/dL (ref 5–40)

## 2017-11-19 LAB — TSH: TSH: 0.54 u[IU]/mL (ref 0.450–4.500)

## 2017-11-22 ENCOUNTER — Telehealth: Payer: Self-pay | Admitting: Pharmacist

## 2017-11-22 NOTE — Telephone Encounter (Signed)
11/22/2017 8:37:55 AM - Ventolin HFA refill  11/22/17 Placed refill online with [facility] for Ventolin HFA, to release 12/03/17, order# [id number].Forde RadonAJ

## 2017-11-25 ENCOUNTER — Other Ambulatory Visit: Payer: Self-pay | Admitting: Adult Health Nurse Practitioner

## 2017-11-25 ENCOUNTER — Other Ambulatory Visit: Payer: Self-pay

## 2017-11-25 MED ORDER — GLIPIZIDE 5 MG PO TABS: 5.0000 mg | ORAL_TABLET | Freq: Every day | ORAL | 3 refills | Status: DC

## 2017-11-25 MED ORDER — GLIPIZIDE 10 MG PO TABS: 10.0000 mg | ORAL_TABLET | Freq: Every day | ORAL | 3 refills | Status: DC

## 2017-12-21 ENCOUNTER — Ambulatory Visit: Payer: Self-pay | Admitting: Adult Health Nurse Practitioner

## 2017-12-21 VITALS — BP 146/91 | HR 70 | Temp 98.2°F | Wt 222.0 lb

## 2017-12-21 DIAGNOSIS — I10 Essential (primary) hypertension: Secondary | ICD-10-CM

## 2017-12-21 DIAGNOSIS — E782 Mixed hyperlipidemia: Secondary | ICD-10-CM

## 2017-12-21 DIAGNOSIS — E119 Type 2 diabetes mellitus without complications: Secondary | ICD-10-CM

## 2017-12-21 MED ORDER — AMLODIPINE BESYLATE 5 MG PO TABS: 5.0000 mg | ORAL_TABLET | Freq: Every day | ORAL | 0 refills | Status: DC

## 2017-12-21 MED ORDER — LISINOPRIL 20 MG PO TABS: 20.0000 mg | ORAL_TABLET | Freq: Every day | ORAL | 0 refills | Status: DC

## 2017-12-21 MED ORDER — METOPROLOL TARTRATE 25 MG PO TABS: 25.0000 mg | ORAL_TABLET | Freq: Two times a day (BID) | ORAL | 3 refills | Status: DC

## 2017-12-21 MED ORDER — METFORMIN HCL 500 MG PO TABS: 500.0000 mg | ORAL_TABLET | Freq: Two times a day (BID) | ORAL | 0 refills | Status: DC

## 2017-12-21 MED ORDER — ATORVASTATIN CALCIUM 40 MG PO TABS: 40.0000 mg | ORAL_TABLET | Freq: Every day | ORAL | 1 refills | Status: DC

## 2017-12-21 MED ORDER — GLIPIZIDE 5 MG PO TABS: 5.0000 mg | ORAL_TABLET | Freq: Every day | ORAL | 3 refills | Status: DC

## 2017-12-21 NOTE — Progress Notes (Signed)
Subjective:    Patient ID: Roy Norton, male    DOB: Nov 25, 1960, 57 y.o.   MRN: [id number]  HPI  Roy Norton is a 57 yo male here for lab results.  HNT: BP is elevated at 149/91. Pt reports he has not eaten in awhile. Diabetes: last A1c is 6.4. Pt is checking CBG once a day at 120 or lower. He endorses symptoms of lows once a week.   Patient Active Problem List   Diagnosis Date Noted  . Migraine 12/12/2015  . History of tobacco abuse 04/30/2015  . Hyperlipidemia 04/30/2015  . Essential hypertension 04/30/2015  . Diabetes mellitus without complication (HCC) 04/30/2015  . Obesity 04/30/2015  . Chest pain at rest 04/28/2015  . Dizziness 04/11/2015   Allergies as of 12/21/2017      Reactions   Citalopram Nausea Only   Omeprazole Nausea Only      Medication List        Accurate as of 12/21/17  7:24 PM. Always use your most recent med list.          acetaminophen 500 MG tablet Commonly known as:  TYLENOL Take 500 mg by mouth every 6 (six) hours as needed.   albuterol (2.5 MG/3ML) 0.083% nebulizer solution Commonly known as:  PROVENTIL Take 2.5 mg by nebulization every 6 (six) hours as needed for wheezing or shortness of breath.   amitriptyline 50 MG tablet Commonly known as:  ELAVIL Take 2 tablets (100 mg total) by mouth at bedtime.   amLODipine 5 MG tablet Commonly known as:  NORVASC TAKE ONE TABLET BY MOUTH EVERY DAY   aspirin 81 MG chewable tablet Chew 1 tablet (81 mg total) by mouth daily.   atorvastatin 40 MG tablet Commonly known as:  LIPITOR Take 1 tablet (40 mg total) by mouth daily.   gabapentin 300 MG capsule Commonly known as:  NEURONTIN Take 1 capsule (300 mg total) by mouth 2 (two) times daily.   glipiZIDE 10 MG tablet Commonly known as:  GLUCOTROL Take 1 tablet (10 mg total) by mouth daily before breakfast.   glipiZIDE 5 MG tablet Commonly known as:  GLUCOTROL Take 1 tablet (5 mg total) by mouth daily before breakfast.   lisinopril  20 MG tablet Commonly known as:  PRINIVIL,ZESTRIL TAKE ONE TABLET BY MOUTH EVERY DAY   meloxicam 15 MG tablet Commonly known as:  MOBIC Take 1 tablet (15 mg total) by mouth daily.   metFORMIN 500 MG tablet Commonly known as:  GLUCOPHAGE Take 1 tablet (500 mg total) by mouth 2 (two) times daily with a meal.   metoprolol tartrate 25 MG tablet Commonly known as:  LOPRESSOR Take 1 tablet (25 mg total) by mouth 2 (two) times daily.   omeprazole 20 MG capsule Commonly known as:  PRILOSEC Take 1 capsule (20 mg total) by mouth daily.        Review of Systems A1c is 6.4 and all other labs negative.     Objective:   Physical Exam  Constitutional: He is oriented to person, place, and time. He appears well-developed and well-nourished.  Cardiovascular: Normal rate, regular rhythm, normal heart sounds and intact distal pulses.  Pulmonary/Chest: Effort normal and breath sounds normal.  Abdominal: Soft. Bowel sounds are normal.  Musculoskeletal: He exhibits no edema.  Neurological: He is alert and oriented to person, place, and time.  Vitals reviewed.   BP (!) 146/91 (BP Location: Left Arm)   Pulse 70   Temp 98.2 F (36.8 C)  Wt 222 lb (100.7 kg)   BMI 31.85 kg/m        Assessment & Plan:   HTN: Elevated tonight.  Continue current therapy BP check in 4 weeks.  Diabetes: Well controlled. Decrease Metformin  to once a day. Keep Glipizide at .   Check CBG periodically.   HLD:  Controlled.  Continue current regimen.  Encourage low cholesterol, low fat diet and exercise.    F/u in 4 weeks for BP check and med assessment.

## 2018-01-20 ENCOUNTER — Ambulatory Visit: Payer: Self-pay | Admitting: Adult Health Nurse Practitioner

## 2018-01-20 VITALS — BP 123/91 | HR 99 | Temp 98.3°F | Ht 69.0 in | Wt 224.4 lb

## 2018-01-20 DIAGNOSIS — E119 Type 2 diabetes mellitus without complications: Secondary | ICD-10-CM

## 2018-01-20 DIAGNOSIS — I10 Essential (primary) hypertension: Secondary | ICD-10-CM

## 2018-01-20 LAB — GLUCOSE, POCT (MANUAL RESULT ENTRY): POC GLUCOSE: 104 mg/dL — AB (ref 70–99)

## 2018-01-20 NOTE — Progress Notes (Signed)
Patient: Roy Norton Male    DOB: [birth year]   57 y.o.   MRN: [id number] Visit Date: 01/20/2018  Today's Provider: Jacelyn Pi, NP   Chief Complaint  Patient presents with  . Follow-up    painful lesion on top of head for past 2 years, dry mouth   Subjective:    HPI   Pt states he has a circular lesion on his head that is painful- the lesion has been there for 10 years. No drainage. Does get erythematous and tender to touch. Denies HA.  Denies trauma or bites.   No hair growth.   Has been to derm in the past to no avail- also had a biopsy that he states didn't show any results.    Also here for BP check- last visit BP was 146/91.      Allergies  Allergen Reactions  . Citalopram Nausea Only  . Omeprazole Nausea Only   Previous Medications   ACETAMINOPHEN (TYLENOL) 500 MG TABLET    Take 500 mg by mouth every 6 (six) hours as needed.   ALBUTEROL (PROVENTIL) (2.5 MG/3ML) 0.083% NEBULIZER SOLUTION    Take 2.5 mg by nebulization every 6 (six) hours as needed for wheezing or shortness of breath.   AMITRIPTYLINE (ELAVIL) 50 MG TABLET    Take 2 tablets (100 mg total) by mouth at bedtime.   AMLODIPINE (NORVASC) 5 MG TABLET    Take 1 tablet (5 mg total) by mouth daily.   ASPIRIN 81 MG CHEWABLE TABLET    Chew 1 tablet (81 mg total) by mouth daily.   ATORVASTATIN (LIPITOR) 40 MG TABLET    Take 1 tablet (40 mg total) by mouth daily.   GABAPENTIN (NEURONTIN) 300 MG CAPSULE    Take 1 capsule (300 mg total) by mouth 2 (two) times daily.   GLIPIZIDE (GLUCOTROL) 5 MG TABLET    Take 1 tablet (5 mg total) by mouth daily before breakfast.   LISINOPRIL (PRINIVIL,ZESTRIL) 20 MG TABLET    Take 1 tablet (20 mg total) by mouth daily.   MELOXICAM (MOBIC) 15 MG TABLET    Take 1 tablet (15 mg total) by mouth daily.   METFORMIN (GLUCOPHAGE) 500 MG TABLET    Take 1 tablet (500 mg total) by mouth daily with breakfast.   METOPROLOL TARTRATE (LOPRESSOR) 25 MG TABLET    Take 1 tablet (25 mg total)  by mouth 2 (two) times daily.   OMEPRAZOLE (PRILOSEC) 20 MG CAPSULE    Take 1 capsule (20 mg total) by mouth daily.    Review of Systems  All other systems reviewed and are negative.   Social History   Tobacco Use  . Smoking status: Former Smoker    Years: 10.00    Types: Cigarettes    Last attempt to quit: 12/18/2015    Years since quitting: 2.0  . Smokeless tobacco: Never Used  Substance Use Topics  . Alcohol use: No    Alcohol/week: 0.0 oz   Objective:   BP (!) 123/91 (BP Location: Right Arm, Patient Position: Sitting, Cuff Size: Normal)   Pulse 99   Temp 98.3 F (36.8 C)   Ht  (1.753 m)   Wt 224 lb 6.4 oz (101.8 kg)   BMI 33.14 kg/m   Physical Exam  Constitutional: He appears well-developed and well-nourished.  HENT:  Scalp-nickel sized area with no hair, crusted over, no redness or drainage. Tender to touch.   Cardiovascular: Normal rate, regular rhythm and normal heart sounds.  Pulmonary/Chest: Effort normal and breath sounds normal.        Assessment & Plan:         HTN:  Continue current therapy.   Scalp lesion:  FU with Dr. Candelaria Stagers for additional recommendations.  Declines Dermatology at this time.  Supportive care.   Jacelyn Pi, NP   [facility]

## 2018-02-09 ENCOUNTER — Encounter: Payer: Self-pay | Admitting: Internal Medicine

## 2018-02-09 ENCOUNTER — Other Ambulatory Visit: Payer: Self-pay

## 2018-02-09 ENCOUNTER — Ambulatory Visit: Payer: Self-pay | Admitting: Internal Medicine

## 2018-02-09 VITALS — BP 110/76 | HR 72 | Temp 98.1°F | Ht 69.0 in | Wt 229.5 lb

## 2018-02-09 DIAGNOSIS — L989 Disorder of the skin and subcutaneous tissue, unspecified: Secondary | ICD-10-CM

## 2018-02-09 MED ORDER — BACITRACIN-NEOMYCIN-POLYMYXIN OINTMENT TUBE: 1.0000 "application " | TOPICAL_OINTMENT | Freq: Two times a day (BID) | CUTANEOUS | 1 refills | Status: DC

## 2018-02-09 NOTE — Progress Notes (Signed)
Subjective:    Patient ID: Roy Norton, male    DOB: [birth year], 57 y.o.   MRN: [id number]  HPI  Lesion on top of scalp for 2.5 years; pt reports the area being sore to touch.    Pt reports that the area will heal then come back.   Reports that one year ago he went somewhere in [place] (pt's mom identified the location as [facility])  where they froze the area and cut it. Records for this cannot be found in Epic at this time.   Pt complains of light-headedness and dizziness upon standing.   Pt reports periodic, consistent headaches.     Review of Systems   Patient Active Problem List   Diagnosis Date Noted  . Migraine 12/12/2015  . History of tobacco abuse 04/30/2015  . Hyperlipidemia 04/30/2015  . Essential hypertension 04/30/2015  . Diabetes mellitus without complication (HCC) 04/30/2015  . Obesity 04/30/2015  . Chest pain at rest 04/28/2015  . Dizziness 04/11/2015   Allergies as of 02/09/2018      Reactions   Citalopram Nausea Only   Omeprazole Nausea Only      Medication List        Accurate as of 02/09/18  9:39 AM. Always use your most recent med list.          acetaminophen 500 MG tablet Commonly known as:  TYLENOL Take 500 mg by mouth every 6 (six) hours as needed.   albuterol (2.5 MG/3ML) 0.083% nebulizer solution Commonly known as:  PROVENTIL Take 2.5 mg by nebulization every 6 (six) hours as needed for wheezing or shortness of breath.   amitriptyline 50 MG tablet Commonly known as:  ELAVIL Take 2 tablets (100 mg total) by mouth at bedtime.   amLODipine 5 MG tablet Commonly known as:  NORVASC Take 1 tablet (5 mg total) by mouth daily.   aspirin 81 MG chewable tablet Chew 1 tablet (81 mg total) by mouth daily.   atorvastatin 40 MG tablet Commonly known as:  LIPITOR Take 1 tablet (40 mg total) by mouth daily.   gabapentin 300 MG capsule Commonly known as:  NEURONTIN Take 1 capsule (300 mg total) by mouth 2 (two) times  daily.   glipiZIDE 5 MG tablet Commonly known as:  GLUCOTROL Take 1 tablet (5 mg total) by mouth daily before breakfast.   lisinopril 20 MG tablet Commonly known as:  PRINIVIL,ZESTRIL Take 1 tablet (20 mg total) by mouth daily.   meloxicam 15 MG tablet Commonly known as:  MOBIC Take 1 tablet (15 mg total) by mouth daily.   metFORMIN 500 MG tablet Commonly known as:  GLUCOPHAGE Take 1 tablet (500 mg total) by mouth daily with breakfast.   metoprolol tartrate 25 MG tablet Commonly known as:  LOPRESSOR Take 1 tablet (25 mg total) by mouth 2 (two) times daily.   omeprazole 20 MG capsule Commonly known as:  PRILOSEC Take 1 capsule (20 mg total) by mouth daily.           Objective:   Physical Exam  Constitutional: He is oriented to person, place, and time.  Cardiovascular: Normal rate, regular rhythm and normal heart sounds.  Pulmonary/Chest: Effort normal and breath sounds normal.  Neurological: He is alert and oriented to person, place, and time.   BP 110/76 (BP Location: Left Arm, Patient Position: Sitting)   Pulse 72   Temp 98.1 F (36.7 C) (Oral)   Ht 5\' 9"  (1.753 m)   Wt 229 lb  8 oz (104.1 kg)   BMI 33.89 kg/m   Pt BP in exam room while sitting, 10:00AM (118/78)   Pt BP in exam room while standing, 10:05AM (118/80)   Lesion: Area of erythema, dime sized, in the center of that area there appears to be a healing lesion .25in by .25in  with small scab in the center; has some suggestion of healing from the sides; no open lesion seen.      Assessment & Plan:   Pt does not want to see a dermatologist at this time as he has seen one previously for the same lesion.   Pt will complete medical release to get medical records from [facility].  F/U in 2 weeks with me.   Pt advised to use Neomycin/Poly/Bacitracin 15 gm ointment twice daily on lesion.

## 2018-02-16 ENCOUNTER — Other Ambulatory Visit: Payer: Self-pay

## 2018-02-23 ENCOUNTER — Encounter: Payer: Self-pay | Admitting: Internal Medicine

## 2018-02-23 ENCOUNTER — Ambulatory Visit: Payer: Self-pay | Admitting: Internal Medicine

## 2018-02-23 VITALS — BP 117/75 | HR 78 | Temp 97.8°F | Ht 75.0 in | Wt 229.6 lb

## 2018-02-23 DIAGNOSIS — E119 Type 2 diabetes mellitus without complications: Secondary | ICD-10-CM

## 2018-02-23 NOTE — Progress Notes (Signed)
Subjective:    Patient ID: Roy Norton, male    DOB: Oct 28, 1960, 57 y.o.   MRN: [id number]  HPI  Pt presents for FU on scalp lesion    Review of Systems   Patient Active Problem List   Diagnosis Date Noted  . Migraine 12/12/2015  . History of tobacco abuse 04/30/2015  . Hyperlipidemia 04/30/2015  . Essential hypertension 04/30/2015  . Diabetes mellitus without complication ([place]) 25/42/7062  . Obesity 04/30/2015  . Chest pain at rest 04/28/2015  . Dizziness 04/11/2015   Allergies as of 02/23/2018      Reactions   Citalopram Nausea Only   Omeprazole Nausea Only      Medication List        Accurate as of 02/23/18 10:40 AM. Always use your most recent med list.          acetaminophen 500 MG tablet Commonly known as:  TYLENOL Take 500 mg by mouth every 6 (six) hours as needed.   albuterol (2.5 MG/3ML) 0.083% nebulizer solution Commonly known as:  PROVENTIL Take 2.5 mg by nebulization every 6 (six) hours as needed for wheezing or shortness of breath.   amitriptyline 50 MG tablet Commonly known as:  ELAVIL Take 2 tablets (100 mg total) by mouth at bedtime.   amLODipine 5 MG tablet Commonly known as:  NORVASC Take 1 tablet (5 mg total) by mouth daily.   aspirin 81 MG chewable tablet Chew 1 tablet (81 mg total) by mouth daily.   atorvastatin 40 MG tablet Commonly known as:  LIPITOR Take 1 tablet (40 mg total) by mouth daily.   gabapentin 300 MG capsule Commonly known as:  NEURONTIN Take 1 capsule (300 mg total) by mouth 2 (two) times daily.   glipiZIDE 5 MG tablet Commonly known as:  GLUCOTROL Take 1 tablet (5 mg total) by mouth daily before breakfast.   lisinopril 20 MG tablet Commonly known as:  PRINIVIL,ZESTRIL Take 1 tablet (20 mg total) by mouth daily.   meloxicam 15 MG tablet Commonly known as:  MOBIC Take 1 tablet (15 mg total) by mouth daily.   metFORMIN 500 MG tablet Commonly known as:  GLUCOPHAGE Take 1 tablet (500 mg total) by mouth  daily with breakfast.   metoprolol tartrate 25 MG tablet Commonly known as:  LOPRESSOR Take 1 tablet (25 mg total) by mouth 2 (two) times daily.   neomycin-bacitracin-polymyxin Oint Commonly known as:  NEOSPORIN Apply 1 application topically 2 (two) times daily.   omeprazole 20 MG capsule Commonly known as:  PRILOSEC Take 1 capsule (20 mg total) by mouth daily.           Objective:   Physical Exam  Constitutional: He is oriented to person, place, and time.  Cardiovascular: Normal rate, regular rhythm and normal heart sounds.  Pulmonary/Chest: Effort normal and breath sounds normal.  Neurological: He is alert and oriented to person, place, and time.   Scalp lesion is completely healed and no hair growth seen in the scar tissue.   BP 117/75 (BP Location: Left Arm, Patient Position: Sitting)   Pulse 78   Temp 97.8 F (36.6 C) (Oral)   Ht 6' 3" (1.905 m)   Wt 229 lb 9.6 oz (104.1 kg)   BMI 28.70 kg/m         Assessment & Plan:   Seen in Dermatopathology report received on (02/16/18) Previous visit with [facility] completed skin biopsy on (07/08/2012) and reported that the scalp lesion was a  non cancerous chronic skin condition.   Continue using neosporin ointment daily in the morning to alleviate itching of the lesion on the scalp; pt advised not to scratch the area   FU in 3 months with labs (Met C, CBC, A1C, PSA)  a week prior

## 2018-03-01 ENCOUNTER — Telehealth: Payer: Self-pay | Admitting: Pharmacist

## 2018-03-01 NOTE — Telephone Encounter (Signed)
03/01/2018 8:46:59 AM - Ventolin HFA refill  03/01/18 Placed refill online for Ventolin HFA with [facility], to release 03/14/18, order# [id number].Forde [email or web address]

## 2018-03-03 ENCOUNTER — Ambulatory Visit: Payer: Self-pay

## 2018-04-08 ENCOUNTER — Telehealth: Payer: Self-pay | Admitting: Pharmacist

## 2018-04-08 NOTE — Telephone Encounter (Signed)
04/08/2018 11:42:28 AM - Ventolin [facility] renewal  04/08/18 Faxed [facility] renewal application with script for Ventolin HFA 90 mcg Inhale 2 puffs every four hours as needed replaces Albuterol.Forde RadonAJ

## 2018-04-22 ENCOUNTER — Other Ambulatory Visit: Payer: Self-pay | Admitting: Adult Health Nurse Practitioner

## 2018-05-17 ENCOUNTER — Telehealth: Payer: Self-pay | Admitting: Adult Health Nurse Practitioner

## 2018-05-17 NOTE — Telephone Encounter (Signed)
Patient's mom called to reschedule appointments. Called back and rescheduled with mom, she will call if they don't end up working.

## 2018-05-18 ENCOUNTER — Other Ambulatory Visit: Payer: Self-pay

## 2018-05-25 ENCOUNTER — Ambulatory Visit: Payer: Self-pay | Admitting: Internal Medicine

## 2018-06-01 ENCOUNTER — Other Ambulatory Visit: Payer: Self-pay

## 2018-06-08 ENCOUNTER — Ambulatory Visit: Payer: Self-pay | Admitting: Internal Medicine

## 2018-06-09 ENCOUNTER — Other Ambulatory Visit: Payer: Self-pay

## 2018-06-09 DIAGNOSIS — E119 Type 2 diabetes mellitus without complications: Secondary | ICD-10-CM

## 2018-06-10 LAB — COMPREHENSIVE METABOLIC PANEL
ALBUMIN: 4.3 g/dL (ref 3.5–5.5)
ALT: 32 IU/L (ref 0–44)
AST: 21 IU/L (ref 0–40)
Albumin/Globulin Ratio: 1.5 (ref 1.2–2.2)
Alkaline Phosphatase: 82 IU/L (ref 39–117)
BILIRUBIN TOTAL: 0.2 mg/dL (ref 0.0–1.2)
BUN / CREAT RATIO: 12 (ref 9–20)
BUN: 11 mg/dL (ref 6–24)
CHLORIDE: 104 mmol/L (ref 96–106)
CO2: 23 mmol/L (ref 20–29)
CREATININE: 0.89 mg/dL (ref 0.76–1.27)
Calcium: 9.4 mg/dL (ref 8.7–10.2)
GFR calc non Af Amer: 96 mL/min/{1.73_m2} (ref >59)
GFR, EST AFRICAN AMERICAN: 110 mL/min/{1.73_m2} (ref >59)
GLUCOSE: 70 mg/dL (ref 65–99)
Globulin, Total: 2.9 g/dL (ref 1.5–4.5)
Potassium: 4.2 mmol/L (ref 3.5–5.2)
Sodium: 143 mmol/L (ref 134–144)
Total Protein: 7.2 g/dL (ref 6.0–8.5)

## 2018-06-10 LAB — CBC
HEMATOCRIT: 41.8 % (ref 37.5–51.0)
Hemoglobin: 14.4 g/dL (ref 13.0–17.7)
MCH: 31.4 pg (ref 26.6–33.0)
MCHC: 34.4 g/dL (ref 31.5–35.7)
MCV: 91 fL (ref 79–97)
Platelets: 195 10*3/uL (ref 150–450)
RBC: 4.58 x10E6/uL (ref 4.14–5.80)
RDW: 13.4 % (ref 12.3–15.4)
WBC: 6.8 10*3/uL (ref 3.4–10.8)

## 2018-06-10 LAB — HEMOGLOBIN A1C
Est. average glucose Bld gHb Est-mCnc: 137 mg/dL
Hgb A1c MFr Bld: 6.4 % — ABNORMAL HIGH (ref 4.8–5.6)

## 2018-06-10 LAB — PSA: Prostate Specific Ag, Serum: 0.7 ng/mL (ref 0.0–4.0)

## 2018-06-15 ENCOUNTER — Encounter: Payer: Self-pay | Admitting: Internal Medicine

## 2018-06-15 ENCOUNTER — Ambulatory Visit: Payer: Self-pay | Admitting: Internal Medicine

## 2018-06-15 ENCOUNTER — Other Ambulatory Visit: Payer: Self-pay | Admitting: Adult Health Nurse Practitioner

## 2018-06-15 VITALS — BP 119/76 | HR 68 | Temp 98.1°F | Ht 72.0 in | Wt 228.6 lb

## 2018-06-15 DIAGNOSIS — E785 Hyperlipidemia, unspecified: Secondary | ICD-10-CM

## 2018-06-15 DIAGNOSIS — E119 Type 2 diabetes mellitus without complications: Secondary | ICD-10-CM

## 2018-06-15 DIAGNOSIS — I10 Essential (primary) hypertension: Secondary | ICD-10-CM

## 2018-06-15 DIAGNOSIS — E1169 Type 2 diabetes mellitus with other specified complication: Secondary | ICD-10-CM

## 2018-06-15 DIAGNOSIS — K219 Gastro-esophageal reflux disease without esophagitis: Secondary | ICD-10-CM

## 2018-06-15 MED ORDER — GLIPIZIDE 5 MG PO TABS: 5.0000 mg | ORAL_TABLET | Freq: Every day | ORAL | 3 refills | Status: DC

## 2018-06-15 MED ORDER — METFORMIN HCL 500 MG PO TABS: 500.0000 mg | ORAL_TABLET | Freq: Two times a day (BID) | ORAL | 3 refills | Status: DC

## 2018-06-15 MED ORDER — ATORVASTATIN CALCIUM 40 MG PO TABS: 40.0000 mg | ORAL_TABLET | Freq: Every day | ORAL | 3 refills | Status: DC

## 2018-06-15 MED ORDER — LISINOPRIL 20 MG PO TABS: 20.0000 mg | ORAL_TABLET | Freq: Every day | ORAL | 3 refills | Status: DC

## 2018-06-15 MED ORDER — PANTOPRAZOLE SODIUM 20 MG PO TBEC: 20.0000 mg | DELAYED_RELEASE_TABLET | Freq: Every day | ORAL | 3 refills | Status: DC

## 2018-06-15 MED ORDER — GABAPENTIN 300 MG PO CAPS: 300.0000 mg | ORAL_CAPSULE | Freq: Two times a day (BID) | ORAL | 3 refills | Status: DC

## 2018-06-15 MED ORDER — AMLODIPINE BESYLATE 5 MG PO TABS: 5.0000 mg | ORAL_TABLET | Freq: Every day | ORAL | 3 refills | Status: DC

## 2018-06-15 NOTE — Progress Notes (Signed)
Subjective:    Patient ID: Roy Norton, male    DOB: [birth year], 57 y.o.   MRN: [id number]  HPI  Chief Complaint  Patient presents with  . Follow-up    No new medical concerns.   . Medication Refill    All meds need refill.     Review of Systems  Patient Active Problem List   Diagnosis Date Noted  . Migraine 12/12/2015  . History of tobacco abuse 04/30/2015  . Hyperlipidemia 04/30/2015  . Essential hypertension 04/30/2015  . Diabetes mellitus without complication ([place]) 24/04/7352  . Obesity 04/30/2015  . Chest pain at rest 04/28/2015  . Dizziness 04/11/2015   Allergies as of 06/15/2018   No Known Allergies     Medication List        Accurate as of 06/15/18 10:58 AM. Always use your most recent med list.          acetaminophen 500 MG tablet Commonly known as:  TYLENOL Take 500 mg by mouth every 6 (six) hours as needed.   albuterol (2.5 MG/3ML) 0.083% nebulizer solution Commonly known as:  PROVENTIL Take 2.5 mg by nebulization every 6 (six) hours as needed for wheezing or shortness of breath.   amitriptyline 50 MG tablet Commonly known as:  ELAVIL Take 2 tablets (100 mg total) by mouth at bedtime.   amLODipine 5 MG tablet Commonly known as:  NORVASC TAKE ONE TABLET BY MOUTH EVERY DAY   aspirin 81 MG chewable tablet Chew 1 tablet (81 mg total) by mouth daily.   atorvastatin 40 MG tablet Commonly known as:  LIPITOR Take 1 tablet (40 mg total) by mouth daily.   gabapentin 300 MG capsule Commonly known as:  NEURONTIN TAKE ONE CAPSULE BY MOUTH 2 TIMES A DAY   glipiZIDE 5 MG tablet Commonly known as:  GLUCOTROL Take 1 tablet (5 mg total) by mouth daily before breakfast.   lisinopril 20 MG tablet Commonly known as:  PRINIVIL,ZESTRIL Take 1 tablet (20 mg total) by mouth daily.   meloxicam 15 MG tablet Commonly known as:  MOBIC Take 1 tablet (15 mg total) by mouth daily.   metFORMIN 500 MG tablet Commonly known as:  GLUCOPHAGE Take 1 tablet  (500 mg total) by mouth daily with breakfast.   metFORMIN 500 MG tablet Commonly known as:  GLUCOPHAGE TAKE ONE TABLET BY MOUTH 2 TIMES A DAY WITH A MEAL   metoprolol tartrate 25 MG tablet Commonly known as:  LOPRESSOR Take 1 tablet (25 mg total) by mouth 2 (two) times daily.   neomycin-bacitracin-polymyxin Oint Commonly known as:  NEOSPORIN Apply 1 application topically 2 (two) times daily.   omeprazole 20 MG capsule Commonly known as:  PRILOSEC Take 1 capsule (20 mg total) by mouth daily.          Objective:   Physical Exam  BP 119/76   Pulse 68   Temp 98.1 F (36.7 C) (Oral)   Ht 6' (1.829 m)   Wt 228 lb 9.6 oz (103.7 kg)   BMI 31.00 kg/m   Skin lesion has healed; Area about a quarter inch by quarter inch circle near front of head on scalp. Hair growth visible over lesion.      Assessment & Plan:   1. Essential hypertension BP is controlled.   Ordered Today:  - amLODipine (NORVASC) 5 MG tablet; Take 1 tablet (5 mg total) by mouth daily.  Dispense: 90 tablet; Refill: 3 - lisinopril (PRINIVIL,ZESTRIL) 20 MG tablet; Take 1 tablet (20  mg total) by mouth daily.  Dispense: 90 tablet; Refill: 3  2. Diabetes mellitus without complication ([place]) Controlled. Last A1C was 6.4 (06/09/18)  Ordered Today:  - metFORMIN (GLUCOPHAGE) 500 MG tablet; Take 1 tablet (500 mg total) by mouth 2 (two) times daily with a meal.  Dispense: 180 tablet; Refill: 3 - glipiZIDE (GLUCOTROL) 5 MG tablet; Take 1 tablet (5 mg total) by mouth daily before breakfast.  Dispense: 90 tablet; Refill: 3 - gabapentin (NEURONTIN) 300 MG capsule; Take 1 capsule (300 mg total) by mouth 2 (two) times daily.  Dispense: 180 capsule; Refill: 3  3. Hyperlipidemia associated with type 2 diabetes mellitus ([place]) Ordered Today:  - atorvastatin (LIPITOR) 40 MG tablet; Take 1 tablet (40 mg total) by mouth daily.  Dispense: 90 tablet; Refill: 3  4. Gastroesophageal reflux disease, esophagitis presence not  specified D/C Prilosec 31m and start Protonix.   Ordered Today:  - pantoprazole (PROTONIX) 20 MG tablet; Take 1 tablet (20 mg total) by mouth daily.  Dispense: 90 tablet; Refill: 3   Follow up in 6 months with labs a week prior (CBC, Met C, Lipid, A1C)

## 2018-08-04 ENCOUNTER — Other Ambulatory Visit: Payer: Self-pay

## 2018-08-04 ENCOUNTER — Encounter: Payer: Self-pay | Admitting: Emergency Medicine

## 2018-08-04 ENCOUNTER — Emergency Department: Payer: Self-pay

## 2018-08-04 DIAGNOSIS — Z5321 Procedure and treatment not carried out due to patient leaving prior to being seen by health care provider: Secondary | ICD-10-CM | POA: Insufficient documentation

## 2018-08-04 DIAGNOSIS — R079 Chest pain, unspecified: Secondary | ICD-10-CM | POA: Insufficient documentation

## 2018-08-04 NOTE — ED Triage Notes (Addendum)
Pt to triage via w/c, brought in by EMS with no distress noted; st substernal CP 7/10 x hr; NTG x 1, 4 ASA 81mg  admin PTA of EMS; prod cough yellow sputum since yesterday; pt denies hx of this CP

## 2018-08-05 ENCOUNTER — Emergency Department
Admission: EM | Admit: 2018-08-05 | Discharge: 2018-08-05 | Disposition: A | Discharge status: Home / Self Care | Payer: Self-pay | Attending: Emergency Medicine | Admitting: Emergency Medicine

## 2018-08-05 LAB — TROPONIN I: Troponin I: <0.03 ng/mL (ref <0.03)

## 2018-08-05 LAB — CBC WITH DIFFERENTIAL/PLATELET
Abs Immature Granulocytes: 0.02 10*3/uL (ref 0.00–0.07)
Basophils Absolute: 0 10*3/uL (ref 0.0–0.1)
Basophils Relative: 0 %
Eosinophils Absolute: 0.1 10*3/uL (ref 0.0–0.5)
Eosinophils Relative: 1 %
HCT: 42.2 % (ref 39.0–52.0)
Hemoglobin: 14 g/dL (ref 13.0–17.0)
Immature Granulocytes: 0 %
Lymphocytes Relative: 50 %
Lymphs Abs: 3.8 10*3/uL (ref 0.7–4.0)
MCH: 30.6 pg (ref 26.0–34.0)
MCHC: 33.2 g/dL (ref 30.0–36.0)
MCV: 92.3 fL (ref 80.0–100.0)
Monocytes Absolute: 0.5 10*3/uL (ref 0.1–1.0)
Monocytes Relative: 7 %
Neutro Abs: 3.3 10*3/uL (ref 1.7–7.7)
Neutrophils Relative %: 42 %
PLATELETS: 214 10*3/uL (ref 150–400)
RBC: 4.57 MIL/uL (ref 4.22–5.81)
RDW: 13.7 % (ref 11.5–15.5)
WBC: 7.8 10*3/uL (ref 4.0–10.5)
nRBC: 0 % (ref 0.0–0.2)

## 2018-08-05 LAB — COMPREHENSIVE METABOLIC PANEL
ALBUMIN: 4.2 g/dL (ref 3.5–5.0)
ALT: 29 U/L (ref 0–44)
AST: 33 U/L (ref 15–41)
Alkaline Phosphatase: 81 U/L (ref 38–126)
Anion gap: 9 (ref 5–15)
BUN: 17 mg/dL (ref 6–20)
CO2: 20 mmol/L — ABNORMAL LOW (ref 22–32)
Calcium: 8.9 mg/dL (ref 8.9–10.3)
Chloride: 111 mmol/L (ref 98–111)
Creatinine, Ser: 0.98 mg/dL (ref 0.61–1.24)
GFR calc Af Amer: >60 mL/min (ref >60)
GFR calc non Af Amer: >60 mL/min (ref >60)
Glucose, Bld: 121 mg/dL — ABNORMAL HIGH (ref 70–99)
Potassium: 3.6 mmol/L (ref 3.5–5.1)
Sodium: 140 mmol/L (ref 135–145)
Total Bilirubin: 0.6 mg/dL (ref 0.3–1.2)
Total Protein: 7.5 g/dL (ref 6.5–8.1)

## 2018-08-05 IMAGING — CR DG CHEST 2V
1 series · 2 of 2 positions shown · non-contrast
Comparison: None.

CLINICAL DATA: Left chest wall pain beginning at [DATE] a.m. today.

EXAM:
CHEST  2 VIEW

[Series 1: dg chest 2 view · 0.14mm/px · 2 of 2 slices shown]
[im 1/2]
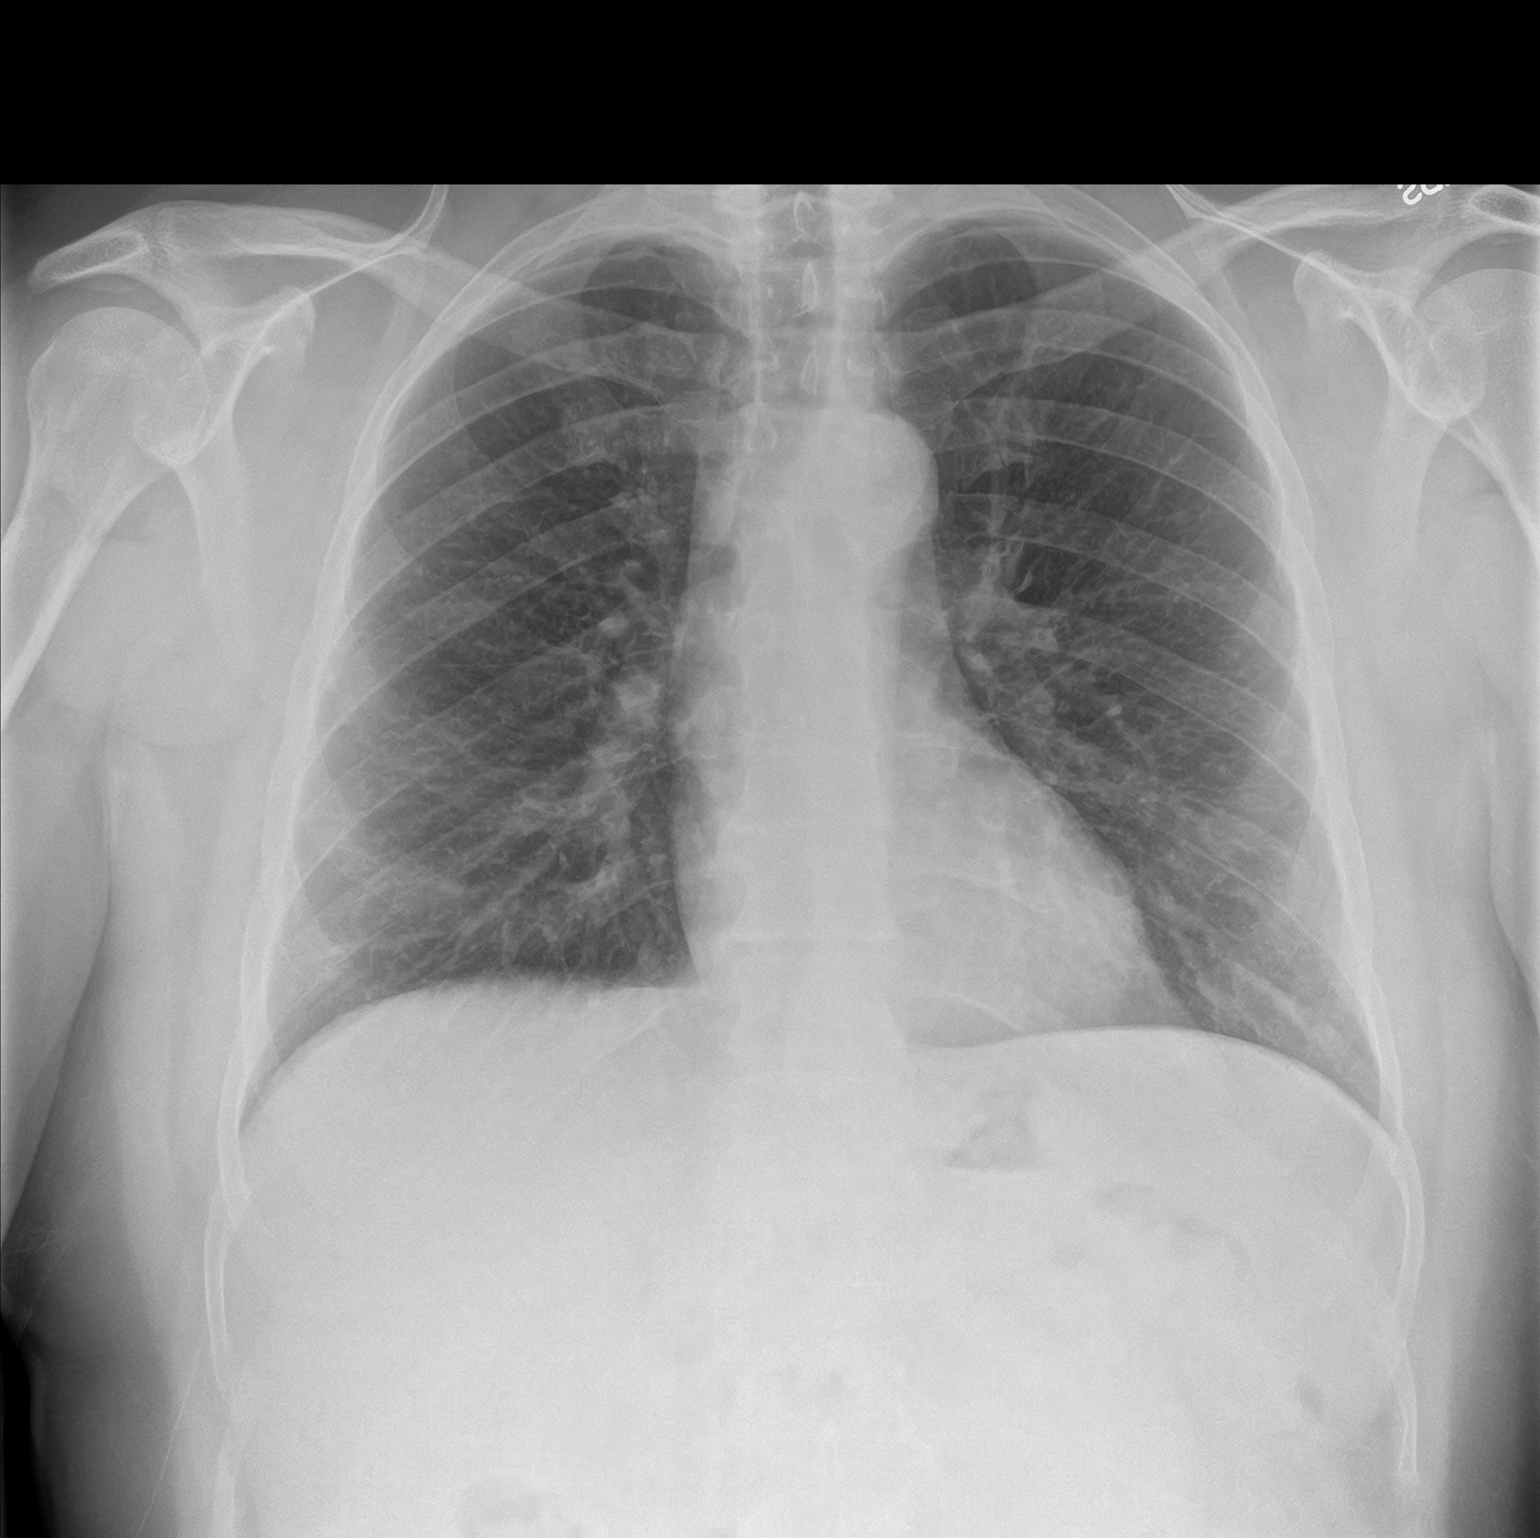
[im 2/2]
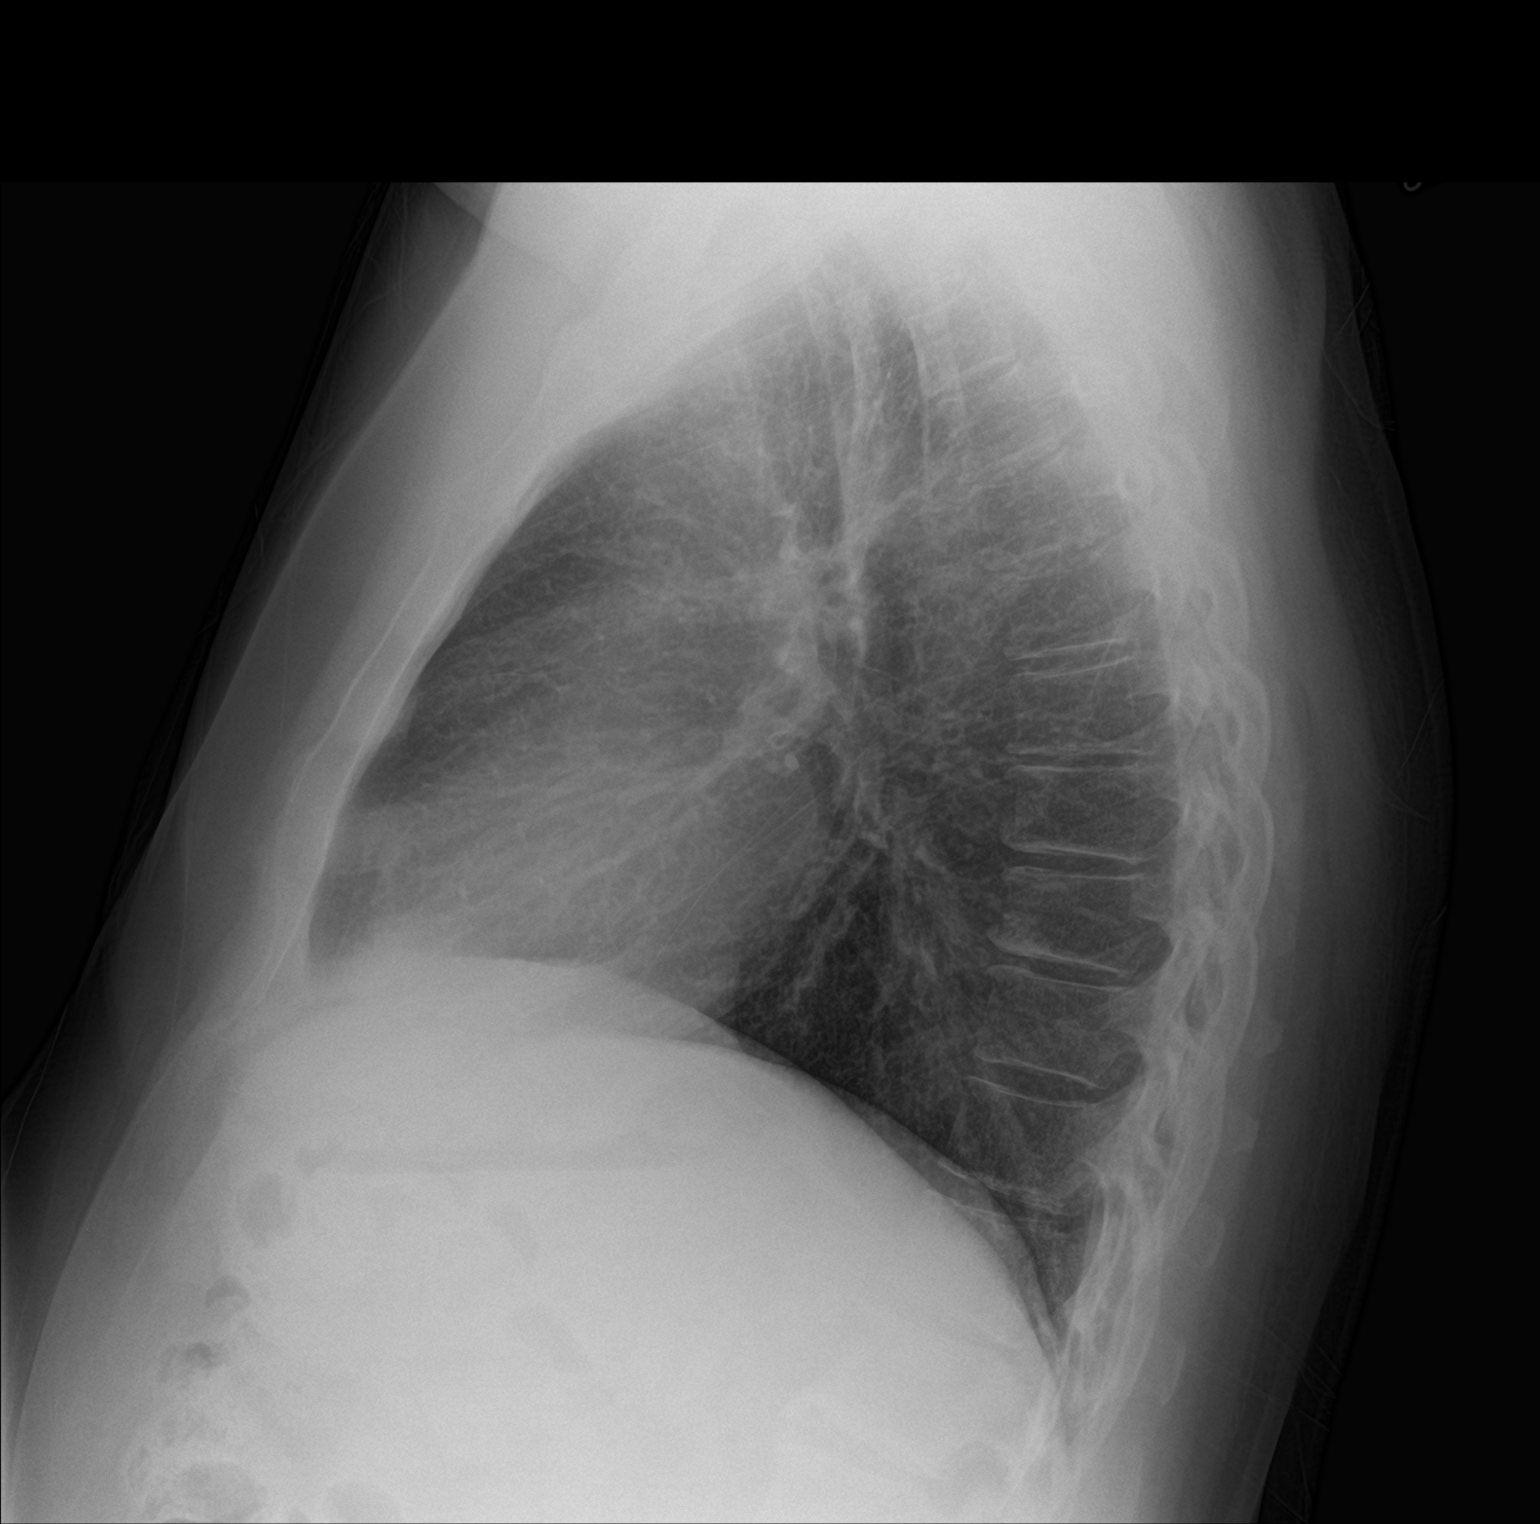

[2 of 2 positions shown; findings below may reference images not displayed]

FINDINGS: The lungs are clear. Heart size is normal. No pneumothorax or
pleural effusion. Aortic atherosclerosis noted. No acute bony
abnormality.
IMPRESSION: No acute disease.

Atherosclerosis.

## 2018-08-05 NOTE — ED Notes (Signed)
No answer when called several times from lobby 

## 2018-08-05 NOTE — ED Notes (Signed)
No answer when called for reassessment and repeat troponin

## 2018-08-08 ENCOUNTER — Telehealth: Payer: Self-pay | Admitting: Emergency Medicine

## 2018-08-08 NOTE — Telephone Encounter (Signed)
Called patient due to lwot to inquire about condition and follow up plans.  Says he does not have any pain now. But he still gets them now and then.  I explained that it could still be his heart and that he really needs to see a doctor.  [facility]  He agrees to call them and have his lab/xray reviewed and let them know about his chest pains.

## 2018-08-10 ENCOUNTER — Ambulatory Visit: Payer: Self-pay | Admitting: Internal Medicine

## 2018-08-10 ENCOUNTER — Encounter: Payer: Self-pay | Admitting: Internal Medicine

## 2018-08-10 VITALS — BP 119/81 | HR 81 | Temp 98.2°F | Wt 217.1 lb

## 2018-08-10 DIAGNOSIS — R072 Precordial pain: Secondary | ICD-10-CM

## 2018-08-10 DIAGNOSIS — Z09 Encounter for follow-up examination after completed treatment for conditions other than malignant neoplasm: Secondary | ICD-10-CM

## 2018-08-10 DIAGNOSIS — E119 Type 2 diabetes mellitus without complications: Secondary | ICD-10-CM

## 2018-08-10 NOTE — Progress Notes (Signed)
Follow up on test done in hospital last Thursday wants results

## 2018-08-10 NOTE — Progress Notes (Signed)
Subjective:    Patient ID: Roy Norton, male    DOB: 24-Feb-1961, 57 y.o.   MRN: [id number]  HPI   Pt is a 57 year old male who presents for ED follow up for chest pain. Pt described the chest pain as it felt like someone was "jabbing him with a knife." The pain hit him all at once while he was sitting in his chair. He was nauseated and had cold sweats. Pt has not had any more chest pain since the incident (08/05/18). Pt does not report a family hx of heart disease. Pt had E[facility] in [facility].    Chief Complaint  Patient presents with  . Diabetes  . Hypertension    Review of Systems Patient Active Problem List   Diagnosis Date Noted  . Migraine 12/12/2015  . History of tobacco abuse 04/30/2015  . Hyperlipidemia 04/30/2015  . Essential hypertension 04/30/2015  . Diabetes mellitus without complication (HCC) 04/30/2015  . Obesity 04/30/2015  . Chest pain at rest 04/28/2015  . Dizziness 04/11/2015   Allergies as of 08/10/2018   No Known Allergies     Medication List       Accurate as of August 10, 2018  9:16 AM. Always use your most recent med list.        acetaminophen 500 MG tablet Commonly known as:  TYLENOL Take 500 mg by mouth every 6 (six) hours as needed.   albuterol (2.5 MG/3ML) 0.083% nebulizer solution Commonly known as:  PROVENTIL Take 2.5 mg by nebulization every 6 (six) hours as needed for wheezing or shortness of breath.   amitriptyline 50 MG tablet Commonly known as:  ELAVIL Take 2 tablets (100 mg total) by mouth at bedtime.   amLODipine 5 MG tablet Commonly known as:  NORVASC TAKE ONE TABLET BY MOUTH EVERY DAY   aspirin 81 MG chewable tablet Chew 1 tablet (81 mg total) by mouth daily.   atorvastatin 40 MG tablet Commonly known as:  LIPITOR Take 1 tablet (40 mg total) by mouth daily.   gabapentin 300 MG capsule Commonly known as:  NEURONTIN Take 1 capsule (300 mg total) by mouth 2 (two) times daily.   glipiZIDE 5 MG tablet Commonly  known as:  GLUCOTROL Take 1 tablet (5 mg total) by mouth daily before breakfast.   lisinopril 20 MG tablet Commonly known as:  PRINIVIL,ZESTRIL Take 1 tablet (20 mg total) by mouth daily.   meloxicam 15 MG tablet Commonly known as:  MOBIC Take 1 tablet (15 mg total) by mouth daily.   metFORMIN 500 MG tablet Commonly known as:  GLUCOPHAGE Take 1 tablet (500 mg total) by mouth 2 (two) times daily with a meal.   metoprolol tartrate 25 MG tablet Commonly known as:  LOPRESSOR Take 1 tablet (25 mg total) by mouth 2 (two) times daily.   neomycin-bacitracin-polymyxin Oint Commonly known as:  NEOSPORIN Apply 1 application topically 2 (two) times daily.   pantoprazole 20 MG tablet Commonly known as:  PROTONIX Take 1 tablet (20 mg total) by mouth daily.          Objective:   Physical Exam Constitutional:      Appearance: Normal appearance.  Cardiovascular:     Rate and Rhythm: Normal rate and regular rhythm.  Pulmonary:     Effort: Pulmonary effort is normal.     Breath sounds: Normal breath sounds.  Neurological:     Mental Status: He is alert and oriented to person, place, and time.  Psychiatric:        Behavior: Behavior normal.     BP 119/81   Pulse 81   Temp 98.2 F (36.8 C)   Wt 217 lb 1.6 oz (98.5 kg)   BMI 29.44 kg/m        Assessment & Plan:   1. Hospital discharge follow-up Pt is following up after Emergency Department visit on 08/05/18 for chest pain. Cardiac enzyme marker was negative, chest x-ray was normal, and EKG was normal upon arrival at ED. Pt does not report anymore chest pain since ED visit. Previous ECHO stress test was normal 3 years ago and previous treadmill stress test was normal one year ago.   2. Precordial pain Ordering Myocardial Perfusion Test today due to chest pain with cardiac etiology suspected.      3. Diabetes mellitus without complication (HCC) Controlled.   FU after Myocardial Perfusion Test completed.    Follow up  after

## 2018-11-10 ENCOUNTER — Telehealth: Payer: Self-pay | Admitting: Pharmacy Technician

## 2018-11-10 NOTE — Telephone Encounter (Signed)
Received 2020 proof of income.  Patient eligible to receive medication assistance at [facility] as long as eligibility requirements continue to be met.  [facility]

## 2018-11-16 ENCOUNTER — Ambulatory Visit: Payer: Self-pay | Admitting: Internal Medicine

## 2018-11-16 ENCOUNTER — Other Ambulatory Visit: Payer: Self-pay

## 2018-11-16 VITALS — BP 130/86 | HR 81 | Temp 98.0°F | Wt 217.4 lb

## 2018-11-16 DIAGNOSIS — M79674 Pain in right toe(s): Secondary | ICD-10-CM

## 2018-11-16 DIAGNOSIS — E119 Type 2 diabetes mellitus without complications: Secondary | ICD-10-CM

## 2018-11-16 DIAGNOSIS — R079 Chest pain, unspecified: Secondary | ICD-10-CM

## 2018-11-16 DIAGNOSIS — I10 Essential (primary) hypertension: Secondary | ICD-10-CM

## 2018-11-17 NOTE — Progress Notes (Signed)
 Subjective:    Patient ID: Roy Norton, male    DOB: 02/09/1961, 57 y.o.   MRN: [id number]  HPI   Patient is a 57 year old male who presents for acute R great toe. Patient claims hit the toe on a coffee table and then again on a chair. Patient checked his FSBS yesterday and it was 120. Patient discussed in detail about stress being caused by step father who has Alzheimer's disease. Patient and his mom are the caretakers of the stepfather who lives in their place of residence. Patient states that he stays up at night taking care of his stepfather. Patient has been unable to call and schedule heart imaging at [facility] for chest pain.   Chief Complaint  Patient presents with  . Toe Pain    R big toe    Review of Systems   Patient Active Problem List   Diagnosis Date Noted  . Migraine 12/12/2015  . History of tobacco abuse 04/30/2015  . Hyperlipidemia 04/30/2015  . Essential hypertension 04/30/2015  . Diabetes mellitus without complication (HCC) 04/30/2015  . Obesity 04/30/2015  . Chest pain at rest 04/28/2015  . Dizziness 04/11/2015   Allergies as of 11/16/2018   No Known Allergies     Medication List       Accurate as of November 16, 2018 11:59 PM. Always use your most recent med list.        acetaminophen 500 MG tablet Commonly known as:  TYLENOL Take 500 mg by mouth every 6 (six) hours as needed.   albuterol (2.5 MG/3ML) 0.083% nebulizer solution Commonly known as:  PROVENTIL Take 2.5 mg by nebulization every 6 (six) hours as needed for wheezing or shortness of breath.   amitriptyline 50 MG tablet Commonly known as:  ELAVIL Take 2 tablets (100 mg total) by mouth at bedtime.   amLODipine 5 MG tablet Commonly known as:  NORVASC TAKE ONE TABLET BY MOUTH EVERY DAY   aspirin 81 MG chewable tablet Chew 1 tablet (81 mg total) by mouth daily.   atorvastatin 40 MG tablet Commonly known as:  LIPITOR Take 1 tablet (40 mg total) by mouth daily.   gabapentin 300 MG  capsule Commonly known as:  NEURONTIN Take 1 capsule (300 mg total) by mouth 2 (two) times daily.   glipiZIDE 5 MG tablet Commonly known as:  GLUCOTROL Take 1 tablet (5 mg total) by mouth daily before breakfast.   lisinopril 20 MG tablet Commonly known as:  PRINIVIL,ZESTRIL Take 1 tablet (20 mg total) by mouth daily.   meloxicam 15 MG tablet Commonly known as:  Mobic Take 1 tablet (15 mg total) by mouth daily.   metFORMIN 500 MG tablet Commonly known as:  GLUCOPHAGE Take 1 tablet (500 mg total) by mouth 2 (two) times daily with a meal.   metoprolol tartrate 25 MG tablet Commonly known as:  LOPRESSOR Take 1 tablet (25 mg total) by mouth 2 (two) times daily.   neomycin-bacitracin-polymyxin Oint Commonly known as:  NEOSPORIN Apply 1 application topically 2 (two) times daily.   pantoprazole 20 MG tablet Commonly known as:  Protonix Take 1 tablet (20 mg total) by mouth daily.         Objective:   Physical Exam Constitutional:      Appearance: Normal appearance.  Cardiovascular:     Rate and Rhythm: Normal rate and regular rhythm.     Heart sounds: Normal heart sounds.  Pulmonary:     Effort: Pulmonary effort is   normal.     Breath sounds: Normal breath sounds.  Neurological:     Mental Status: He is alert.    R great toe- no hx of previous problems, hx of recent trauma, 1st PIP joint is slightly red- not swollen-not hot, no sign of blood clots     Assessment & Plan:   1. Great toe pain, right After exam, anticipate it will heal. No X-ray needed at this time. Patient already has rx for Meloxicam.   2. Chest pain at rest Patient advised to call and schedule myocardial perfusion. Order is already placed from last visit (Order # [id number])  3. Diabetes mellitus without complication (HCC) Managed. Continue current medications.   4. Essential hypertension Controlled. Continue current medications.    RTE in April for previously scheduled FU with labs (Met C, CBC,  Lipid panel, A1C).   

## 2018-12-14 ENCOUNTER — Other Ambulatory Visit: Payer: Self-pay

## 2018-12-21 ENCOUNTER — Ambulatory Visit: Payer: Self-pay | Admitting: Internal Medicine

## 2018-12-21 DIAGNOSIS — E119 Type 2 diabetes mellitus without complications: Secondary | ICD-10-CM

## 2018-12-21 NOTE — Progress Notes (Signed)
  Subjective:    Patient ID: Roy Norton, male    DOB: February 09, 1961, 58 y.o.   MRN: [id number]  HPI   Patient is a 58 year old male who presents for telephonic follow up. Patient does not report any new symptoms or concerns. Patient does not need any medication refills at this time.   Review of Systems   Patient Active Problem List   Diagnosis Date Noted  . Migraine 12/12/2015  . History of tobacco abuse 04/30/2015  . Hyperlipidemia 04/30/2015  . Essential hypertension 04/30/2015  . Diabetes mellitus without complication ([place]) 87/68/1157  . Obesity 04/30/2015  . Chest pain at rest 04/28/2015  . Dizziness 04/11/2015   Allergies as of 12/21/2018   No Known Allergies     Medication List       Accurate as of December 21, 2018 11:10 AM. Always use your most recent med list.        acetaminophen 500 MG tablet Commonly known as:  TYLENOL Take 500 mg by mouth every 6 (six) hours as needed.   albuterol (2.5 MG/3ML) 0.083% nebulizer solution Commonly known as:  PROVENTIL Take 2.5 mg by nebulization every 6 (six) hours as needed for wheezing or shortness of breath.   amitriptyline 50 MG tablet Commonly known as:  ELAVIL Take 2 tablets (100 mg total) by mouth at bedtime.   amLODipine 5 MG tablet Commonly known as:  NORVASC TAKE ONE TABLET BY MOUTH EVERY DAY   aspirin 81 MG chewable tablet Chew 1 tablet (81 mg total) by mouth daily.   atorvastatin 40 MG tablet Commonly known as:  LIPITOR Take 1 tablet (40 mg total) by mouth daily.   gabapentin 300 MG capsule Commonly known as:  NEURONTIN Take 1 capsule (300 mg total) by mouth 2 (two) times daily.   glipiZIDE 5 MG tablet Commonly known as:  GLUCOTROL Take 1 tablet (5 mg total) by mouth daily before breakfast.   lisinopril 20 MG tablet Commonly known as:  ZESTRIL Take 1 tablet (20 mg total) by mouth daily.   meloxicam 15 MG tablet Commonly known as:  Mobic Take 1 tablet (15 mg total) by mouth daily.   metFORMIN  500 MG tablet Commonly known as:  GLUCOPHAGE Take 1 tablet (500 mg total) by mouth 2 (two) times daily with a meal.   metoprolol tartrate 25 MG tablet Commonly known as:  LOPRESSOR Take 1 tablet (25 mg total) by mouth 2 (two) times daily.   neomycin-bacitracin-polymyxin Oint Commonly known as:  NEOSPORIN Apply 1 application topically 2 (two) times daily.   pantoprazole 20 MG tablet Commonly known as:  Protonix Take 1 tablet (20 mg total) by mouth daily.           Objective:   Physical Exam   Telephonic Visit     Assessment & Plan:   1. Diabetes mellitus without complication [facility]) Patient reports FSBS readings of 118-119 and denies any readings over 300. No new symptoms or concerns stated.   Return in 1 month for follow up with labs a week prior (Met C, CBC, Lipid, A1c)

## 2019-01-11 ENCOUNTER — Other Ambulatory Visit: Payer: Self-pay

## 2019-01-11 DIAGNOSIS — E119 Type 2 diabetes mellitus without complications: Secondary | ICD-10-CM

## 2019-01-11 DIAGNOSIS — I10 Essential (primary) hypertension: Secondary | ICD-10-CM

## 2019-01-11 DIAGNOSIS — E785 Hyperlipidemia, unspecified: Secondary | ICD-10-CM

## 2019-01-11 DIAGNOSIS — E1169 Type 2 diabetes mellitus with other specified complication: Secondary | ICD-10-CM

## 2019-01-12 LAB — LIPID PANEL
Chol/HDL Ratio: 3.2 ratio (ref 0.0–5.0)
Cholesterol, Total: 133 mg/dL (ref 100–199)
HDL: 41 mg/dL (ref >39)
LDL Calculated: 74 mg/dL (ref 0–99)
Triglycerides: 89 mg/dL (ref 0–149)
VLDL Cholesterol Cal: 18 mg/dL (ref 5–40)

## 2019-01-12 LAB — COMPREHENSIVE METABOLIC PANEL
ALT: 28 IU/L (ref 0–44)
AST: 18 IU/L (ref 0–40)
Albumin/Globulin Ratio: 1.9 (ref 1.2–2.2)
Albumin: 4.3 g/dL (ref 3.8–4.9)
Alkaline Phosphatase: 77 IU/L (ref 39–117)
BUN/Creatinine Ratio: 14 (ref 9–20)
BUN: 12 mg/dL (ref 6–24)
Bilirubin Total: 0.3 mg/dL (ref 0.0–1.2)
CO2: 24 mmol/L (ref 20–29)
Calcium: 9.2 mg/dL (ref 8.7–10.2)
Chloride: 105 mmol/L (ref 96–106)
Creatinine, Ser: 0.85 mg/dL (ref 0.76–1.27)
GFR calc Af Amer: 112 mL/min/{1.73_m2} (ref >59)
GFR calc non Af Amer: 97 mL/min/{1.73_m2} (ref >59)
Globulin, Total: 2.3 g/dL (ref 1.5–4.5)
Glucose: 118 mg/dL — ABNORMAL HIGH (ref 65–99)
Potassium: 4.4 mmol/L (ref 3.5–5.2)
Sodium: 141 mmol/L (ref 134–144)
Total Protein: 6.6 g/dL (ref 6.0–8.5)

## 2019-01-12 LAB — CBC
Hematocrit: 42.4 % (ref 37.5–51.0)
Hemoglobin: 14.4 g/dL (ref 13.0–17.7)
MCH: 31.1 pg (ref 26.6–33.0)
MCHC: 34 g/dL (ref 31.5–35.7)
MCV: 92 fL (ref 79–97)
Platelets: 167 10*3/uL (ref 150–450)
RBC: 4.63 x10E6/uL (ref 4.14–5.80)
RDW: 13 % (ref 11.6–15.4)
WBC: 5.5 10*3/uL (ref 3.4–10.8)

## 2019-01-12 LAB — HEMOGLOBIN A1C
Est. average glucose Bld gHb Est-mCnc: 131 mg/dL
Hgb A1c MFr Bld: 6.2 % — ABNORMAL HIGH (ref 4.8–5.6)

## 2019-01-18 ENCOUNTER — Ambulatory Visit: Payer: Self-pay | Admitting: Internal Medicine

## 2019-01-18 ENCOUNTER — Other Ambulatory Visit: Payer: Self-pay

## 2019-01-18 DIAGNOSIS — Z0000 Encounter for general adult medical examination without abnormal findings: Secondary | ICD-10-CM

## 2019-01-18 NOTE — Progress Notes (Signed)
PHONE VISIT  Subjective:    Patient ID: Roy Norton, male    DOB: February 23, 1961, 58 y.o.   MRN: [id number]   Pt here for a phone visit for f/u. Labs look good. Pt complains of passing out twice on Saturday about 12 and 1 pm. Pt was out for 10 mins both times. Pt got dizzy and sweaty and woke up with a headache. Pt's blood sugars have been running good. Highest is 150 and the lowest would be 70's. Blackouts only occur during the day.   HPI Patient Active Problem List   Diagnosis Date Noted  . Migraine 12/12/2015  . History of tobacco abuse 04/30/2015  . Hyperlipidemia 04/30/2015  . Essential hypertension 04/30/2015  . Diabetes mellitus without complication (HCC) 04/30/2015  . Obesity 04/30/2015  . Chest pain at rest 04/28/2015  . Dizziness 04/11/2015   Allergies as of 01/18/2019   No Known Allergies     Medication List       Accurate as of Jan 18, 2019  9:34 AM. If you have any questions, ask your nurse or doctor.        acetaminophen 500 MG tablet Commonly known as:  TYLENOL Take 500 mg by mouth every 6 (six) hours as needed.   albuterol (2.5 MG/3ML) 0.083% nebulizer solution Commonly known as:  PROVENTIL Take 2.5 mg by nebulization every 6 (six) hours as needed for wheezing or shortness of breath.   amitriptyline 50 MG tablet Commonly known as:  ELAVIL Take 2 tablets (100 mg total) by mouth at bedtime.   amLODipine 5 MG tablet Commonly known as:  NORVASC TAKE ONE TABLET BY MOUTH EVERY DAY   aspirin 81 MG chewable tablet Chew 1 tablet (81 mg total) by mouth daily.   atorvastatin 40 MG tablet Commonly known as:  LIPITOR Take 1 tablet (40 mg total) by mouth daily.   gabapentin 300 MG capsule Commonly known as:  NEURONTIN Take 1 capsule (300 mg total) by mouth 2 (two) times daily.   glipiZIDE 5 MG tablet Commonly known as:  GLUCOTROL Take 1 tablet (5 mg total) by mouth daily before breakfast.   lisinopril 20 MG tablet Commonly known as:  ZESTRIL Take 1  tablet (20 mg total) by mouth daily.   meloxicam 15 MG tablet Commonly known as:  Mobic Take 1 tablet (15 mg total) by mouth daily.   metFORMIN 500 MG tablet Commonly known as:  GLUCOPHAGE Take 1 tablet (500 mg total) by mouth 2 (two) times daily with a meal.   metoprolol tartrate 25 MG tablet Commonly known as:  LOPRESSOR Take 1 tablet (25 mg total) by mouth 2 (two) times daily.   neomycin-bacitracin-polymyxin Oint Commonly known as:  NEOSPORIN Apply 1 application topically 2 (two) times daily.   pantoprazole 20 MG tablet Commonly known as:  Protonix Take 1 tablet (20 mg total) by mouth daily.         Review of Systems     Objective:   Physical Exam        Assessment & Plan:  Plan: Pt will stop glipizide. Next time pt passes out, Pt needs to go to the ED immediately after passing out to run some tests. Pt needs to keep a record of his sugars before breakfast and before dinner. Will have an appt on wed at 9:30 to discuss the log. Lab appt scheduled for 6 months.

## 2019-01-25 ENCOUNTER — Other Ambulatory Visit: Payer: Self-pay

## 2019-01-25 ENCOUNTER — Ambulatory Visit: Payer: Self-pay | Admitting: Internal Medicine

## 2019-01-25 DIAGNOSIS — E119 Type 2 diabetes mellitus without complications: Secondary | ICD-10-CM

## 2019-01-25 NOTE — Progress Notes (Signed)
   Subjective:    Patient ID: Roy Norton, male    DOB: 1960-11-19, 58 y.o.   MRN: [id number]  HPI Pt states he has not had any more black out spells. Pt kept a log of blood sugars. Sugars have been reading 107, 120, and 110 checked on Monday and Tuesday before breakfast. No new problems.   Patient Active Problem List   Diagnosis Date Noted  . Migraine 12/12/2015  . History of tobacco abuse 04/30/2015  . Hyperlipidemia 04/30/2015  . Essential hypertension 04/30/2015  . Diabetes mellitus without complication (HCC) 04/30/2015  . Obesity 04/30/2015  . Chest pain at rest 04/28/2015  . Dizziness 04/11/2015    Allergies as of 01/25/2019   No Known Allergies     Medication List       Accurate as of January 25, 2019  9:52 AM. If you have any questions, ask your nurse or doctor.        STOP taking these medications   neomycin-bacitracin-polymyxin Oint Commonly known as:  NEOSPORIN Stopped by:  Virl Axe, MD     TAKE these medications   acetaminophen 500 MG tablet Commonly known as:  TYLENOL Take 500 mg by mouth every 6 (six) hours as needed.   albuterol (2.5 MG/3ML) 0.083% nebulizer solution Commonly known as:  PROVENTIL Take 2.5 mg by nebulization every 6 (six) hours as needed for wheezing or shortness of breath.   amitriptyline 50 MG tablet Commonly known as:  ELAVIL Take 2 tablets (100 mg total) by mouth at bedtime.   amLODipine 5 MG tablet Commonly known as:  NORVASC TAKE ONE TABLET BY MOUTH EVERY DAY   aspirin 81 MG chewable tablet Chew 1 tablet (81 mg total) by mouth daily.   atorvastatin 40 MG tablet Commonly known as:  LIPITOR Take 1 tablet (40 mg total) by mouth daily.   gabapentin 300 MG capsule Commonly known as:  NEURONTIN Take 1 capsule (300 mg total) by mouth 2 (two) times daily.   glipiZIDE 5 MG tablet Commonly known as:  GLUCOTROL Take 1 tablet (5 mg total) by mouth daily before breakfast.   lisinopril 20 MG tablet Commonly known as:   ZESTRIL Take 1 tablet (20 mg total) by mouth daily.   meloxicam 15 MG tablet Commonly known as:  Mobic Take 1 tablet (15 mg total) by mouth daily.   metFORMIN 500 MG tablet Commonly known as:  GLUCOPHAGE Take 1 tablet (500 mg total) by mouth 2 (two) times daily with a meal.   metoprolol tartrate 25 MG tablet Commonly known as:  LOPRESSOR Take 1 tablet (25 mg total) by mouth 2 (two) times daily.   pantoprazole 20 MG tablet Commonly known as:  Protonix Take 1 tablet (20 mg total) by mouth daily.         Review of Systems deferred    Objective:   Physical Exam deferred       Assessment & Plan:  Pt needs to check sugars before breakfast and dinner. Pt needs to go to the ED if he has anymore black out spells. No refiils needed. F/u call in 3 wks.

## 2019-02-15 ENCOUNTER — Ambulatory Visit: Payer: Self-pay | Admitting: Internal Medicine

## 2019-02-22 ENCOUNTER — Ambulatory Visit: Payer: Self-pay | Admitting: Internal Medicine

## 2019-03-01 ENCOUNTER — Ambulatory Visit: Payer: Self-pay | Admitting: Internal Medicine

## 2019-03-01 ENCOUNTER — Other Ambulatory Visit: Payer: Self-pay

## 2019-03-01 DIAGNOSIS — E119 Type 2 diabetes mellitus without complications: Secondary | ICD-10-CM

## 2019-03-01 NOTE — Progress Notes (Signed)
   Subjective:    Patient ID: Roy Norton, male    DOB: 1960-11-10, 58 y.o.   MRN: [id number]  HPI  Pt is a 58 year old male presenting for a telephonic visit.   Review of Systems Patient Active Problem List   Diagnosis Date Noted  . Migraine 12/12/2015  . History of tobacco abuse 04/30/2015  . Hyperlipidemia 04/30/2015  . Essential hypertension 04/30/2015  . Diabetes mellitus without complication ([place]) 09/38/1829  . Obesity 04/30/2015  . Chest pain at rest 04/28/2015  . Dizziness 04/11/2015   Allergies as of 03/01/2019   No Known Allergies     Medication List       Accurate as of March 01, 2019 11:32 AM. If you have any questions, ask your nurse or doctor.        acetaminophen 500 MG tablet Commonly known as: TYLENOL Take 500 mg by mouth every 6 (six) hours as needed.   albuterol (2.5 MG/3ML) 0.083% nebulizer solution Commonly known as: PROVENTIL Take 2.5 mg by nebulization every 6 (six) hours as needed for wheezing or shortness of breath.   amitriptyline 50 MG tablet Commonly known as: ELAVIL Take 2 tablets (100 mg total) by mouth at bedtime.   amLODipine 5 MG tablet Commonly known as: NORVASC TAKE ONE TABLET BY MOUTH EVERY DAY   aspirin 81 MG chewable tablet Chew 1 tablet (81 mg total) by mouth daily.   atorvastatin 40 MG tablet Commonly known as: LIPITOR Take 1 tablet (40 mg total) by mouth daily.   gabapentin 300 MG capsule Commonly known as: NEURONTIN Take 1 capsule (300 mg total) by mouth 2 (two) times daily.   glipiZIDE 5 MG tablet Commonly known as: GLUCOTROL Take 1 tablet (5 mg total) by mouth daily before breakfast.   lisinopril 20 MG tablet Commonly known as: ZESTRIL Take 1 tablet (20 mg total) by mouth daily.   meloxicam 15 MG tablet Commonly known as: Mobic Take 1 tablet (15 mg total) by mouth daily.   metFORMIN 500 MG tablet Commonly known as: GLUCOPHAGE Take 1 tablet (500 mg total) by mouth 2 (two) times daily with a meal.    metoprolol tartrate 25 MG tablet Commonly known as: LOPRESSOR Take 1 tablet (25 mg total) by mouth 2 (two) times daily.   pantoprazole 20 MG tablet Commonly known as: Protonix Take 1 tablet (20 mg total) by mouth daily.          Objective:   Physical Exam   None - telephonic visit     Assessment & Plan:   1. Diabetes.  Pt reports no further syncope spells since we stopped the glipizide. Random sugar readings include 195, 98, and 75 without any symptoms. Plan is to further reduce medication to only metformin 500mg  at supper. Pt is to stay off the glipizide and continue self monitoring sugars. FU with me in 2 weeks.

## 2019-03-15 ENCOUNTER — Ambulatory Visit: Payer: Self-pay | Admitting: Internal Medicine

## 2019-03-15 ENCOUNTER — Other Ambulatory Visit: Payer: Self-pay

## 2019-03-15 DIAGNOSIS — E119 Type 2 diabetes mellitus without complications: Secondary | ICD-10-CM

## 2019-03-15 NOTE — Progress Notes (Signed)
   Subjective:    Patient ID: Roy Norton, male    DOB: 03-29-1961, 58 y.o.   MRN: [id number]  HPI  Pt is a 58 year old male presenting for a telephonic visit.   Review of Systems Patient Active Problem List   Diagnosis Date Noted  . Migraine 12/12/2015  . History of tobacco abuse 04/30/2015  . Hyperlipidemia 04/30/2015  . Essential hypertension 04/30/2015  . Diabetes mellitus without complication ([place]) 44/81/8563  . Obesity 04/30/2015  . Chest pain at rest 04/28/2015  . Dizziness 04/11/2015   Allergies as of 03/15/2019   No Known Allergies     Medication List       Accurate as of March 15, 2019 10:28 AM. If you have any questions, ask your nurse or doctor.        acetaminophen 500 MG tablet Commonly known as: TYLENOL Take 500 mg by mouth every 6 (six) hours as needed.   albuterol (2.5 MG/3ML) 0.083% nebulizer solution Commonly known as: PROVENTIL Take 2.5 mg by nebulization every 6 (six) hours as needed for wheezing or shortness of breath.   amitriptyline 50 MG tablet Commonly known as: ELAVIL Take 2 tablets (100 mg total) by mouth at bedtime.   amLODipine 5 MG tablet Commonly known as: NORVASC TAKE ONE TABLET BY MOUTH EVERY DAY   aspirin 81 MG chewable tablet Chew 1 tablet (81 mg total) by mouth daily.   atorvastatin 40 MG tablet Commonly known as: LIPITOR Take 1 tablet (40 mg total) by mouth daily.   gabapentin 300 MG capsule Commonly known as: NEURONTIN Take 1 capsule (300 mg total) by mouth 2 (two) times daily.   glipiZIDE 5 MG tablet Commonly known as: GLUCOTROL Take 1 tablet (5 mg total) by mouth daily before breakfast.   lisinopril 20 MG tablet Commonly known as: ZESTRIL Take 1 tablet (20 mg total) by mouth daily.   meloxicam 15 MG tablet Commonly known as: Mobic Take 1 tablet (15 mg total) by mouth daily.   metFORMIN 500 MG tablet Commonly known as: GLUCOPHAGE Take 1 tablet (500 mg total) by mouth 2 (two) times daily with a meal.    metoprolol tartrate 25 MG tablet Commonly known as: LOPRESSOR Take 1 tablet (25 mg total) by mouth 2 (two) times daily.   pantoprazole 20 MG tablet Commonly known as: Protonix Take 1 tablet (20 mg total) by mouth daily.          Objective:   Physical Exam   No PE - telephonic visit     Assessment & Plan:   1.Diabetes  Pt reports no additional black out spells. Pt states blood sugar highs in the120s when he occasionally checks them. No further medicine modifications. If pt has any add black out spells he needs to go to ER for emergency evaluation. Plan to check stability in 2 months. Labs that were previously scheduled are to be done on his FU on 9/23.

## 2019-04-14 ENCOUNTER — Other Ambulatory Visit: Payer: Self-pay | Admitting: Adult Health Nurse Practitioner

## 2019-04-14 ENCOUNTER — Telehealth: Payer: Self-pay | Admitting: Pharmacist

## 2019-04-14 ENCOUNTER — Other Ambulatory Visit: Payer: Self-pay | Admitting: Gerontology

## 2019-04-14 DIAGNOSIS — I10 Essential (primary) hypertension: Secondary | ICD-10-CM

## 2019-04-14 NOTE — Telephone Encounter (Signed)
04/14/2019 10:05:04 AM - Ventolin HFA/GSK renewal to dr  04/14/2019 Patient in office today, he had paperwork he received from [place] for Renewal on his Ventolin, got patient to sign his portion today, printed script--will send to [facility] for Dr. Mable Fill to sign for Renewal.AJ

## 2019-05-09 ENCOUNTER — Telehealth: Payer: Self-pay | Admitting: Pharmacist

## 2019-05-09 NOTE — Telephone Encounter (Signed)
05/09/2019 10:05:17 AM - Ventolin/GSK renewal  05/09/2019 Faxed [employer] renewal for Ventolin HFA Inhale 2 puffs every four hours as needed (replaces Albuterol).Delos Haring

## 2019-05-17 ENCOUNTER — Other Ambulatory Visit: Payer: Self-pay

## 2019-05-17 DIAGNOSIS — Z0000 Encounter for general adult medical examination without abnormal findings: Secondary | ICD-10-CM

## 2019-05-18 LAB — LIPID PANEL
Chol/HDL Ratio: 3.1 ratio (ref 0.0–5.0)
Cholesterol, Total: 114 mg/dL (ref 100–199)
HDL: 37 mg/dL — ABNORMAL LOW (ref >39)
LDL Chol Calc (NIH): 60 mg/dL (ref 0–99)
Triglycerides: 87 mg/dL (ref 0–149)
VLDL Cholesterol Cal: 17 mg/dL (ref 5–40)

## 2019-05-18 LAB — CBC WITH DIFFERENTIAL
Basophils Absolute: 0 10*3/uL (ref 0.0–0.2)
Basos: 0 %
EOS (ABSOLUTE): 0.1 10*3/uL (ref 0.0–0.4)
Eos: 1 %
Hematocrit: 47.7 % (ref 37.5–51.0)
Hemoglobin: 15.7 g/dL (ref 13.0–17.7)
Immature Grans (Abs): 0 10*3/uL (ref 0.0–0.1)
Immature Granulocytes: 0 %
Lymphocytes Absolute: 3.6 10*3/uL — ABNORMAL HIGH (ref 0.7–3.1)
Lymphs: 39 %
MCH: 30.7 pg (ref 26.6–33.0)
MCHC: 32.9 g/dL (ref 31.5–35.7)
MCV: 93 fL (ref 79–97)
Monocytes Absolute: 0.6 10*3/uL (ref 0.1–0.9)
Monocytes: 6 %
Neutrophils Absolute: 4.9 10*3/uL (ref 1.4–7.0)
Neutrophils: 54 %
RBC: 5.11 x10E6/uL (ref 4.14–5.80)
RDW: 13.2 % (ref 11.6–15.4)
WBC: 9.1 10*3/uL (ref 3.4–10.8)

## 2019-05-18 LAB — COMPREHENSIVE METABOLIC PANEL
ALT: 32 IU/L (ref 0–44)
AST: 24 IU/L (ref 0–40)
Albumin/Globulin Ratio: 1.8 (ref 1.2–2.2)
Albumin: 4.8 g/dL (ref 3.8–4.9)
Alkaline Phosphatase: 97 IU/L (ref 39–117)
BUN/Creatinine Ratio: 17 (ref 9–20)
BUN: 15 mg/dL (ref 6–24)
Bilirubin Total: 0.3 mg/dL (ref 0.0–1.2)
CO2: 22 mmol/L (ref 20–29)
Calcium: 9.9 mg/dL (ref 8.7–10.2)
Chloride: 103 mmol/L (ref 96–106)
Creatinine, Ser: 0.9 mg/dL (ref 0.76–1.27)
GFR calc Af Amer: 109 mL/min/{1.73_m2} (ref >59)
GFR calc non Af Amer: 94 mL/min/{1.73_m2} (ref >59)
Globulin, Total: 2.7 g/dL (ref 1.5–4.5)
Glucose: 124 mg/dL — ABNORMAL HIGH (ref 65–99)
Potassium: 4.6 mmol/L (ref 3.5–5.2)
Sodium: 143 mmol/L (ref 134–144)
Total Protein: 7.5 g/dL (ref 6.0–8.5)

## 2019-05-18 LAB — URINALYSIS, ROUTINE W REFLEX MICROSCOPIC
Bilirubin, UA: NEGATIVE
Glucose, UA: NEGATIVE
Ketones, UA: NEGATIVE
Leukocytes,UA: NEGATIVE
Nitrite, UA: NEGATIVE
Protein,UA: NEGATIVE
RBC, UA: NEGATIVE
Specific Gravity, UA: 1.022 (ref 1.005–1.030)
Urobilinogen, Ur: 0.2 mg/dL (ref 0.2–1.0)
pH, UA: 5 (ref 5.0–7.5)

## 2019-05-18 LAB — PSA: Prostate Specific Ag, Serum: 0.5 ng/mL (ref 0.0–4.0)

## 2019-05-18 LAB — HEMOGLOBIN A1C
Est. average glucose Bld gHb Est-mCnc: 131 mg/dL
Hgb A1c MFr Bld: 6.2 % — ABNORMAL HIGH (ref 4.8–5.6)

## 2019-05-24 ENCOUNTER — Ambulatory Visit: Payer: Self-pay | Admitting: Internal Medicine

## 2019-05-31 ENCOUNTER — Ambulatory Visit: Payer: Self-pay | Admitting: Internal Medicine

## 2019-05-31 ENCOUNTER — Other Ambulatory Visit: Payer: Self-pay

## 2019-05-31 DIAGNOSIS — E119 Type 2 diabetes mellitus without complications: Secondary | ICD-10-CM

## 2019-05-31 MED ORDER — METFORMIN HCL 500 MG PO TABS: 500.0000 mg | ORAL_TABLET | ORAL | 3 refills | Status: DC

## 2019-05-31 NOTE — Progress Notes (Signed)
   Subjective:    Patient ID: Roy Norton, male    DOB: [birth year], 58 y.o.   MRN: [id number]  HPI  Patient is a 58 year old male presenting for a telephonic visit. Pt reports he has not been having anymore black out spells since his metformin was reduced approximately 3 months ago. Pt reports taking metformin '500mg'$  once a day at night. Pt reports having no COVID-19 symptoms.   Review of Systems  Patient Active Problem List   Diagnosis Date Noted  . Migraine 12/12/2015  . History of tobacco abuse 04/30/2015  . Hyperlipidemia 04/30/2015  . Essential hypertension 04/30/2015  . Diabetes mellitus without complication ([place]) 16/38/4665  . Obesity 04/30/2015  . Chest pain at rest 04/28/2015  . Dizziness 04/11/2015   Allergies as of 05/31/2019   No Known Allergies     Medication List       Accurate as of May 31, 2019  9:15 AM. If you have any questions, ask your nurse or doctor.        acetaminophen 500 MG tablet Commonly known as: TYLENOL Take 500 mg by mouth every 6 (six) hours as needed.   albuterol (2.5 MG/3ML) 0.083% nebulizer solution Commonly known as: PROVENTIL Take 2.5 mg by nebulization every 6 (six) hours as needed for wheezing or shortness of breath.   amitriptyline 50 MG tablet Commonly known as: ELAVIL Take 2 tablets (100 mg total) by mouth at bedtime.   amLODipine 5 MG tablet Commonly known as: NORVASC TAKE ONE TABLET BY MOUTH EVERY DAY   aspirin 81 MG chewable tablet Chew 1 tablet (81 mg total) by mouth daily.   atorvastatin 40 MG tablet Commonly known as: LIPITOR Take 1 tablet (40 mg total) by mouth daily.   gabapentin 300 MG capsule Commonly known as: NEURONTIN Take 1 capsule (300 mg total) by mouth 2 (two) times daily.   glipiZIDE 5 MG tablet Commonly known as: GLUCOTROL Take 1 tablet (5 mg total) by mouth daily before breakfast.   lisinopril 20 MG tablet Commonly known as: ZESTRIL Take 1 tablet (20 mg total) by mouth daily.    meloxicam 15 MG tablet Commonly known as: Mobic Take 1 tablet (15 mg total) by mouth daily.   metFORMIN 500 MG tablet Commonly known as: GLUCOPHAGE Take 1 tablet (500 mg total) by mouth 1 day or 1 dose. 1 daily at night   metoprolol tartrate 25 MG tablet Commonly known as: LOPRESSOR TAKE ONE TABLET BY MOUTH 2 TIMES A DAY   pantoprazole 20 MG tablet Commonly known as: Protonix Take 1 tablet (20 mg total) by mouth daily.           Objective:   Physical Exam   No PE - telephonic visit     Assessment & Plan:   1. Diabetes A1c remains stable at 6.1. Pt will continue metformin '500mg'$  at bed time. Reminded pt that if he has any symptoms of black out spell that he needs to go immediately to ER to check for additional causes of his symptoms. Reevaluate in 90 days.    Follow-up appointment scheduled in January with labs one week prior. - A1c - Met C

## 2019-06-26 ENCOUNTER — Other Ambulatory Visit: Payer: Self-pay | Admitting: Gerontology

## 2019-06-26 ENCOUNTER — Other Ambulatory Visit: Payer: Self-pay | Admitting: Internal Medicine

## 2019-06-26 DIAGNOSIS — E119 Type 2 diabetes mellitus without complications: Secondary | ICD-10-CM

## 2019-06-26 DIAGNOSIS — K219 Gastro-esophageal reflux disease without esophagitis: Secondary | ICD-10-CM

## 2019-07-13 ENCOUNTER — Ambulatory Visit: Payer: Self-pay | Admitting: Urology

## 2019-07-13 ENCOUNTER — Other Ambulatory Visit: Payer: Self-pay

## 2019-07-13 VITALS — BP 129/86 | HR 71 | Temp 97.1°F | Ht 70.0 in | Wt 216.5 lb

## 2019-07-13 DIAGNOSIS — M25561 Pain in right knee: Secondary | ICD-10-CM

## 2019-07-13 NOTE — Progress Notes (Signed)
  Patient: Roy Norton Male    DOB: 12-Nov-1960   58 y.o.   MRN: [id number] Visit Date: 07/13/2019  Today's Provider: Zara Council, PA-C   Chief Complaint  Patient presents with  . Joint Swelling    Knee pain, swollen, from walking on concrete floors. Never hurt so much before this week. Has been putting Icy-hot and taking some medicine.    Subjective:    HPI  Right knee pain x one week - sudden onset of pain and swelling - couldn't get out of the car - swelling has abated - still tender - hurts to walk   States it had happened several years ago and was told he had a "burr" in his knee that should be removed arthroscopically but he couldn't afford the surgery    No Known Allergies Previous Medications   ACETAMINOPHEN (TYLENOL) 500 MG TABLET    Take 500 mg by mouth every 6 (six) hours as needed.   ALBUTEROL (PROVENTIL) (2.5 MG/3ML) 0.083% NEBULIZER SOLUTION    Take 2.5 mg by nebulization every 6 (six) hours as needed for wheezing or shortness of breath.   AMITRIPTYLINE (ELAVIL) 50 MG TABLET    Take 2 tablets (100 mg total) by mouth at bedtime.   AMLODIPINE (NORVASC) 5 MG TABLET    TAKE ONE TABLET BY MOUTH EVERY DAY   ASPIRIN 81 MG CHEWABLE TABLET    Chew 1 tablet (81 mg total) by mouth daily.   ATORVASTATIN (LIPITOR) 40 MG TABLET    Take 1 tablet (40 mg total) by mouth daily.   GABAPENTIN (NEURONTIN) 300 MG CAPSULE    TAKE ONE CAPSULE BY MOUTH 2 TIMES A DAY   GLIPIZIDE (GLUCOTROL) 5 MG TABLET    Take 1 tablet (5 mg total) by mouth daily before breakfast.   LISINOPRIL (PRINIVIL,ZESTRIL) 20 MG TABLET    Take 1 tablet (20 mg total) by mouth daily.   MELOXICAM (MOBIC) 15 MG TABLET    Take 1 tablet (15 mg total) by mouth daily.   METFORMIN (GLUCOPHAGE) 500 MG TABLET    Take 1 tablet (500 mg total) by mouth 1 day or 1 dose. 1 daily at night   METOPROLOL TARTRATE (LOPRESSOR) 25 MG TABLET    TAKE ONE TABLET BY MOUTH 2 TIMES A DAY   PANTOPRAZOLE (PROTONIX) 20 MG TABLET    TAKE 1 TABLET BY  MOUTH EVERY DAY    Review of Systems  Social History   Tobacco Use  . Smoking status: Former Smoker    Years: 10.00    Types: Cigarettes    Quit date: 12/18/2015    Years since quitting: 3.5  . Smokeless tobacco: Never Used  Substance Use Topics  . Alcohol use: No    Alcohol/week: 0.0 standard drinks   Objective:   BP 129/86   Pulse 71   Temp (!) 97.1 F (36.2 C)   Ht 5\' 10"  (1.778 m)   Wt 216 lb 8 oz (98.2 kg)   SpO2 96%   BMI 31.06 kg/m   Physical Exam Right knee no erythema, no fluctuance, tender to palpation on the medial condyle, no loss of ROM      Assessment & Plan:     1. ? Bone chip in the knee Need xrays and referral to orthopedics  Check uric acid level   Kiira Brach, PA-C   [facility]

## 2019-07-14 LAB — URIC ACID: Uric Acid: 5 mg/dL (ref 3.7–8.6)

## 2019-07-19 ENCOUNTER — Other Ambulatory Visit: Payer: Self-pay

## 2019-07-19 DIAGNOSIS — E119 Type 2 diabetes mellitus without complications: Secondary | ICD-10-CM

## 2019-07-20 LAB — COMPREHENSIVE METABOLIC PANEL
ALT: 23 IU/L (ref 0–44)
AST: 24 IU/L (ref 0–40)
Albumin/Globulin Ratio: 1.9 (ref 1.2–2.2)
Albumin: 4.9 g/dL (ref 3.8–4.9)
Alkaline Phosphatase: 83 IU/L (ref 39–117)
BUN/Creatinine Ratio: 21 — ABNORMAL HIGH (ref 9–20)
BUN: 20 mg/dL (ref 6–24)
Bilirubin Total: 0.6 mg/dL (ref 0.0–1.2)
CO2: 22 mmol/L (ref 20–29)
Calcium: 9.4 mg/dL (ref 8.7–10.2)
Chloride: 105 mmol/L (ref 96–106)
Creatinine, Ser: 0.94 mg/dL (ref 0.76–1.27)
GFR calc Af Amer: 104 mL/min/{1.73_m2} (ref >59)
GFR calc non Af Amer: 90 mL/min/{1.73_m2} (ref >59)
Globulin, Total: 2.6 g/dL (ref 1.5–4.5)
Glucose: 116 mg/dL — ABNORMAL HIGH (ref 65–99)
Potassium: 4.4 mmol/L (ref 3.5–5.2)
Sodium: 143 mmol/L (ref 134–144)
Total Protein: 7.5 g/dL (ref 6.0–8.5)

## 2019-07-20 LAB — HEMOGLOBIN A1C
Est. average glucose Bld gHb Est-mCnc: 126 mg/dL
Hgb A1c MFr Bld: 6 % — ABNORMAL HIGH (ref 4.8–5.6)

## 2019-07-26 ENCOUNTER — Other Ambulatory Visit: Payer: Self-pay | Admitting: Internal Medicine

## 2019-07-26 DIAGNOSIS — I10 Essential (primary) hypertension: Secondary | ICD-10-CM

## 2019-08-09 ENCOUNTER — Other Ambulatory Visit: Payer: Self-pay | Admitting: Internal Medicine

## 2019-08-09 DIAGNOSIS — E785 Hyperlipidemia, unspecified: Secondary | ICD-10-CM

## 2019-08-09 DIAGNOSIS — E1169 Type 2 diabetes mellitus with other specified complication: Secondary | ICD-10-CM

## 2019-08-29 ENCOUNTER — Ambulatory Visit: Payer: Self-pay | Admitting: Specialist

## 2019-08-30 ENCOUNTER — Other Ambulatory Visit: Payer: Self-pay

## 2019-08-30 DIAGNOSIS — M25561 Pain in right knee: Secondary | ICD-10-CM

## 2019-08-31 LAB — COMPREHENSIVE METABOLIC PANEL
ALT: 32 IU/L (ref 0–44)
AST: 17 IU/L (ref 0–40)
Albumin/Globulin Ratio: 1.7 (ref 1.2–2.2)
Albumin: 4.6 g/dL (ref 3.8–4.9)
Alkaline Phosphatase: 76 IU/L (ref 39–117)
BUN/Creatinine Ratio: 19 (ref 9–20)
BUN: 15 mg/dL (ref 6–24)
Bilirubin Total: 0.4 mg/dL (ref 0.0–1.2)
CO2: 23 mmol/L (ref 20–29)
Calcium: 9.6 mg/dL (ref 8.7–10.2)
Chloride: 104 mmol/L (ref 96–106)
Creatinine, Ser: 0.79 mg/dL (ref 0.76–1.27)
GFR calc Af Amer: 115 mL/min/{1.73_m2} (ref >59)
GFR calc non Af Amer: 100 mL/min/{1.73_m2} (ref >59)
Globulin, Total: 2.7 g/dL (ref 1.5–4.5)
Glucose: 122 mg/dL — ABNORMAL HIGH (ref 65–99)
Potassium: 4.5 mmol/L (ref 3.5–5.2)
Sodium: 141 mmol/L (ref 134–144)
Total Protein: 7.3 g/dL (ref 6.0–8.5)

## 2019-08-31 LAB — HEMOGLOBIN A1C
Est. average glucose Bld gHb Est-mCnc: 128 mg/dL
Hgb A1c MFr Bld: 6.1 % — ABNORMAL HIGH (ref 4.8–5.6)

## 2019-09-06 ENCOUNTER — Ambulatory Visit: Payer: Self-pay | Admitting: Internal Medicine

## 2019-09-13 ENCOUNTER — Encounter: Payer: Self-pay | Admitting: Internal Medicine

## 2019-09-13 ENCOUNTER — Other Ambulatory Visit: Payer: Self-pay

## 2019-09-13 ENCOUNTER — Ambulatory Visit: Payer: Self-pay | Admitting: Internal Medicine

## 2019-09-13 VITALS — BP 118/79 | HR 72 | Temp 97.0°F | Ht 70.0 in | Wt 211.5 lb

## 2019-09-13 DIAGNOSIS — E119 Type 2 diabetes mellitus without complications: Secondary | ICD-10-CM

## 2019-09-13 DIAGNOSIS — I10 Essential (primary) hypertension: Secondary | ICD-10-CM

## 2019-09-13 NOTE — Progress Notes (Signed)
Subjective:    Patient ID: Roy Norton, male    DOB: [birth year], 59 y.o.   MRN: [id number]  HPI  Patient is a 59 year old male presenting for a telephonic follow-up of diabetes. Pt states that he is well. He reports he has been having stomach cramps for 4 days over the weekend, but no vomiting or diarrhea. It has since cleared. He did not lose sense of smell nor taste.   Review of Systems Patient Active Problem List   Diagnosis Date Noted  . Migraine 12/12/2015  . History of tobacco abuse 04/30/2015  . Hyperlipidemia 04/30/2015  . Essential hypertension 04/30/2015  . Diabetes mellitus without complication ([place]) 94/70/9628  . Obesity 04/30/2015  . Chest pain at rest 04/28/2015  . Dizziness 04/11/2015   Allergies as of 09/13/2019   No Known Allergies     Medication List       Accurate as of September 13, 2019 10:26 AM. If you have any questions, ask your nurse or doctor.        acetaminophen 500 MG tablet Commonly known as: TYLENOL Take 500 mg by mouth every 6 (six) hours as needed.   albuterol (2.5 MG/3ML) 0.083% nebulizer solution Commonly known as: PROVENTIL Take 2.5 mg by nebulization every 6 (six) hours as needed for wheezing or shortness of breath.   amitriptyline 50 MG tablet Commonly known as: ELAVIL Take 2 tablets (100 mg total) by mouth at bedtime.   amLODipine 5 MG tablet Commonly known as: NORVASC TAKE ONE TABLET BY MOUTH EVERY DAY   aspirin 81 MG chewable tablet Chew 1 tablet (81 mg total) by mouth daily.   atorvastatin 40 MG tablet Commonly known as: LIPITOR TAKE ONE TABLET BY MOUTH EVERY DAY   gabapentin 300 MG capsule Commonly known as: NEURONTIN TAKE ONE CAPSULE BY MOUTH 2 TIMES A DAY   glipiZIDE 5 MG tablet Commonly known as: GLUCOTROL Take 1 tablet (5 mg total) by mouth daily before breakfast.   lisinopril 20 MG tablet Commonly known as: ZESTRIL TAKE ONE TABLET BY MOUTH EVERY DAY   meloxicam 15 MG tablet Commonly known as:  Mobic Take 1 tablet (15 mg total) by mouth daily.   metFORMIN 500 MG tablet Commonly known as: GLUCOPHAGE Take 1 tablet (500 mg total) by mouth 1 day or 1 dose. 1 daily at night   metoprolol tartrate 25 MG tablet Commonly known as: LOPRESSOR TAKE ONE TABLET BY MOUTH 2 TIMES A DAY   pantoprazole 20 MG tablet Commonly known as: PROTONIX TAKE 1 TABLET BY MOUTH EVERY DAY          Objective:   Physical Exam  No PE     Assessment & Plan:  1. Essential hypertension Appears to be at target. - Comp Met (CMET); Future  2. Diabetes mellitus without complication (HCC) Z6O continues to be at target. No further symptoms of hyperglycemia have been reported. Except for some stomach cramping over the weekend, all symptoms appear to be stable. - CBC; Future - UA/M w/rflx Culture, Routine; Future - HgB A1c; Future - T4 AND TSH; Future - PSA; Future    Plan is to recheck in 66mowith appropriate labs a week prior.

## 2019-09-19 ENCOUNTER — Other Ambulatory Visit: Payer: Self-pay

## 2019-09-19 ENCOUNTER — Ambulatory Visit: Payer: Self-pay | Admitting: Specialist

## 2019-09-19 DIAGNOSIS — M25561 Pain in right knee: Secondary | ICD-10-CM

## 2019-09-19 NOTE — Progress Notes (Signed)
   Subjective:    Patient ID: Roy Norton, male    DOB: 29-Nov-1960, 59 y.o.   MRN: [id number]  HPI 59 y/o with several year history of R knee pain. Several years ago, he was told he had a spur and should have arthroscopic surgery which he could not afford. Periodically the pain is severe enough that he cannot walk. 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   Review of Systems     Objective:   Physical Exam Px.  His gait is normal. He is able to heel/toe walk. He accomplishes tandem gait. He is able to do a full squat but has difficulty rising.  He leg alignment is normal.   He points to the MFC as the area of tenderness  His "Q" angle is normal.  There is no erythremia or obvious swelling.He has a 4 in scar superior medially from an injury 5 years ago.  He has full active EXT with crepitus.  Supine, there is no effusion.   ROM: 0-130 degrees  There is no M/L laxity. The [place] is neg.   He is TTP over the MJL     Assessment & Plan:  Impression, probable Deg meniscal tear of the L knee. Recommend MRI of R knee

## 2019-10-09 ENCOUNTER — Ambulatory Visit
Admission: RE | Admit: 2019-10-09 | Discharge: 2019-10-09 | Disposition: A | Discharge status: Home / Self Care | Payer: Self-pay | Source: Ambulatory Visit | Attending: Specialist | Admitting: Specialist

## 2019-10-09 ENCOUNTER — Other Ambulatory Visit: Payer: Self-pay

## 2019-10-09 DIAGNOSIS — M25561 Pain in right knee: Secondary | ICD-10-CM | POA: Insufficient documentation

## 2019-10-10 ENCOUNTER — Other Ambulatory Visit: Payer: Self-pay | Admitting: Gerontology

## 2019-10-10 DIAGNOSIS — M25561 Pain in right knee: Secondary | ICD-10-CM

## 2019-10-25 ENCOUNTER — Other Ambulatory Visit: Payer: Self-pay | Admitting: Gerontology

## 2019-10-25 DIAGNOSIS — E119 Type 2 diabetes mellitus without complications: Secondary | ICD-10-CM

## 2019-10-31 ENCOUNTER — Ambulatory Visit: Payer: Self-pay | Admitting: Specialist

## 2019-10-31 ENCOUNTER — Other Ambulatory Visit: Payer: Self-pay

## 2019-10-31 DIAGNOSIS — M25561 Pain in right knee: Secondary | ICD-10-CM

## 2019-10-31 NOTE — Progress Notes (Signed)
° °  Subjective:    Patient ID: TRIG MCBRYAR, male    DOB: 1961-03-31, 59 y.o.   MRN: [id number]  HPI Today he is having a bad day.    Review of Systems     Objective:   Physical Exam Deferred       Assessment & Plan:  Osteoarthritis of the R knee. Degenerative tears of the medial meniscus. Refer to [facility] for further treatment.

## 2019-11-01 ENCOUNTER — Other Ambulatory Visit: Payer: Self-pay | Admitting: Specialist

## 2019-11-01 DIAGNOSIS — M25561 Pain in right knee: Secondary | ICD-10-CM

## 2019-11-22 ENCOUNTER — Ambulatory Visit: Payer: Self-pay | Admitting: Orthopedic Surgery

## 2020-01-01 ENCOUNTER — Other Ambulatory Visit: Payer: Self-pay | Admitting: Internal Medicine

## 2020-01-01 DIAGNOSIS — K219 Gastro-esophageal reflux disease without esophagitis: Secondary | ICD-10-CM

## 2020-02-06 ENCOUNTER — Other Ambulatory Visit: Payer: Self-pay

## 2020-02-06 DIAGNOSIS — I10 Essential (primary) hypertension: Secondary | ICD-10-CM

## 2020-02-06 MED ORDER — AMLODIPINE BESYLATE 5 MG PO TABS: 5.0000 mg | ORAL_TABLET | Freq: Every day | ORAL | 0 refills | Status: DC

## 2020-02-06 MED ORDER — LISINOPRIL 20 MG PO TABS: 20.0000 mg | ORAL_TABLET | Freq: Every day | ORAL | 0 refills | Status: DC

## 2020-03-06 ENCOUNTER — Other Ambulatory Visit: Payer: Self-pay

## 2020-03-06 DIAGNOSIS — E119 Type 2 diabetes mellitus without complications: Secondary | ICD-10-CM

## 2020-03-06 DIAGNOSIS — I10 Essential (primary) hypertension: Secondary | ICD-10-CM

## 2020-03-07 ENCOUNTER — Encounter: Payer: Self-pay | Admitting: Gerontology

## 2020-03-07 ENCOUNTER — Ambulatory Visit: Payer: Self-pay | Admitting: Gerontology

## 2020-03-07 VITALS — BP 134/90 | HR 74 | Ht 70.0 in | Wt 210.0 lb

## 2020-03-07 DIAGNOSIS — E785 Hyperlipidemia, unspecified: Secondary | ICD-10-CM

## 2020-03-07 DIAGNOSIS — Z87898 Personal history of other specified conditions: Secondary | ICD-10-CM | POA: Insufficient documentation

## 2020-03-07 DIAGNOSIS — K219 Gastro-esophageal reflux disease without esophagitis: Secondary | ICD-10-CM

## 2020-03-07 DIAGNOSIS — I10 Essential (primary) hypertension: Secondary | ICD-10-CM

## 2020-03-07 DIAGNOSIS — E119 Type 2 diabetes mellitus without complications: Secondary | ICD-10-CM

## 2020-03-07 DIAGNOSIS — Z0000 Encounter for general adult medical examination without abnormal findings: Secondary | ICD-10-CM

## 2020-03-07 LAB — PSA: Prostate Specific Ag, Serum: 0.4 ng/mL (ref 0.0–4.0)

## 2020-03-07 LAB — SPECIMEN STATUS

## 2020-03-07 LAB — SPECIMEN STATUS REPORT

## 2020-03-07 MED ORDER — AMLODIPINE BESYLATE 5 MG PO TABS: 5.0000 mg | ORAL_TABLET | Freq: Every day | ORAL | 3 refills | Status: DC

## 2020-03-07 MED ORDER — ALBUTEROL SULFATE (2.5 MG/3ML) 0.083% IN NEBU: 2.5000 mg | INHALATION_SOLUTION | Freq: Four times a day (QID) | RESPIRATORY_TRACT | 2 refills | Status: DC | PRN

## 2020-03-07 MED ORDER — ASPIRIN 81 MG PO CHEW: 81.0000 mg | CHEWABLE_TABLET | Freq: Every day | ORAL | 1 refills | Status: AC

## 2020-03-07 MED ORDER — GLIPIZIDE 5 MG PO TABS: 5.0000 mg | ORAL_TABLET | Freq: Every day | ORAL | 1 refills | Status: DC

## 2020-03-07 MED ORDER — PANTOPRAZOLE SODIUM 20 MG PO TBEC: 20.0000 mg | DELAYED_RELEASE_TABLET | Freq: Every day | ORAL | 3 refills | Status: DC

## 2020-03-07 MED ORDER — METFORMIN HCL 500 MG PO TABS: 500.0000 mg | ORAL_TABLET | ORAL | 1 refills | Status: DC

## 2020-03-07 MED ORDER — ATORVASTATIN CALCIUM 40 MG PO TABS: 40.0000 mg | ORAL_TABLET | Freq: Every day | ORAL | 3 refills | Status: DC

## 2020-03-07 MED ORDER — METOPROLOL TARTRATE 25 MG PO TABS: ORAL_TABLET | ORAL | 2 refills | Status: DC

## 2020-03-07 MED ORDER — AMITRIPTYLINE HCL 50 MG PO TABS: 100.0000 mg | ORAL_TABLET | Freq: Every day | ORAL | 2 refills | Status: DC

## 2020-03-07 MED ORDER — LISINOPRIL 20 MG PO TABS: 20.0000 mg | ORAL_TABLET | Freq: Every day | ORAL | 3 refills | Status: DC

## 2020-03-07 NOTE — Progress Notes (Signed)
Established Patient Office Visit  Subjective:  Patient ID: Roy Norton, male    DOB: Apr 20, 1961  Age: 59 y.o. MRN: [id number]  CC:  Chief Complaint  Patient presents with  . Hypertension  . Diabetes    HPI Roy Norton 59 y.o male who presents for follow up for hypertension, type 2 diabetes, hyperlipidemia and acid reflux.  He reports being compliant with his treatment regimen and continues to make healthy lifestyle changes. His last HgbA1c done on 08/30/2019 was 6.1%, and he checks his blood glucose every 2 weeks. He states that his fasting blood glucose is usually less than 130 mg/dl. He also reports checking his blood pressure weekly and states that it runs about <140/4690mmHg. His POCT blood glucose was 97 mg/dL. He also reports that his acid reflux is under control. His PSA completed 03/07/2020 was noted to be 0.4ng/mL. He denies numbness or tingling,denies fever, chills, cough, and chest pain. He states that his breathing is stable and denies shortness of breath or dyspnea on exertion. Overall, he states that he is doing well and offers no additional complaint.   Past Medical History:  Diagnosis Date  . Diabetes mellitus without complication (HCC)   . GERD (gastroesophageal reflux disease)   . Hyperlipidemia   . Hypertension   . Stroke [facility](HCC)     Past Surgical History:  Procedure Laterality Date  . CARDIAC CATHETERIZATION  2013    Family History  Problem Relation Age of Onset  . Hypertension Mother   . Hypertension Father   . Diabetes Father     Social History   Socioeconomic History  . Marital status: Single    Spouse name: Not on file  . Number of children: Not on file  . Years of education: Not on file  . Highest education level: 11th grade  Occupational History  . Occupation: none  Tobacco Use  . Smoking status: Former Smoker    Years: 10.00    Types: Cigarettes    Quit date: 12/18/2015    Years since quitting: 4.2  . Smokeless tobacco: Never Used   Vaping Use  . Vaping Use: Never used  Substance and Sexual Activity  . Alcohol use: No    Alcohol/week: 0.0 standard drinks  . Drug use: No  . Sexual activity: Not Currently  Other Topics Concern  . Not on file  Social History Narrative   Rents, 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   Social Determinants of Health   Financial Resource Strain:   . Difficulty of Paying Living Expenses:   Food Insecurity:   . Worried About Programme researcher, broadcasting/film/videounning Out of Food in the Last Year:   . Baristaan Out of Food in the Last Year:   Transportation Needs:   . Freight forwarderLack of Transportation (Medical):   Marland Kitchen. Lack of Transportation (Non-Medical):   Physical Activity:   . Days of Exercise per Week:   . Minutes of Exercise per Session:   Stress:   . Feeling of Stress :   Social Connections:   . Frequency of Communication with Friends and Family:   . Frequency of Social Gatherings with Friends and Family:   . Attends Religious Services:   . Active Member of Clubs or Organizations:   . Attends BankerClub or Organization Meetings:   Marland Kitchen. Marital Status:   Intimate Partner Violence:   . Fear of Current or Ex-Partner:   . Emotionally Abused:   Marland Kitchen. Physically Abused:   . Sexually Abused:  Outpatient Medications Prior to Visit  Medication Sig Dispense Refill  . gabapentin (NEURONTIN) 300 MG capsule TAKE ONE CAPSULE BY MOUTH 2 TIMES A DAY 60 capsule 0  . albuterol (PROVENTIL) (2.5 MG/3ML) 0.083% nebulizer solution Take 2.5 mg by nebulization every 6 (six) hours as needed for wheezing or shortness of breath.    Marland Kitchen amitriptyline (ELAVIL) 50 MG tablet Take 2 tablets (100 mg total) by mouth at bedtime. 60 tablet 3  . amLODipine (NORVASC) 5 MG tablet Take 1 tablet (5 mg total) by mouth daily. 30 tablet 0  . aspirin 81 MG chewable tablet Chew 1 tablet (81 mg total) by mouth daily. 90 tablet 1  . atorvastatin (LIPITOR) 40 MG tablet TAKE ONE TABLET BY MOUTH EVERY DAY 90 tablet 0  . glipiZIDE (GLUCOTROL) 5 MG tablet Take 1  tablet (5 mg total) by mouth daily before breakfast. 90 tablet 3  . lisinopril (ZESTRIL) 20 MG tablet Take 1 tablet (20 mg total) by mouth daily. 30 tablet 0  . metFORMIN (GLUCOPHAGE) 500 MG tablet Take 1 tablet (500 mg total) by mouth 1 day or 1 dose. 1 daily at night 90 tablet 3  . metoprolol tartrate (LOPRESSOR) 25 MG tablet TAKE ONE TABLET BY MOUTH 2 TIMES A DAY 180 tablet 0  . pantoprazole (PROTONIX) 20 MG tablet TAKE 1 TABLET BY MOUTH EVERY DAY 90 tablet 0  . acetaminophen (TYLENOL) 500 MG tablet Take 500 mg by mouth every 6 (six) hours as needed.    . meloxicam (MOBIC) 15 MG tablet Take 1 tablet (15 mg total) by mouth daily. 30 tablet 2   No facility-administered medications prior to visit.    No Known Allergies  ROS Review of Systems  Constitutional: Negative.   HENT: Negative.   Respiratory: Negative.   Cardiovascular: Negative.   Gastrointestinal: Negative.   Endocrine: Negative.   Genitourinary: Negative.   Musculoskeletal: Negative.   Skin: Negative.   Neurological: Negative.   Psychiatric/Behavioral: Negative.       Objective:    Physical Exam Constitutional:      Appearance: Normal appearance.  HENT:     Head: Normocephalic and atraumatic.  Cardiovascular:     Rate and Rhythm: Normal rate and regular rhythm.     Pulses: Normal pulses.     Heart sounds: Normal heart sounds.  Pulmonary:     Effort: Pulmonary effort is normal.     Breath sounds: Normal breath sounds.  Abdominal:     General: Bowel sounds are normal.  Neurological:     General: No focal deficit present.     Mental Status: He is alert and oriented to person, place, and time.  Psychiatric:        Mood and Affect: Mood normal.        Behavior: Behavior normal.        Thought Content: Thought content normal.        Judgment: Judgment normal.     BP 134/90 (BP Location: Left Arm, Patient Position: Sitting)   Pulse 74   Ht 5\' 10"  (1.778 m)   Wt 210 lb (95.3 kg)   SpO2 98%   BMI 30.13  kg/m  Wt Readings from Last 3 Encounters:  03/07/20 210 lb (95.3 kg)  03/06/20 210 lb 14.4 oz (95.7 kg)  09/13/19 211 lb 8 oz (95.9 kg)    He was encouraged to continue on his weight loss regimen.   Health Maintenance Due  Topic Date Due  . Hepatitis C Screening  Never done  . HIV Screening  Never done  . TETANUS/TDAP  Never done  . COLONOSCOPY  Never done  . OPHTHALMOLOGY EXAM  09/03/2017  . HEMOGLOBIN A1C  02/27/2020  . COVID-19 Vaccine (2 - Moderna 2-dose series) 03/11/2020    There are no preventive care reminders to display for this patient.  Lab Results  Component Value Date   TSH WILL FOLLOW 03/06/2020   Lab Results  Component Value Date   WBC WILL FOLLOW 03/06/2020   WBC 5.9 03/06/2020   HGB WILL FOLLOW 03/06/2020   HGB 15.5 03/06/2020   HCT WILL FOLLOW 03/06/2020   HCT 45.4 03/06/2020   MCV WILL FOLLOW 03/06/2020   MCV 93 03/06/2020   PLT WILL FOLLOW 03/06/2020   PLT 191 03/06/2020   Lab Results  Component Value Date   NA 140 03/06/2020   K 4.3 03/06/2020   CO2 21 03/06/2020   GLUCOSE 108 (H) 03/06/2020   BUN 13 03/06/2020   CREATININE 0.87 03/06/2020   BILITOT 0.3 03/06/2020   ALKPHOS 80 03/06/2020   AST 25 03/06/2020   ALT 34 03/06/2020   PROT 7.5 03/06/2020   ALBUMIN 4.7 03/06/2020   CALCIUM 9.4 03/06/2020   ANIONGAP 9 08/04/2018   Lab Results  Component Value Date   CHOL 114 05/17/2019   Lab Results  Component Value Date   HDL 37 (L) 05/17/2019   Lab Results  Component Value Date   LDLCALC 60 05/17/2019   Lab Results  Component Value Date   TRIG 87 05/17/2019   Lab Results  Component Value Date   CHOLHDL 3.1 05/17/2019   Lab Results  Component Value Date   HGBA1C WILL FOLLOW 03/06/2020      Assessment & Plan:  1. Hyperlipidemia associated with type 2 diabetes mellitus (HCC) He will continue with current treatment regimen  - atorvastatin (LIPITOR) 40 MG tablet; Take 1 tablet (40 mg total) by mouth daily.  Dispense: 30  tablet; Refill: 3 -He was advised to continue low fat/cholesterol diet and exercise as tolerated.  2. Diabetes mellitus without complication (HCC) His diabetes is under control. He will continue with current treatment regimen. - glipiZIDE (GLUCOTROL) 5 MG tablet; Take 1 tablet (5 mg total) by mouth daily before breakfast.  Dispense: 90 tablet; Refill: 1 - metFORMIN (GLUCOPHAGE) 500 MG tablet; Take 1 tablet (500 mg total) by mouth 1 day or 1 dose. 1 daily at night  Dispense: 90 tablet; Refill: 1 - HgB A1c; Future - Ambulatory referral to Ophthalmology -He was advised to decrease intake of concentrated sweets   3. Essential hypertension His hypertension is under control. He will continue with current treatment regimen. - amLODipine (NORVASC) 5 MG tablet; Take 1 tablet (5 mg total) by mouth daily.  Dispense: 30 tablet; Refill: 3 - aspirin 81 MG chewable tablet; Chew 1 tablet (81 mg total) by mouth daily.  Dispense: 90 tablet; Refill: 1 - lisinopril (ZESTRIL) 20 MG tablet; Take 1 tablet (20 mg total) by mouth daily.  Dispense: 30 tablet; Refill: 3 - metoprolol tartrate (LOPRESSOR) 25 MG tablet; TAKE ONE TABLET BY MOUTH 2 TIMES A DAY  Dispense: 60 tablet; Refill: 2 -He was advised to continue DASH diet and exercise as tolerated.   4. Gastroesophageal reflux disease He states that his acid reflux is under control. He will continue with current treatment regimen.  -Avoid spicy, fatty and fried food -Avoid sodas and sour juices -Avoid heavy meals -Avoid eating 4 hours before bedtime -Elevate head of  bed at night - pantoprazole (PROTONIX) 20 MG tablet; Take 1 tablet (20 mg total) by mouth daily.  Dispense: 30 tablet; Refill: 3  5. History of shortness of breath He states that his breathing is stable. He will continue with current treatment regimen. - albuterol (PROVENTIL) (2.5 MG/3ML) 0.083% nebulizer solution; Take 3 mLs (2.5 mg total) by nebulization every 6 (six) hours as needed for wheezing  or shortness of breath.  Dispense: 75 mL; Refill: 2 - He was advised on smoking cessation and was provided with the [employer] Quitline information.  6. History of insomnia He will continue with current treatment regimen. - amitriptyline (ELAVIL) 50 MG tablet; Take 2 tablets (100 mg total) by mouth at bedtime.  Dispense: 60 tablet; Refill: 2  7. Health care maintenance He is due for colonoscopy and will be refred to gastroenterology  - Ambulatory referral to Gastroenterology      Follow-up: Return in about 3 months (around 06/12/2020), or if symptoms worsen or fail to improve.    Onnie Graham, RN

## 2020-03-07 NOTE — Patient Instructions (Signed)
Fat and Cholesterol Restricted Eating Plan Getting too much fat and cholesterol in your diet may cause health problems. Choosing the right foods helps keep your fat and cholesterol at normal levels. This can keep you from getting certain diseases. Your doctor may recommend an eating plan that includes:  Total fat: ______% or less of total calories a day.  Saturated fat: ______% or less of total calories a day.  Cholesterol: less than _________mg a day.  Fiber: ______g a day. What are tips for following this plan? Meal planning  At meals, divide your plate into four equal parts: ? Fill one-half of your plate with vegetables and green salads. ? Fill one-fourth of your plate with whole grains. ? Fill one-fourth of your plate with low-fat (lean) protein foods.  Eat fish that is high in omega-3 fats at least two times a week. This includes mackerel, tuna, sardines, and salmon.  Eat foods that are high in fiber, such as whole grains, beans, apples, broccoli, carrots, peas, and barley. General tips   Work with your doctor to lose weight if you need to.  Avoid: ? Foods with added sugar. ? Fried foods. ? Foods with partially hydrogenated oils.  Limit alcohol intake to no more than 1 drink a day for nonpregnant women and 2 drinks a day for men. One drink equals 12 oz of beer, 5 oz of wine, or 1 oz of hard liquor. Reading food labels  Check food labels for: ? Trans fats. ? Partially hydrogenated oils. ? Saturated fat (g) in each serving. ? Cholesterol (mg) in each serving. ? Fiber (g) in each serving.  Choose foods with healthy fats, such as: ? Monounsaturated fats. ? Polyunsaturated fats. ? Omega-3 fats.  Choose grain products that have whole grains. Look for the word "whole" as the first word in the ingredient list. Cooking  Cook foods using low-fat methods. These include baking, boiling, grilling, and broiling.  Eat more home-cooked foods. Eat at restaurants and buffets  less often.  Avoid cooking using saturated fats, such as butter, cream, palm oil, palm kernel oil, and coconut oil. Recommended foods  Fruits  All fresh, canned (in natural juice), or frozen fruits. Vegetables  Fresh or frozen vegetables (raw, steamed, roasted, or grilled). Green salads. Grains  Whole grains, such as whole wheat or whole grain breads, crackers, cereals, and pasta. Unsweetened oatmeal, bulgur, barley, quinoa, or brown rice. Corn or whole wheat flour tortillas. Meats and other protein foods  Ground beef (85% or leaner), grass-fed beef, or beef trimmed of fat. Skinless chicken or turkey. Ground chicken or turkey. Pork trimmed of fat. All fish and seafood. Egg whites. Dried beans, peas, or lentils. Unsalted nuts or seeds. Unsalted canned beans. Nut butters without added sugar or oil. Dairy  Low-fat or nonfat dairy products, such as skim or 1% milk, 2% or reduced-fat cheeses, low-fat and fat-free ricotta or cottage cheese, or plain low-fat and nonfat yogurt. Fats and oils  Tub margarine without trans fats. Light or reduced-fat mayonnaise and salad dressings. Avocado. Olive, canola, sesame, or safflower oils. The items listed above may not be a complete list of foods and beverages you can eat. Contact a dietitian for more information. Foods to avoid Fruits  Canned fruit in heavy syrup. Fruit in cream or butter sauce. Fried fruit. Vegetables  Vegetables cooked in cheese, cream, or butter sauce. Fried vegetables. Grains  White bread. White pasta. White rice. Cornbread. Bagels, pastries, and croissants. Crackers and snack foods that contain trans fat   and hydrogenated oils. Meats and other protein foods  Fatty cuts of meat. Ribs, chicken wings, bacon, sausage, bologna, salami, chitterlings, fatback, hot dogs, bratwurst, and packaged lunch meats. Liver and organ meats. Whole eggs and egg yolks. Chicken and turkey with skin. Fried meat. Dairy  Whole or 2% milk, cream,  half-and-half, and cream cheese. Whole milk cheeses. Whole-fat or sweetened yogurt. Full-fat cheeses. Nondairy creamers and whipped toppings. Processed cheese, cheese spreads, and cheese curds. Beverages  Alcohol. Sugar-sweetened drinks such as sodas, lemonade, and fruit drinks. Fats and oils  Butter, stick margarine, lard, shortening, ghee, or bacon fat. Coconut, palm kernel, and palm oils. Sweets and desserts  Corn syrup, sugars, honey, and molasses. Candy. Jam and jelly. Syrup. Sweetened cereals. Cookies, pies, cakes, donuts, muffins, and ice cream. The items listed above may not be a complete list of foods and beverages you should avoid. Contact a dietitian for more information. Summary  Choosing the right foods helps keep your fat and cholesterol at normal levels. This can keep you from getting certain diseases.  At meals, fill one-half of your plate with vegetables and green salads.  Eat high-fiber foods, like whole grains, beans, apples, carrots, peas, and barley.  Limit added sugar, saturated fats, alcohol, and fried foods. This information is not intended to replace advice given to you by your health care provider. Make sure you discuss any questions you have with your health care provider. Document Revised: 04/13/2018 Document Reviewed: 04/27/2017 Elsevier Patient Education  2020 [employer] DASH Eating Plan DASH stands for "Dietary Approaches to Stop Hypertension." The DASH eating plan is a healthy eating plan that has been shown to reduce high blood pressure (hypertension). It may also reduce your risk for type 2 diabetes, heart disease, and stroke. The DASH eating plan may also help with weight loss. What are tips for following this plan?  General guidelines  Avoid eating more than 2,300 mg (milligrams) of salt (sodium) a day. If you have hypertension, you may need to reduce your sodium intake to 1,500 mg a day.  Limit alcohol intake to no more than 1 drink a day for  nonpregnant women and 2 drinks a day for men. One drink equals 12 oz of beer, 5 oz of wine, or 1 oz of hard liquor.  Work with your health care provider to maintain a healthy body weight or to lose weight. Ask what an ideal weight is for you.  Get at least 30 minutes of exercise that causes your heart to beat faster (aerobic exercise) most days of the week. Activities may include walking, swimming, or biking.  Work with your health care provider or diet and nutrition specialist (dietitian) to adjust your eating plan to your individual calorie needs. Reading food labels   Check food labels for the amount of sodium per serving. Choose foods with less than 5 percent of the Daily Value of sodium. Generally, foods with less than 300 mg of sodium per serving fit into this eating plan.  To find whole grains, look for the word "whole" as the first word in the ingredient list. Shopping  Buy products labeled as "low-sodium" or "no salt added."  Buy fresh foods. Avoid canned foods and premade or frozen meals. Cooking  Avoid adding salt when cooking. Use salt-free seasonings or herbs instead of table salt or sea salt. Check with your health care provider or pharmacist before using salt substitutes.  Do not fry foods. Cook foods using healthy methods such as baking, boiling, grilling,   and broiling instead.  Cook with heart-healthy oils, such as olive, canola, soybean, or sunflower oil. Meal planning  Eat a balanced diet that includes: ? 5 or more servings of fruits and vegetables each day. At each meal, try to fill half of your plate with fruits and vegetables. ? Up to 6-8 servings of whole grains each day. ? Less than 6 oz of lean meat, poultry, or fish each day. A 3-oz serving of meat is about the same size as a deck of cards. One egg equals 1 oz. ? 2 servings of low-fat dairy each day. ? A serving of nuts, seeds, or beans 5 times each week. ? Heart-healthy fats. Healthy fats called Omega-3  fatty acids are found in foods such as flaxseeds and coldwater fish, like sardines, salmon, and mackerel.  Limit how much you eat of the following: ? Canned or prepackaged foods. ? Food that is high in trans fat, such as fried foods. ? Food that is high in saturated fat, such as fatty meat. ? Sweets, desserts, sugary drinks, and other foods with added sugar. ? Full-fat dairy products.  Do not salt foods before eating.  Try to eat at least 2 vegetarian meals each week.  Eat more home-cooked food and less restaurant, buffet, and fast food.  When eating at a restaurant, ask that your food be prepared with less salt or no salt, if possible. What foods are recommended? The items listed may not be a complete list. Talk with your dietitian about what dietary choices are best for you. Grains Whole-grain or whole-wheat bread. Whole-grain or whole-wheat pasta. Brown rice. Oatmeal. Quinoa. Bulgur. Whole-grain and low-sodium cereals. Pita bread. Low-fat, low-sodium crackers. Whole-wheat flour tortillas. Vegetables Fresh or frozen vegetables (raw, steamed, roasted, or grilled). Low-sodium or reduced-sodium tomato and vegetable juice. Low-sodium or reduced-sodium tomato sauce and tomato paste. Low-sodium or reduced-sodium canned vegetables. Fruits All fresh, dried, or frozen fruit. Canned fruit in natural juice (without added sugar). Meat and other protein foods Skinless chicken or turkey. Ground chicken or turkey. Pork with fat trimmed off. Fish and seafood. Egg whites. Dried beans, peas, or lentils. Unsalted nuts, nut butters, and seeds. Unsalted canned beans. Lean cuts of beef with fat trimmed off. Low-sodium, lean deli meat. Dairy Low-fat (1%) or fat-free (skim) milk. Fat-free, low-fat, or reduced-fat cheeses. Nonfat, low-sodium ricotta or cottage cheese. Low-fat or nonfat yogurt. Low-fat, low-sodium cheese. Fats and oils Soft margarine without trans fats. Vegetable oil. Low-fat, reduced-fat, or  light mayonnaise and salad dressings (reduced-sodium). Canola, safflower, olive, soybean, and sunflower oils. Avocado. Seasoning and other foods Herbs. Spices. Seasoning mixes without salt. Unsalted popcorn and pretzels. Fat-free sweets. What foods are not recommended? The items listed may not be a complete list. Talk with your dietitian about what dietary choices are best for you. Grains Baked goods made with fat, such as croissants, muffins, or some breads. Dry pasta or rice meal packs. Vegetables Creamed or fried vegetables. Vegetables in a cheese sauce. Regular canned vegetables (not low-sodium or reduced-sodium). Regular canned tomato sauce and paste (not low-sodium or reduced-sodium). Regular tomato and vegetable juice (not low-sodium or reduced-sodium). Pickles. Olives. Fruits Canned fruit in a light or heavy syrup. Fried fruit. Fruit in cream or butter sauce. Meat and other protein foods Fatty cuts of meat. Ribs. Fried meat. Bacon. Sausage. Bologna and other processed lunch meats. Salami. Fatback. Hotdogs. Bratwurst. Salted nuts and seeds. Canned beans with added salt. Canned or smoked fish. Whole eggs or egg yolks. Chicken or turkey   with skin. Dairy Whole or 2% milk, cream, and half-and-half. Whole or full-fat cream cheese. Whole-fat or sweetened yogurt. Full-fat cheese. Nondairy creamers. Whipped toppings. Processed cheese and cheese spreads. Fats and oils Butter. Stick margarine. Lard. Shortening. Ghee. Bacon fat. Tropical oils, such as coconut, palm kernel, or palm oil. Seasoning and other foods Salted popcorn and pretzels. Onion salt, garlic salt, seasoned salt, table salt, and sea salt. Worcestershire sauce. Tartar sauce. Barbecue sauce. Teriyaki sauce. Soy sauce, including reduced-sodium. Steak sauce. Canned and packaged gravies. Fish sauce. Oyster sauce. Cocktail sauce. Horseradish that you find on the shelf. Ketchup. Mustard. Meat flavorings and tenderizers. Bouillon cubes. Hot  sauce and Tabasco sauce. Premade or packaged marinades. Premade or packaged taco seasonings. Relishes. Regular salad dressings. Where to find more information:  National Heart, Lung, and Blood Institute: www.nhlbi.nih.gov  American Heart Association: www.heart.org Summary  The DASH eating plan is a healthy eating plan that has been shown to reduce high blood pressure (hypertension). It may also reduce your risk for type 2 diabetes, heart disease, and stroke.  With the DASH eating plan, you should limit salt (sodium) intake to 2,300 mg a day. If you have hypertension, you may need to reduce your sodium intake to 1,500 mg a day.  When on the DASH eating plan, aim to eat more fresh fruits and vegetables, whole grains, lean proteins, low-fat dairy, and heart-healthy fats.  Work with your health care provider or diet and nutrition specialist (dietitian) to adjust your eating plan to your individual calorie needs. This information is not intended to replace advice given to you by your health care provider. Make sure you discuss any questions you have with your health care provider. Document Revised: 07/23/2017 Document Reviewed: 08/03/2016 Elsevier Patient Education  2020 Elsevier Inc. Carbohydrate Counting for Diabetes Mellitus, Adult  Carbohydrate counting is a method of keeping track of how many carbohydrates you eat. Eating carbohydrates naturally increases the amount of sugar (glucose) in the blood. Counting how many carbohydrates you eat helps keep your blood glucose within normal limits, which helps you manage your diabetes (diabetes mellitus). It is important to know how many carbohydrates you can safely have in each meal. This is different for every person. A diet and nutrition specialist (registered dietitian) can help you make a meal plan and calculate how many carbohydrates you should have at each meal and snack. Carbohydrates are found in the following foods:  Grains, such as breads  and cereals.  Dried beans and soy products.  Starchy vegetables, such as potatoes, peas, and corn.  Fruit and fruit juices.  Milk and yogurt.  Sweets and snack foods, such as cake, cookies, candy, chips, and soft drinks. How do I count carbohydrates? There are two ways to count carbohydrates in food. You can use either of the methods or a combination of both. Reading "Nutrition Facts" on packaged food The "Nutrition Facts" list is included on the labels of almost all packaged foods and beverages in the [place] It includes:  The serving size.  Information about nutrients in each serving, including the grams (g) of carbohydrate per serving. To use the "Nutrition Facts":  Decide how many servings you will have.  Multiply the number of servings by the number of carbohydrates per serving.  The resulting number is the total amount of carbohydrates that you will be having. Learning standard serving sizes of other foods When you eat carbohydrate foods that are not packaged or do not include "Nutrition Facts" on the label, you need   to measure the servings in order to count the amount of carbohydrates:  Measure the foods that you will eat with a food scale or measuring cup, if needed.  Decide how many standard-size servings you will eat.  Multiply the number of servings by 15. Most carbohydrate-rich foods have about 15 g of carbohydrates per serving. ? For example, if you eat 8 oz (170 g) of strawberries, you will have eaten 2 servings and 30 g of carbohydrates (2 servings x 15 g = 30 g).  For foods that have more than one food mixed, such as soups and casseroles, you must count the carbohydrates in each food that is included. The following list contains standard serving sizes of common carbohydrate-rich foods. Each of these servings has about 15 g of carbohydrates:   hamburger bun or  English muffin.   oz (15 mL) syrup.   oz (14 g) jelly.  1 slice of bread.  1 six-inch  tortilla.  3 oz (85 g) cooked rice or pasta.  4 oz (113 g) cooked dried beans.  4 oz (113 g) starchy vegetable, such as peas, corn, or potatoes.  4 oz (113 g) hot cereal.  4 oz (113 g) mashed potatoes or  of a large baked potato.  4 oz (113 g) canned or frozen fruit.  4 oz (120 mL) fruit juice.  4-6 crackers.  6 chicken nuggets.  6 oz (170 g) unsweetened dry cereal.  6 oz (170 g) plain fat-free yogurt or yogurt sweetened with artificial sweeteners.  8 oz (240 mL) milk.  8 oz (170 g) fresh fruit or one small piece of fruit.  24 oz (680 g) popped popcorn. Example of carbohydrate counting Sample meal  3 oz (85 g) chicken breast.  6 oz (170 g) brown rice.  4 oz (113 g) corn.  8 oz (240 mL) milk.  8 oz (170 g) strawberries with sugar-free whipped topping. Carbohydrate calculation 1. Identify the foods that contain carbohydrates: ? Rice. ? Corn. ? Milk. ? Strawberries. 2. Calculate how many servings you have of each food: ? 2 servings rice. ? 1 serving corn. ? 1 serving milk. ? 1 serving strawberries. 3. Multiply each number of servings by 15 g: ? 2 servings rice x 15 g = 30 g. ? 1 serving corn x 15 g = 15 g. ? 1 serving milk x 15 g = 15 g. ? 1 serving strawberries x 15 g = 15 g. 4. Add together all of the amounts to find the total grams of carbohydrates eaten: ? 30 g + 15 g + 15 g + 15 g = 75 g of carbohydrates total. Summary  Carbohydrate counting is a method of keeping track of how many carbohydrates you eat.  Eating carbohydrates naturally increases the amount of sugar (glucose) in the blood.  Counting how many carbohydrates you eat helps keep your blood glucose within normal limits, which helps you manage your diabetes.  A diet and nutrition specialist (registered dietitian) can help you make a meal plan and calculate how many carbohydrates you should have at each meal and snack. This information is not intended to replace advice given to you by  your health care provider. Make sure you discuss any questions you have with your health care provider. Document Revised: 03/04/2017 Document Reviewed: 01/22/2016 Elsevier Patient Education  2020 Elsevier Inc.  

## 2020-03-13 ENCOUNTER — Ambulatory Visit: Payer: Self-pay | Admitting: Internal Medicine

## 2020-03-22 ENCOUNTER — Other Ambulatory Visit: Payer: Self-pay

## 2020-03-22 ENCOUNTER — Ambulatory Visit (INDEPENDENT_AMBULATORY_CARE_PROVIDER_SITE_OTHER): Payer: Self-pay | Admitting: Gastroenterology

## 2020-03-22 DIAGNOSIS — Z1211 Encounter for screening for malignant neoplasm of colon: Secondary | ICD-10-CM

## 2020-03-22 NOTE — Progress Notes (Signed)
Gastroenterology Pre-Procedure Review  Request Date: 04/16/20 Requesting Physician: Dr. Servando Snare  PATIENT REVIEW QUESTIONS: The patient responded to the following health history questions as indicated:    1. Are you having any GI issues? no 2. Do you have a personal history of Polyps? no 3. Do you have a family history of Colon Cancer or Polyps? no 4. Diabetes Mellitus? Yes takes oral meds 5. Joint replacements in the past 12 months?no 6. Major health problems in the past 3 months?no 7. Any artificial heart valves, MVP, or defibrillator?no    MEDICATIONS & ALLERGIES:    Patient reports the following regarding taking any anticoagulation/antiplatelet therapy:   Plavix, Coumadin, Eliquis, Xarelto, Lovenox, Pradaxa, Brilinta, or Effient? no Aspirin? yes (81 mg daily)  Patient confirms/reports the following medications:  Current Outpatient Medications  Medication Sig Dispense Refill  . oxyCODONE (OXY IR/ROXICODONE) 5 MG immediate release tablet Take by mouth.    Marland Kitchen albuterol (PROVENTIL) (2.5 MG/3ML) 0.083% nebulizer solution Take 3 mLs (2.5 mg total) by nebulization every 6 (six) hours as needed for wheezing or shortness of breath. 75 mL 2  . amitriptyline (ELAVIL) 50 MG tablet Take 2 tablets (100 mg total) by mouth at bedtime. 60 tablet 2  . amLODipine (NORVASC) 5 MG tablet Take 1 tablet (5 mg total) by mouth daily. 30 tablet 3  . aspirin 81 MG chewable tablet Chew 1 tablet (81 mg total) by mouth daily. 90 tablet 1  . atorvastatin (LIPITOR) 40 MG tablet Take 1 tablet (40 mg total) by mouth daily. 30 tablet 3  . butalbital-acetaminophen-caffeine (FIORICET) 50-325-40 MG tablet Take by mouth.    . gabapentin (NEURONTIN) 300 MG capsule TAKE ONE CAPSULE BY MOUTH 2 TIMES A DAY 60 capsule 0  . glipiZIDE (GLUCOTROL) 5 MG tablet Take 1 tablet (5 mg total) by mouth daily before breakfast. 90 tablet 1  . lisinopril (ZESTRIL) 20 MG tablet Take 1 tablet (20 mg total) by mouth daily. 30 tablet 3  .  lovastatin (MEVACOR) 40 MG tablet Take by mouth.    . metFORMIN (GLUCOPHAGE) 500 MG tablet Take 1 tablet (500 mg total) by mouth 1 day or 1 dose. 1 daily at night 90 tablet 1  . metoprolol succinate (TOPROL-XL) 25 MG 24 hr tablet Take by mouth.    . metoprolol tartrate (LOPRESSOR) 25 MG tablet TAKE ONE TABLET BY MOUTH 2 TIMES A DAY 60 tablet 2  . nitroGLYCERIN (NITROSTAT) 0.4 MG SL tablet Place under the tongue.    . pantoprazole (PROTONIX) 20 MG tablet Take 1 tablet (20 mg total) by mouth daily. 30 tablet 3  . topiramate (TOPAMAX) 50 MG tablet Take by mouth.     No current facility-administered medications for this visit.    Patient confirms/reports the following allergies:  No Known Allergies  No orders of the defined types were placed in this encounter.   AUTHORIZATION INFORMATION Primary Insurance: 1D#: Group #:  Secondary Insurance: 1D#: Group #:  SCHEDULE INFORMATION: Date: 04/16/20 Time: Location:[facility]

## 2020-03-29 LAB — UA/M W/RFLX CULTURE, ROUTINE
Bilirubin, UA: NEGATIVE
Glucose, UA: NEGATIVE
Ketones, UA: NEGATIVE
Leukocytes,UA: NEGATIVE
Nitrite, UA: NEGATIVE
Protein,UA: NEGATIVE
RBC, UA: NEGATIVE
Specific Gravity, UA: 1.021 (ref 1.005–1.030)
Urobilinogen, Ur: 0.2 mg/dL (ref 0.2–1.0)
pH, UA: 7 (ref 5.0–7.5)

## 2020-03-29 LAB — CBC
Hematocrit: 45.4 % (ref 37.5–51.0)
Hemoglobin: 15.5 g/dL (ref 13.0–17.7)
MCH: 31.9 pg (ref 26.6–33.0)
MCHC: 34.1 g/dL (ref 31.5–35.7)
MCV: 93 fL (ref 79–97)
Platelets: 191 10*3/uL (ref 150–450)
RBC: 4.86 x10E6/uL (ref 4.14–5.80)
RDW: 13.3 % (ref 11.6–15.4)
WBC: 5.9 10*3/uL (ref 3.4–10.8)

## 2020-03-29 LAB — COMPREHENSIVE METABOLIC PANEL
ALT: 34 IU/L (ref 0–44)
AST: 25 IU/L (ref 0–40)
Albumin/Globulin Ratio: 1.7 (ref 1.2–2.2)
Albumin: 4.7 g/dL (ref 3.8–4.9)
Alkaline Phosphatase: 80 IU/L (ref 48–121)
BUN/Creatinine Ratio: 15 (ref 9–20)
BUN: 13 mg/dL (ref 6–24)
Bilirubin Total: 0.3 mg/dL (ref 0.0–1.2)
CO2: 21 mmol/L (ref 20–29)
Calcium: 9.4 mg/dL (ref 8.7–10.2)
Chloride: 104 mmol/L (ref 96–106)
Creatinine, Ser: 0.87 mg/dL (ref 0.76–1.27)
GFR calc Af Amer: 110 mL/min/{1.73_m2} (ref >59)
GFR calc non Af Amer: 95 mL/min/{1.73_m2} (ref >59)
Globulin, Total: 2.8 g/dL (ref 1.5–4.5)
Glucose: 108 mg/dL — ABNORMAL HIGH (ref 65–99)
Potassium: 4.3 mmol/L (ref 3.5–5.2)
Sodium: 140 mmol/L (ref 134–144)
Total Protein: 7.5 g/dL (ref 6.0–8.5)

## 2020-03-29 LAB — MICROSCOPIC EXAMINATION
Bacteria, UA: NONE SEEN
Casts: NONE SEEN /lpf
RBC, Urine: NONE SEEN /hpf (ref 0–2)
WBC, UA: NONE SEEN /hpf (ref 0–5)

## 2020-03-29 LAB — SPECIMEN STATUS REPORT

## 2020-03-29 LAB — HEMOGLOBIN A1C
Est. average glucose Bld gHb Est-mCnc: 131 mg/dL
Hgb A1c MFr Bld: 6.2 % — ABNORMAL HIGH (ref 4.8–5.6)

## 2020-03-29 LAB — T4 AND TSH
T4, Total: 9.1 ug/dL (ref 4.5–12.0)
TSH: 1.53 u[IU]/mL (ref 0.450–4.500)

## 2020-04-02 ENCOUNTER — Ambulatory Visit: Payer: Self-pay

## 2020-04-04 ENCOUNTER — Ambulatory Visit: Payer: Self-pay

## 2020-04-04 ENCOUNTER — Telehealth: Payer: Self-pay

## 2020-04-04 ENCOUNTER — Other Ambulatory Visit: Payer: Self-pay

## 2020-04-04 ENCOUNTER — Other Ambulatory Visit: Payer: Self-pay | Admitting: Gerontology

## 2020-04-04 DIAGNOSIS — E119 Type 2 diabetes mellitus without complications: Secondary | ICD-10-CM

## 2020-04-04 NOTE — Telephone Encounter (Signed)
Patient presented to appointment at [facility] with complaining of breathing issues for past week.  When asked if he had been tested for Covid in last 14 days, he stated he had at [facility] but had not heard the results yet. Informed Dr. Laverda Page of this who in turn discussed further with patient.  After speaking further with Mr. Janee Morn, patient confirmed he had not been tested.  Dr. Laverda Page instructed patient to present to ER for Covid testing and further evaluation as chest xray may be needed.  Instructed to follow up with [facility] afterwards for ongoing care once Covid status known.

## 2020-04-05 ENCOUNTER — Telehealth: Payer: Self-pay | Admitting: Pharmacy Technician

## 2020-04-05 NOTE — Telephone Encounter (Signed)
Received updated proof of income.  Patient eligible to receive medication assistance at [facility] until time for re-certification in 9359, and as long as eligibility requirements continue to be met.  [facility]

## 2020-04-10 ENCOUNTER — Other Ambulatory Visit: Payer: Self-pay

## 2020-04-10 ENCOUNTER — Encounter: Payer: Self-pay | Admitting: Emergency Medicine

## 2020-04-10 ENCOUNTER — Emergency Department: Payer: Self-pay

## 2020-04-10 DIAGNOSIS — R0602 Shortness of breath: Secondary | ICD-10-CM | POA: Insufficient documentation

## 2020-04-10 DIAGNOSIS — R05 Cough: Secondary | ICD-10-CM | POA: Insufficient documentation

## 2020-04-10 DIAGNOSIS — Z20822 Contact with and (suspected) exposure to covid-19: Secondary | ICD-10-CM | POA: Insufficient documentation

## 2020-04-10 DIAGNOSIS — Z5321 Procedure and treatment not carried out due to patient leaving prior to being seen by health care provider: Secondary | ICD-10-CM | POA: Insufficient documentation

## 2020-04-10 DIAGNOSIS — R531 Weakness: Secondary | ICD-10-CM | POA: Insufficient documentation

## 2020-04-10 LAB — CBC
HCT: 44.7 % (ref 39.0–52.0)
Hemoglobin: 15.2 g/dL (ref 13.0–17.0)
MCH: 31.2 pg (ref 26.0–34.0)
MCHC: 34 g/dL (ref 30.0–36.0)
MCV: 91.8 fL (ref 80.0–100.0)
Platelets: 238 10*3/uL (ref 150–400)
RBC: 4.87 MIL/uL (ref 4.22–5.81)
RDW: 13.8 % (ref 11.5–15.5)
WBC: 7.7 10*3/uL (ref 4.0–10.5)
nRBC: 0 % (ref 0.0–0.2)

## 2020-04-10 LAB — BASIC METABOLIC PANEL
Anion gap: 10 (ref 5–15)
BUN: <5 mg/dL — ABNORMAL LOW (ref 6–20)
CO2: 25 mmol/L (ref 22–32)
Calcium: 9.6 mg/dL (ref 8.9–10.3)
Chloride: 106 mmol/L (ref 98–111)
Creatinine, Ser: 0.91 mg/dL (ref 0.61–1.24)
GFR calc Af Amer: >60 mL/min (ref >60)
GFR calc non Af Amer: >60 mL/min (ref >60)
Glucose, Bld: 104 mg/dL — ABNORMAL HIGH (ref 70–99)
Potassium: 3.9 mmol/L (ref 3.5–5.1)
Sodium: 141 mmol/L (ref 135–145)

## 2020-04-10 LAB — TROPONIN I (HIGH SENSITIVITY)
Troponin I (High Sensitivity): <2 ng/L (ref <18)
Troponin I (High Sensitivity): <2 ng/L (ref <18)

## 2020-04-10 LAB — GLUCOSE, CAPILLARY: Glucose-Capillary: 89 mg/dL (ref 70–99)

## 2020-04-10 NOTE — ED Triage Notes (Signed)
Pt reports weakness, sob and cough for the last week.

## 2020-04-11 ENCOUNTER — Emergency Department
Admission: EM | Admit: 2020-04-11 | Discharge: 2020-04-11 | Disposition: A | Discharge status: Home / Self Care | Payer: Self-pay | Attending: Emergency Medicine | Admitting: Emergency Medicine

## 2020-04-11 DIAGNOSIS — R0602 Shortness of breath: Secondary | ICD-10-CM

## 2020-04-11 DIAGNOSIS — R059 Cough, unspecified: Secondary | ICD-10-CM

## 2020-04-11 DIAGNOSIS — R05 Cough: Secondary | ICD-10-CM

## 2020-04-11 DIAGNOSIS — R531 Weakness: Secondary | ICD-10-CM

## 2020-04-11 LAB — SARS CORONAVIRUS 2 BY RT PCR (HOSPITAL ORDER, PERFORMED IN ~~LOC~~ HOSPITAL LAB): SARS Coronavirus 2: NEGATIVE

## 2020-04-11 NOTE — ED Provider Notes (Signed)
-----------------------------------------   3:27 AM on 04/11/2020 -----------------------------------------  Patient was seen during computer downtime.  Please refer to paper H&P.   ----------------------------------------- 3:41 AM on 04/11/2020 -----------------------------------------  Patient and spouse were not in the room when nursing went to ambulate him.  Patient not in the lobby.  Presumed to have eloped.   Irean Hong, MD 04/11/20 [phone]

## 2020-04-11 NOTE — ED Notes (Signed)
Pt states that he has been feeling short of breath and that he 'just hasn't felt right'. Pt states he's also felt weak and appears pale on assessment.

## 2020-04-11 NOTE — ED Notes (Signed)
Pt seen walking down hall by this RN with visitor after pressing call light and staff was too busy to answer right away. Pt left AMA at this time, no signature obtained.

## 2020-04-12 ENCOUNTER — Telehealth: Payer: Self-pay

## 2020-04-12 ENCOUNTER — Other Ambulatory Visit: Payer: Self-pay

## 2020-04-12 DIAGNOSIS — Z1211 Encounter for screening for malignant neoplasm of colon: Secondary | ICD-10-CM

## 2020-04-12 NOTE — Telephone Encounter (Signed)
Patient's mother Hilda Lias called stating that her son was not feeling well being hoarse and with a cough. She stated that he did not have COVID-19 since he had done the test and it was negative. However, I told Hilda Lias that we were better off rescheduling his procedure to make sure that whatever he had, it had not progressed to something else and this way he could feel better soon. I then offered them dates and they agreed on 04/26/2020 at [facility]. I told them that I would send them new paperwork and they agreed. I also told Hilda Lias that he would have to do another COVID-19 test and her son was fine with that.

## 2020-04-16 ENCOUNTER — Encounter: Admission: RE | Payer: Self-pay | Source: Home / Self Care

## 2020-04-16 ENCOUNTER — Ambulatory Visit: Admission: RE | Admit: 2020-04-16 | Payer: Self-pay | Source: Home / Self Care | Admitting: Gastroenterology

## 2020-04-16 SURGERY — COLONOSCOPY WITH PROPOFOL
Anesthesia: General

## 2020-04-18 ENCOUNTER — Ambulatory Visit: Payer: Self-pay | Admitting: Family Medicine

## 2020-04-18 ENCOUNTER — Other Ambulatory Visit: Payer: Self-pay

## 2020-04-18 VITALS — BP 130/90 | HR 72 | Wt 204.5 lb

## 2020-04-18 DIAGNOSIS — R252 Cramp and spasm: Secondary | ICD-10-CM

## 2020-04-18 DIAGNOSIS — J209 Acute bronchitis, unspecified: Secondary | ICD-10-CM

## 2020-04-18 DIAGNOSIS — J440 Chronic obstructive pulmonary disease with acute lower respiratory infection: Secondary | ICD-10-CM

## 2020-04-18 MED ORDER — ALBUTEROL SULFATE HFA 108 (90 BASE) MCG/ACT IN AERS: 2.0000 | INHALATION_SPRAY | Freq: Four times a day (QID) | RESPIRATORY_TRACT | 2 refills | Status: DC | PRN

## 2020-04-18 MED ORDER — DOXYCYCLINE HYCLATE 100 MG PO TBEC: 100.0000 mg | DELAYED_RELEASE_TABLET | Freq: Two times a day (BID) | ORAL | 0 refills | Status: AC

## 2020-04-18 MED ORDER — CETIRIZINE HCL 10 MG PO CAPS: 10.0000 mg | ORAL_CAPSULE | Freq: Every day | ORAL | 2 refills | Status: DC

## 2020-04-18 MED ORDER — PREDNISONE 10 MG (21) PO TBPK: ORAL_TABLET | ORAL | 0 refills | Status: DC

## 2020-04-18 NOTE — Patient Instructions (Signed)

## 2020-04-18 NOTE — Progress Notes (Signed)
[facility]   Progress Note: General Provider: Mike Gip, FNP  SUBJECTIVE:   Roy Norton is a 59 y.o. male who  has a past medical history of Diabetes mellitus without complication (HCC), GERD (gastroesophageal reflux disease), Hyperlipidemia, Hypertension, and Stroke (HCC).. Patient presents today for Chest Pain (+SOB, evaluated in ER last week) and URI  Patient presented to the ED on 04/10/2020 due to non productive cough, shortness of breath and body cramping. He states that he continues to have cramping that is worsened with lying down. He states that he is drinking 4-5 bottles of water per day. He has a history of tobacco use. He states that he has a home nebulizer and "a couple of inhalers". He continues to have cough that is producing thick, yellow sputum. He has been on lisinopril for several years without coughing.    Review of Systems  Constitutional: Negative.   HENT: Positive for congestion.   Respiratory: Positive for cough, sputum production and shortness of breath.   Neurological: Negative.   Psychiatric/Behavioral: Negative.      OBJECTIVE: BP 130/90   Pulse 72   Wt 204 lb 8 oz (92.8 kg)   SpO2 96%   BMI 29.34 kg/m   Wt Readings from Last 3 Encounters:  04/18/20 204 lb 8 oz (92.8 kg)  04/10/20 217 lb (98.4 kg)  03/07/20 210 lb (95.3 kg)     Physical Exam Vitals and nursing note reviewed.  Constitutional:      Appearance: He is well-developed.  HENT:     Head: Normocephalic and atraumatic.  Eyes:     Extraocular Movements: Extraocular movements intact.     Pupils: Pupils are equal, round, and reactive to light.  Cardiovascular:     Rate and Rhythm: Normal rate and regular rhythm.  Pulmonary:     Effort: Pulmonary effort is normal.     Breath sounds: Examination of the right-lower field reveals decreased breath sounds and wheezing. Examination of the left-lower field reveals decreased breath sounds and wheezing. Decreased breath  sounds and wheezing present. No rhonchi.  Musculoskeletal:        General: Normal range of motion.  Lymphadenopathy:     Cervical: No cervical adenopathy.  Neurological:     Mental Status: He is alert and oriented to person, place, and time.  Psychiatric:        Mood and Affect: Mood normal.        Behavior: Behavior normal.     ASSESSMENT/PLAN:  1. Acute bronchitis with COPD (HCC) - doxycycline (DORYX) 100 MG EC tablet; Take 1 tablet (100 mg total) by mouth 2 (two) times daily for 7 days.  Dispense: 14 tablet; Refill: 0 - Cetirizine HCl 10 MG CAPS; Take 1 capsule (10 mg total) by mouth daily.  Dispense: 30 capsule; Refill: 2 - albuterol (VENTOLIN HFA) 108 (90 Base) MCG/ACT inhaler; Inhale 2 puffs into the lungs every 6 (six) hours as needed for wheezing or shortness of breath.  Dispense: 8 g; Refill: 2 - predniSONE (STERAPRED UNI-PAK 21 TAB) 10 MG (21) TBPK tablet; Take as directed on pack.  Dispense: 21 tablet; Refill: 0  2. Leg cramping - Comprehensive metabolic panel; Future  Return in about 1 week (around 04/25/2020), or labs asap, for bronchitis f/u.    The patient was given clear instructions to go to ER or return to medical center if symptoms do not improve, worsen or new problems develop. The patient verbalized understanding and agreed with plan  of care.   Ms. Doug Sou. Nathaneil Canary, FNP-BC [place]

## 2020-04-22 ENCOUNTER — Encounter: Payer: Self-pay | Admitting: Gastroenterology

## 2020-04-22 ENCOUNTER — Other Ambulatory Visit: Payer: Self-pay

## 2020-04-23 ENCOUNTER — Telehealth: Payer: Self-pay

## 2020-04-23 ENCOUNTER — Encounter: Payer: Self-pay | Admitting: Anesthesiology

## 2020-04-23 NOTE — Telephone Encounter (Signed)
Received message from [place] at [facility] that patient will need his colonoscopy rescheduled from 04/26/20 due to bronchitis. I LVM for him to call the office back to reschedule.  Thanks,  [place], [place]

## 2020-04-24 ENCOUNTER — Other Ambulatory Visit: Payer: Self-pay | Admitting: Internal Medicine

## 2020-04-24 ENCOUNTER — Other Ambulatory Visit: Payer: Self-pay

## 2020-04-24 ENCOUNTER — Other Ambulatory Visit
Admission: RE | Admit: 2020-04-24 | Discharge: 2020-04-24 | Disposition: A | Discharge status: Home / Self Care | Payer: Self-pay | Source: Ambulatory Visit | Attending: Gastroenterology | Admitting: Gastroenterology

## 2020-04-24 DIAGNOSIS — Z01812 Encounter for preprocedural laboratory examination: Secondary | ICD-10-CM | POA: Insufficient documentation

## 2020-04-24 DIAGNOSIS — E119 Type 2 diabetes mellitus without complications: Secondary | ICD-10-CM

## 2020-04-24 DIAGNOSIS — R252 Cramp and spasm: Secondary | ICD-10-CM

## 2020-04-24 DIAGNOSIS — Z20822 Contact with and (suspected) exposure to covid-19: Secondary | ICD-10-CM | POA: Insufficient documentation

## 2020-04-24 LAB — SARS CORONAVIRUS 2 (TAT 6-24 HRS): SARS Coronavirus 2: NEGATIVE

## 2020-04-25 ENCOUNTER — Telehealth: Payer: Self-pay

## 2020-04-25 LAB — COMPREHENSIVE METABOLIC PANEL
ALT: 22 IU/L (ref 0–44)
AST: 15 IU/L (ref 0–40)
Albumin/Globulin Ratio: 1.8 (ref 1.2–2.2)
Albumin: 4.7 g/dL (ref 3.8–4.9)
Alkaline Phosphatase: 74 IU/L (ref 48–121)
BUN/Creatinine Ratio: 16 (ref 9–20)
BUN: 15 mg/dL (ref 6–24)
Bilirubin Total: 0.4 mg/dL (ref 0.0–1.2)
CO2: 23 mmol/L (ref 20–29)
Calcium: 9.8 mg/dL (ref 8.7–10.2)
Chloride: 103 mmol/L (ref 96–106)
Creatinine, Ser: 0.93 mg/dL (ref 0.76–1.27)
GFR calc Af Amer: 104 mL/min/{1.73_m2} (ref >59)
GFR calc non Af Amer: 90 mL/min/{1.73_m2} (ref >59)
Globulin, Total: 2.6 g/dL (ref 1.5–4.5)
Glucose: 95 mg/dL (ref 65–99)
Potassium: 4 mmol/L (ref 3.5–5.2)
Sodium: 141 mmol/L (ref 134–144)
Total Protein: 7.3 g/dL (ref 6.0–8.5)

## 2020-04-25 LAB — HEMOGLOBIN A1C
Est. average glucose Bld gHb Est-mCnc: 123 mg/dL
Hgb A1c MFr Bld: 5.9 % — ABNORMAL HIGH (ref 4.8–5.6)

## 2020-04-25 NOTE — Telephone Encounter (Signed)
Returned Marie's call in regards to her son's canceled colonoscopy procedure for tomorrow.  LVM apologized that his procedure was canceled, and to call back tomorrow to reschedule.  Thanks,  [place], [place]

## 2020-04-26 ENCOUNTER — Ambulatory Visit: Admit: 2020-04-26 | Payer: Self-pay | Admitting: Gastroenterology

## 2020-04-26 ENCOUNTER — Telehealth: Payer: Self-pay

## 2020-04-26 HISTORY — DX: Complete loss of teeth, unspecified cause, unspecified class: K08.109

## 2020-04-26 SURGERY — COLONOSCOPY WITH PROPOFOL
Anesthesia: Choice

## 2020-04-26 NOTE — Telephone Encounter (Signed)
Returned patient's mother's call.  LVM for her to call office back to reschedule.  Thanks,  [place], [place]

## 2020-05-07 ENCOUNTER — Ambulatory Visit: Payer: Self-pay | Admitting: Gerontology

## 2020-05-09 ENCOUNTER — Telehealth: Payer: Self-pay | Admitting: Pharmacist

## 2020-05-09 ENCOUNTER — Ambulatory Visit: Payer: Self-pay

## 2020-05-09 NOTE — Telephone Encounter (Signed)
05/09/2020 11:30:42 AM - Ventolin renewal faxed to [employer]  -- Rhetta Mura - Thursday, May 09, 2020 11:29 AM --Faxed [facility] renewal for Ventolin HFA Inhale 2 puffs every four hours as needed replaces Albuterol.

## 2020-06-05 ENCOUNTER — Other Ambulatory Visit: Payer: Self-pay

## 2020-06-05 ENCOUNTER — Ambulatory Visit: Payer: Self-pay | Admitting: Gerontology

## 2020-06-05 VITALS — BP 137/88 | HR 63 | Resp 16 | Wt 216.5 lb

## 2020-06-05 DIAGNOSIS — E119 Type 2 diabetes mellitus without complications: Secondary | ICD-10-CM

## 2020-06-05 NOTE — Progress Notes (Signed)
Established Patient Office Visit  Subjective:  Patient ID: Roy Norton, male    DOB: 1961/06/12  Age: 59 y.o. MRN: [id number]  CC: No chief complaint on file.   HPI [name] presents for follow up of hypertension, prediabetes and lab review. He states that he's compliant with his medication regimen and continues to make healthy lifestyle modifications. His HgbA1c done on 04/24/2020 decreased from 6.2 % to 5.9%. He checks his blood  Glucose once in 2 weeks and reports that it's usually less than 120 mg/dl, and he declines for it to be checked during visit. He denies hypo/hyperglycemic symptoms, and performs daily foot checks. He was treated on 04/18/2020 for Bronchitis with Antibiotics and Prednisone taper. He denies cough, shortness of breath and chest pain. Overall, he states that he's doing well and offers no further complaint.   Past Medical History:  Diagnosis Date  . Diabetes mellitus without complication (HCC)   . Edentulous   . GERD (gastroesophageal reflux disease)   . Hyperlipidemia   . Hypertension   . Stroke [facility])    No deficits    Past Surgical History:  Procedure Laterality Date  . CARDIAC CATHETERIZATION  2013    Family History  Problem Relation Age of Onset  . Hypertension Mother   . Hypertension Father   . Diabetes Father     Social History   Socioeconomic History  . Marital status: Single    Spouse name: Not on file  . Number of children: Not on file  . Years of education: Not on file  . Highest education level: 11th grade  Occupational History  . Occupation: none  Tobacco Use  . Smoking status: Former Smoker    Years: 10.00    Types: Cigarettes    Quit date: 12/18/2015    Years since quitting: 4.4  . Smokeless tobacco: Never Used  Vaping Use  . Vaping Use: Never used  Substance and Sexual Activity  . Alcohol use: No    Alcohol/week: 0.0 standard drinks  . Drug use: No  . Sexual activity: Not Currently  Other Topics Concern  .  Not on file  Social History Narrative   Rents, 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   Social Determinants of Health   Financial Resource Strain:   . Difficulty of Paying Living Expenses: Not on file  Food Insecurity:   . Worried About Programme researcher, broadcasting/film/video in the Last Year: Not on file  . Ran Out of Food in the Last Year: Not on file  Transportation Needs:   . Lack of Transportation (Medical): Not on file  . Lack of Transportation (Non-Medical): Not on file  Physical Activity:   . Days of Exercise per Week: Not on file  . Minutes of Exercise per Session: Not on file  Stress:   . Feeling of Stress : Not on file  Social Connections:   . Frequency of Communication with Friends and Family: Not on file  . Frequency of Social Gatherings with Friends and Family: Not on file  . Attends Religious Services: Not on file  . Active Member of Clubs or Organizations: Not on file  . Attends Banker Meetings: Not on file  . Marital Status: Not on file  Intimate Partner Violence:   . Fear of Current or Ex-Partner: Not on file  . Emotionally Abused: Not on file  . Physically Abused: Not on file  . Sexually Abused: Not on file  Outpatient Medications Prior to Visit  Medication Sig Dispense Refill  . albuterol (PROVENTIL) (2.5 MG/3ML) 0.083% nebulizer solution Take 3 mLs (2.5 mg total) by nebulization every 6 (six) hours as needed for wheezing or shortness of breath. 75 mL 2  . albuterol (VENTOLIN HFA) 108 (90 Base) MCG/ACT inhaler Inhale 2 puffs into the lungs every 6 (six) hours as needed for wheezing or shortness of breath. 8 g 2  . amitriptyline (ELAVIL) 50 MG tablet Take 2 tablets (100 mg total) by mouth at bedtime. 60 tablet 2  . aspirin 81 MG chewable tablet Chew 1 tablet (81 mg total) by mouth daily. 90 tablet 1  . atorvastatin (LIPITOR) 40 MG tablet Take 1 tablet (40 mg total) by mouth daily. 30 tablet 3  . butalbital-acetaminophen-caffeine (FIORICET)  50-325-40 MG tablet Take by mouth.    . Cetirizine HCl 10 MG CAPS Take 1 capsule (10 mg total) by mouth daily. 30 capsule 2  . gabapentin (NEURONTIN) 300 MG capsule TAKE ONE CAPSULE BY MOUTH 2 TIMES A DAY 60 capsule 0  . glipiZIDE (GLUCOTROL) 5 MG tablet Take 1 tablet (5 mg total) by mouth daily before breakfast. 90 tablet 1  . lisinopril (ZESTRIL) 20 MG tablet Take 1 tablet (20 mg total) by mouth daily. 30 tablet 3  . metFORMIN (GLUCOPHAGE) 500 MG tablet Take 1 tablet (500 mg total) by mouth 1 day or 1 dose. 1 daily at night 90 tablet 1  . nitroGLYCERIN (NITROSTAT) 0.4 MG SL tablet Place under the tongue.    . pantoprazole (PROTONIX) 20 MG tablet Take 1 tablet (20 mg total) by mouth daily. 30 tablet 3  . topiramate (TOPAMAX) 50 MG tablet Take by mouth.    . metoprolol tartrate (LOPRESSOR) 25 MG tablet TAKE ONE TABLET BY MOUTH 2 TIMES A DAY 60 tablet 2  . Sod Picosulfate-Mag Ox-Cit Acd (CLENPIQ PO) Take by mouth. Sample Bowel Prep provided during in office triage.    . metoprolol succinate (TOPROL-XL) 25 MG 24 hr tablet Take by mouth.    . predniSONE (STERAPRED UNI-PAK 21 TAB) 10 MG (21) TBPK tablet Take as directed on pack. (Patient not taking: Reported on 06/05/2020) 21 tablet 0   No facility-administered medications prior to visit.    No Known Allergies  ROS Review of Systems  Constitutional: Negative.   Eyes: Negative.   Respiratory: Negative.   Cardiovascular: Negative.   Endocrine: Negative.   Skin: Negative.   Neurological: Negative.   Psychiatric/Behavioral: Negative.       Objective:    Physical Exam HENT:     Head: Normocephalic and atraumatic.     Mouth/Throat:     Mouth: Mucous membranes are moist.  Eyes:     Extraocular Movements: Extraocular movements intact.     Conjunctiva/sclera: Conjunctivae normal.     Pupils: Pupils are equal, round, and reactive to light.  Cardiovascular:     Rate and Rhythm: Normal rate and regular rhythm.     Pulses: Normal pulses.      Heart sounds: Normal heart sounds.  Pulmonary:     Effort: Pulmonary effort is normal.     Breath sounds: Normal breath sounds.  Skin:    General: Skin is warm and dry.  Neurological:     General: No focal deficit present.     Mental Status: He is alert and oriented to person, place, and time. Mental status is at baseline.  Psychiatric:        Mood and Affect: Mood normal.  Behavior: Behavior normal.        Thought Content: Thought content normal.        Judgment: Judgment normal.     BP 137/88 (BP Location: Left Arm, Patient Position: Sitting, Cuff Size: Large)   Pulse 63   Resp 16   Wt 216 lb 8 oz (98.2 kg)   SpO2 97%   BMI 31.06 kg/m  Wt Readings from Last 3 Encounters:  06/05/20 216 lb 8 oz (98.2 kg)  04/18/20 204 lb 8 oz (92.8 kg)  04/10/20 217 lb (98.4 kg)   He was encouraged to lose weight  Health Maintenance Due  Topic Date Due  . Hepatitis C Screening  Never done  . HIV Screening  Never done  . TETANUS/TDAP  Never done  . COLONOSCOPY  Never done  . OPHTHALMOLOGY EXAM  09/03/2017  . INFLUENZA VACCINE  03/24/2020    There are no preventive care reminders to display for this patient.  Lab Results  Component Value Date   TSH 1.530 03/06/2020   Lab Results  Component Value Date   WBC 7.7 04/10/2020   HGB 15.2 04/10/2020   HCT 44.7 04/10/2020   MCV 91.8 04/10/2020   PLT 238 04/10/2020   Lab Results  Component Value Date   NA 141 04/24/2020   K 4.0 04/24/2020   CO2 23 04/24/2020   GLUCOSE 95 04/24/2020   BUN 15 04/24/2020   CREATININE 0.93 04/24/2020   BILITOT 0.4 04/24/2020   ALKPHOS 74 04/24/2020   AST 15 04/24/2020   ALT 22 04/24/2020   PROT 7.3 04/24/2020   ALBUMIN 4.7 04/24/2020   CALCIUM 9.8 04/24/2020   ANIONGAP 10 04/10/2020   Lab Results  Component Value Date   CHOL 114 05/17/2019   Lab Results  Component Value Date   HDL 37 (L) 05/17/2019   Lab Results  Component Value Date   LDLCALC 60 05/17/2019   Lab Results   Component Value Date   TRIG 87 05/17/2019   Lab Results  Component Value Date   CHOLHDL 3.1 05/17/2019   Lab Results  Component Value Date   HGBA1C 5.9 (H) 04/24/2020      Assessment & Plan:     1. Diabetes mellitus without complication (HCC) -His HgbA1c was 5.9%, his diabetes has improved, he will continue on current treatment regimen, low carb/non concentrated sweet diet and exercise as tolerated to lose weight. He will continue to check his blood glucose once weekly and his fasting goal should be between 80-130 mg/dl.   Follow-up: Return in about 14 weeks (around 09/11/2020), or if symptoms worsen or fail to improve.    Loveta Dellis Trellis Paganini, NP

## 2020-06-05 NOTE — Patient Instructions (Addendum)
DASH Eating Plan DASH stands for "Dietary Approaches to Stop Hypertension." The DASH eating plan is a healthy eating plan that has been shown to reduce high blood pressure (hypertension). It may also reduce your risk for type 2 diabetes, heart disease, and stroke. The DASH eating plan may also help with weight loss. What are tips for following this plan?  General guidelines  Avoid eating more than 2,300 mg (milligrams) of salt (sodium) a day. If you have hypertension, you may need to reduce your sodium intake to 1,500 mg a day.  Limit alcohol intake to no more than 1 drink a day for nonpregnant women and 2 drinks a day for men. One drink equals 12 oz of beer, 5 oz of wine, or 1 oz of hard liquor.  Work with your health care provider to maintain a healthy body weight or to lose weight. Ask what an ideal weight is for you.  Get at least 30 minutes of exercise that causes your heart to beat faster (aerobic exercise) most days of the week. Activities may include walking, swimming, or biking.  Work with your health care provider or diet and nutrition specialist (dietitian) to adjust your eating plan to your individual calorie needs. Reading food labels   Check food labels for the amount of sodium per serving. Choose foods with less than 5 percent of the Daily Value of sodium. Generally, foods with less than 300 mg of sodium per serving fit into this eating plan.  To find whole grains, look for the word "whole" as the first word in the ingredient list. Shopping  Buy products labeled as "low-sodium" or "no salt added."  Buy fresh foods. Avoid canned foods and premade or frozen meals. Cooking  Avoid adding salt when cooking. Use salt-free seasonings or herbs instead of table salt or sea salt. Check with your health care provider or pharmacist before using salt substitutes.  Do not fry foods. Cook foods using healthy methods such as baking, boiling, grilling, and broiling instead.  Cook with  heart-healthy oils, such as olive, canola, soybean, or sunflower oil. Meal planning  Eat a balanced diet that includes: ? 5 or more servings of fruits and vegetables each day. At each meal, try to fill half of your plate with fruits and vegetables. ? Up to 6-8 servings of whole grains each day. ? Less than 6 oz of lean meat, poultry, or fish each day. A 3-oz serving of meat is about the same size as a deck of cards. One egg equals 1 oz. ? 2 servings of low-fat dairy each day. ? A serving of nuts, seeds, or beans 5 times each week. ? Heart-healthy fats. Healthy fats called Omega-3 fatty acids are found in foods such as flaxseeds and coldwater fish, like sardines, salmon, and mackerel.  Limit how much you eat of the following: ? Canned or prepackaged foods. ? Food that is high in trans fat, such as fried foods. ? Food that is high in saturated fat, such as fatty meat. ? Sweets, desserts, sugary drinks, and other foods with added sugar. ? Full-fat dairy products.  Do not salt foods before eating.  Try to eat at least 2 vegetarian meals each week.  Eat more home-cooked food and less restaurant, buffet, and fast food.  When eating at a restaurant, ask that your food be prepared with less salt or no salt, if possible. What foods are recommended? The items listed may not be a complete list. Talk with your dietitian about   what dietary choices are best for you. Grains Whole-grain or whole-wheat bread. Whole-grain or whole-wheat pasta. Brown rice. Oatmeal. Quinoa. Bulgur. Whole-grain and low-sodium cereals. Pita bread. Low-fat, low-sodium crackers. Whole-wheat flour tortillas. Vegetables Fresh or frozen vegetables (raw, steamed, roasted, or grilled). Low-sodium or reduced-sodium tomato and vegetable juice. Low-sodium or reduced-sodium tomato sauce and tomato paste. Low-sodium or reduced-sodium canned vegetables. Fruits All fresh, dried, or frozen fruit. Canned fruit in natural juice (without  added sugar). Meat and other protein foods Skinless chicken or turkey. Ground chicken or turkey. Pork with fat trimmed off. Fish and seafood. Egg whites. Dried beans, peas, or lentils. Unsalted nuts, nut butters, and seeds. Unsalted canned beans. Lean cuts of beef with fat trimmed off. Low-sodium, lean deli meat. Dairy Low-fat (1%) or fat-free (skim) milk. Fat-free, low-fat, or reduced-fat cheeses. Nonfat, low-sodium ricotta or cottage cheese. Low-fat or nonfat yogurt. Low-fat, low-sodium cheese. Fats and oils Soft margarine without trans fats. Vegetable oil. Low-fat, reduced-fat, or light mayonnaise and salad dressings (reduced-sodium). Canola, safflower, olive, soybean, and sunflower oils. Avocado. Seasoning and other foods Herbs. Spices. Seasoning mixes without salt. Unsalted popcorn and pretzels. Fat-free sweets. What foods are not recommended? The items listed may not be a complete list. Talk with your dietitian about what dietary choices are best for you. Grains Baked goods made with fat, such as croissants, muffins, or some breads. Dry pasta or rice meal packs. Vegetables Creamed or fried vegetables. Vegetables in a cheese sauce. Regular canned vegetables (not low-sodium or reduced-sodium). Regular canned tomato sauce and paste (not low-sodium or reduced-sodium). Regular tomato and vegetable juice (not low-sodium or reduced-sodium). Pickles. Olives. Fruits Canned fruit in a light or heavy syrup. Fried fruit. Fruit in cream or butter sauce. Meat and other protein foods Fatty cuts of meat. Ribs. Fried meat. Bacon. Sausage. Bologna and other processed lunch meats. Salami. Fatback. Hotdogs. Bratwurst. Salted nuts and seeds. Canned beans with added salt. Canned or smoked fish. Whole eggs or egg yolks. Chicken or turkey with skin. Dairy Whole or 2% milk, cream, and half-and-half. Whole or full-fat cream cheese. Whole-fat or sweetened yogurt. Full-fat cheese. Nondairy creamers. Whipped toppings.  Processed cheese and cheese spreads. Fats and oils Butter. Stick margarine. Lard. Shortening. Ghee. Bacon fat. Tropical oils, such as coconut, palm kernel, or palm oil. Seasoning and other foods Salted popcorn and pretzels. Onion salt, garlic salt, seasoned salt, table salt, and sea salt. Worcestershire sauce. Tartar sauce. Barbecue sauce. Teriyaki sauce. Soy sauce, including reduced-sodium. Steak sauce. Canned and packaged gravies. Fish sauce. Oyster sauce. Cocktail sauce. Horseradish that you find on the shelf. Ketchup. Mustard. Meat flavorings and tenderizers. Bouillon cubes. Hot sauce and Tabasco sauce. Premade or packaged marinades. Premade or packaged taco seasonings. Relishes. Regular salad dressings. Where to find more information:  National Heart, Lung, and Blood Institute: www.nhlbi.nih.gov  American Heart Association: www.heart.org Summary  The DASH eating plan is a healthy eating plan that has been shown to reduce high blood pressure (hypertension). It may also reduce your risk for type 2 diabetes, heart disease, and stroke.  With the DASH eating plan, you should limit salt (sodium) intake to 2,300 mg a day. If you have hypertension, you may need to reduce your sodium intake to 1,500 mg a day.  When on the DASH eating plan, aim to eat more fresh fruits and vegetables, whole grains, lean proteins, low-fat dairy, and heart-healthy fats.  Work with your health care provider or diet and nutrition specialist (dietitian) to adjust your eating plan to your   individual calorie needs. This information is not intended to replace advice given to you by your health care provider. Make sure you discuss any questions you have with your health care provider. Document Revised: 07/23/2017 Document Reviewed: 08/03/2016 Elsevier Patient Education  2020 [employer]. Carbohydrate Counting for Diabetes Mellitus, Adult  Carbohydrate counting is a method of keeping track of how many carbohydrates you  eat. Eating carbohydrates naturally increases the amount of sugar (glucose) in the blood. Counting how many carbohydrates you eat helps keep your blood glucose within normal limits, which helps you manage your diabetes (diabetes mellitus). It is important to know how many carbohydrates you can safely have in each meal. This is different for every person. A diet and nutrition specialist (registered dietitian) can help you make a meal plan and calculate how many carbohydrates you should have at each meal and snack. Carbohydrates are found in the following foods:  Grains, such as breads and cereals.  Dried beans and soy products.  Starchy vegetables, such as potatoes, peas, and corn.  Fruit and fruit juices.  Milk and yogurt.  Sweets and snack foods, such as cake, cookies, candy, chips, and soft drinks. How do I count carbohydrates? There are two ways to count carbohydrates in food. You can use either of the methods or a combination of both. Reading "Nutrition Facts" on packaged food The "Nutrition Facts" list is included on the labels of almost all packaged foods and beverages in the [place] It includes:  The serving size.  Information about nutrients in each serving, including the grams (g) of carbohydrate per serving. To use the "Nutrition Facts":  Decide how many servings you will have.  Multiply the number of servings by the number of carbohydrates per serving.  The resulting number is the total amount of carbohydrates that you will be having. Learning standard serving sizes of other foods When you eat carbohydrate foods that are not packaged or do not include "Nutrition Facts" on the label, you need to measure the servings in order to count the amount of carbohydrates:  Measure the foods that you will eat with a food scale or measuring cup, if needed.  Decide how many standard-size servings you will eat.  Multiply the number of servings by 15. Most carbohydrate-rich foods have  about 15 g of carbohydrates per serving. ? For example, if you eat 8 oz (170 g) of strawberries, you will have eaten 2 servings and 30 g of carbohydrates (2 servings x 15 g = 30 g).  For foods that have more than one food mixed, such as soups and casseroles, you must count the carbohydrates in each food that is included. The following list contains standard serving sizes of common carbohydrate-rich foods. Each of these servings has about 15 g of carbohydrates:   hamburger bun or  English muffin.   oz (15 mL) syrup.   oz (14 g) jelly.  1 slice of bread.  1 six-inch tortilla.  3 oz (85 g) cooked rice or pasta.  4 oz (113 g) cooked dried beans.  4 oz (113 g) starchy vegetable, such as peas, corn, or potatoes.  4 oz (113 g) hot cereal.  4 oz (113 g) mashed potatoes or  of a large baked potato.  4 oz (113 g) canned or frozen fruit.  4 oz (120 mL) fruit juice.  4-6 crackers.  6 chicken nuggets.  6 oz (170 g) unsweetened dry cereal.  6 oz (170 g) plain fat-free yogurt or yogurt sweetened with   artificial sweeteners.  8 oz (240 mL) milk.  8 oz (170 g) fresh fruit or one small piece of fruit.  24 oz (680 g) popped popcorn. Example of carbohydrate counting Sample meal  3 oz (85 g) chicken breast.  6 oz (170 g) brown rice.  4 oz (113 g) corn.  8 oz (240 mL) milk.  8 oz (170 g) strawberries with sugar-free whipped topping. Carbohydrate calculation 1. Identify the foods that contain carbohydrates: ? Rice. ? Corn. ? Milk. ? Strawberries. 2. Calculate how many servings you have of each food: ? 2 servings rice. ? 1 serving corn. ? 1 serving milk. ? 1 serving strawberries. 3. Multiply each number of servings by 15 g: ? 2 servings rice x 15 g = 30 g. ? 1 serving corn x 15 g = 15 g. ? 1 serving milk x 15 g = 15 g. ? 1 serving strawberries x 15 g = 15 g. 4. Add together all of the amounts to find the total grams of carbohydrates eaten: ? 30 g + 15 g + 15 g + 15  g = 75 g of carbohydrates total. Summary  Carbohydrate counting is a method of keeping track of how many carbohydrates you eat.  Eating carbohydrates naturally increases the amount of sugar (glucose) in the blood.  Counting how many carbohydrates you eat helps keep your blood glucose within normal limits, which helps you manage your diabetes.  A diet and nutrition specialist (registered dietitian) can help you make a meal plan and calculate how many carbohydrates you should have at each meal and snack. This information is not intended to replace advice given to you by your health care provider. Make sure you discuss any questions you have with your health care provider. Document Revised: 03/04/2017 Document Reviewed: 01/22/2016 Elsevier Patient Education  2020 Elsevier Inc.  

## 2020-06-07 ENCOUNTER — Other Ambulatory Visit: Payer: Self-pay | Admitting: Gerontology

## 2020-06-07 DIAGNOSIS — I10 Essential (primary) hypertension: Secondary | ICD-10-CM

## 2020-06-11 ENCOUNTER — Other Ambulatory Visit: Payer: Self-pay | Admitting: Gerontology

## 2020-06-11 DIAGNOSIS — I10 Essential (primary) hypertension: Secondary | ICD-10-CM

## 2020-06-11 MED ORDER — AMLODIPINE BESYLATE 5 MG PO TABS: 5.0000 mg | ORAL_TABLET | Freq: Every day | ORAL | 3 refills | Status: DC

## 2020-06-12 ENCOUNTER — Other Ambulatory Visit: Payer: Self-pay

## 2020-06-12 ENCOUNTER — Ambulatory Visit: Payer: Self-pay | Admitting: Internal Medicine

## 2020-06-12 ENCOUNTER — Ambulatory Visit: Payer: Self-pay | Admitting: Gerontology

## 2020-06-12 DIAGNOSIS — R42 Dizziness and giddiness: Secondary | ICD-10-CM

## 2020-06-12 DIAGNOSIS — E119 Type 2 diabetes mellitus without complications: Secondary | ICD-10-CM

## 2020-06-12 DIAGNOSIS — G43009 Migraine without aura, not intractable, without status migrainosus: Secondary | ICD-10-CM

## 2020-06-12 DIAGNOSIS — I10 Essential (primary) hypertension: Secondary | ICD-10-CM

## 2020-06-12 NOTE — Progress Notes (Signed)
Established Patient Office Visit  Subjective:  Patient ID: Roy Norton, male    DOB: Jul 08, 1961  Age: 59 y.o. MRN: [id number]  CC:  Chief Complaint  Patient presents with  . Follow-up    HPI Roy Norton presents for fu. Patient reports doing well.   Past Medical History:  Diagnosis Date  . Diabetes mellitus without complication (HCC)   . Edentulous   . GERD (gastroesophageal reflux disease)   . Hyperlipidemia   . Hypertension   . Stroke [facility])    No deficits    Past Surgical History:  Procedure Laterality Date  . CARDIAC CATHETERIZATION  2013    Family History  Problem Relation Age of Onset  . Hypertension Mother   . Hypertension Father   . Diabetes Father     Social History   Socioeconomic History  . Marital status: Single    Spouse name: Not on file  . Number of children: Not on file  . Years of education: Not on file  . Highest education level: 11th grade  Occupational History  . Occupation: none  Tobacco Use  . Smoking status: Former Smoker    Years: 10.00    Types: Cigarettes    Quit date: 12/18/2015    Years since quitting: 4.4  . Smokeless tobacco: Never Used  Vaping Use  . Vaping Use: Never used  Substance and Sexual Activity  . Alcohol use: No    Alcohol/week: 0.0 standard drinks  . Drug use: No  . Sexual activity: Not Currently  Other Topics Concern  . Not on file  Social History Narrative   Rents, 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   Social Determinants of Health   Financial Resource Strain:   . Difficulty of Paying Living Expenses: Not on file  Food Insecurity:   . Worried About Programme researcher, broadcasting/film/video in the Last Year: Not on file  . Ran Out of Food in the Last Year: Not on file  Transportation Needs:   . Lack of Transportation (Medical): Not on file  . Lack of Transportation (Non-Medical): Not on file  Physical Activity:   . Days of Exercise per Week: Not on file  . Minutes of Exercise per  Session: Not on file  Stress:   . Feeling of Stress : Not on file  Social Connections:   . Frequency of Communication with Friends and Family: Not on file  . Frequency of Social Gatherings with Friends and Family: Not on file  . Attends Religious Services: Not on file  . Active Member of Clubs or Organizations: Not on file  . Attends Banker Meetings: Not on file  . Marital Status: Not on file  Intimate Partner Violence:   . Fear of Current or Ex-Partner: Not on file  . Emotionally Abused: Not on file  . Physically Abused: Not on file  . Sexually Abused: Not on file    Outpatient Medications Prior to Visit  Medication Sig Dispense Refill  . albuterol (PROVENTIL) (2.5 MG/3ML) 0.083% nebulizer solution Take 3 mLs (2.5 mg total) by nebulization every 6 (six) hours as needed for wheezing or shortness of breath. 75 mL 2  . albuterol (VENTOLIN HFA) 108 (90 Base) MCG/ACT inhaler Inhale 2 puffs into the lungs every 6 (six) hours as needed for wheezing or shortness of breath. 8 g 2  . amitriptyline (ELAVIL) 50 MG tablet Take 2 tablets (100 mg total) by mouth at bedtime. 60 tablet 2  .  amLODipine (NORVASC) 5 MG tablet Take 1 tablet (5 mg total) by mouth daily. 30 tablet 3  . aspirin 81 MG chewable tablet Chew 1 tablet (81 mg total) by mouth daily. 90 tablet 1  . atorvastatin (LIPITOR) 40 MG tablet Take 1 tablet (40 mg total) by mouth daily. 30 tablet 3  . butalbital-acetaminophen-caffeine (FIORICET) 50-325-40 MG tablet Take by mouth.    . Cetirizine HCl 10 MG CAPS Take 1 capsule (10 mg total) by mouth daily. 30 capsule 2  . gabapentin (NEURONTIN) 300 MG capsule TAKE ONE CAPSULE BY MOUTH 2 TIMES A DAY 60 capsule 0  . glipiZIDE (GLUCOTROL) 5 MG tablet Take 1 tablet (5 mg total) by mouth daily before breakfast. 90 tablet 1  . lisinopril (ZESTRIL) 20 MG tablet Take 1 tablet (20 mg total) by mouth daily. 30 tablet 3  . metFORMIN (GLUCOPHAGE) 500 MG tablet Take 1 tablet (500 mg total) by  mouth 1 day or 1 dose. 1 daily at night 90 tablet 1  . metoprolol tartrate (LOPRESSOR) 25 MG tablet TAKE ONE TABLET BY MOUTH 2 TIMES A DAY 60 tablet 2  . nitroGLYCERIN (NITROSTAT) 0.4 MG SL tablet Place under the tongue.    . pantoprazole (PROTONIX) 20 MG tablet Take 1 tablet (20 mg total) by mouth daily. 30 tablet 3  . Sod Picosulfate-Mag Ox-Cit Acd (CLENPIQ PO) Take by mouth. Sample Bowel Prep provided during in office triage.    . topiramate (TOPAMAX) 50 MG tablet Take by mouth.     No facility-administered medications prior to visit.    No Known Allergies  ROS Review of Systems    Objective:    Physical Exam  There were no vitals taken for this visit. Wt Readings from Last 3 Encounters:  06/05/20 216 lb 8 oz (98.2 kg)  04/18/20 204 lb 8 oz (92.8 kg)  04/10/20 217 lb (98.4 kg)     Health Maintenance Due  Topic Date Due  . Hepatitis C Screening  Never done  . HIV Screening  Never done  . TETANUS/TDAP  Never done  . COLONOSCOPY  Never done  . OPHTHALMOLOGY EXAM  09/03/2017  . INFLUENZA VACCINE  03/24/2020    There are no preventive care reminders to display for this patient.  Lab Results  Component Value Date   TSH 1.530 03/06/2020   Lab Results  Component Value Date   WBC 7.7 04/10/2020   HGB 15.2 04/10/2020   HCT 44.7 04/10/2020   MCV 91.8 04/10/2020   PLT 238 04/10/2020   Lab Results  Component Value Date   NA 141 04/24/2020   K 4.0 04/24/2020   CO2 23 04/24/2020   GLUCOSE 95 04/24/2020   BUN 15 04/24/2020   CREATININE 0.93 04/24/2020   BILITOT 0.4 04/24/2020   ALKPHOS 74 04/24/2020   AST 15 04/24/2020   ALT 22 04/24/2020   PROT 7.3 04/24/2020   ALBUMIN 4.7 04/24/2020   CALCIUM 9.8 04/24/2020   ANIONGAP 10 04/10/2020   Lab Results  Component Value Date   CHOL 114 05/17/2019   Lab Results  Component Value Date   HDL 37 (L) 05/17/2019   Lab Results  Component Value Date   LDLCALC 60 05/17/2019   Lab Results  Component Value Date    TRIG 87 05/17/2019   Lab Results  Component Value Date   CHOLHDL 3.1 05/17/2019   Lab Results  Component Value Date   HGBA1C 5.9 (H) 04/24/2020      Assessment & Plan:  Problem List Items Addressed This Visit      Cardiovascular and Mediastinum   Essential hypertension - Primary   Migraine     Endocrine   Diabetes mellitus without complication (HCC)     Other   Dizziness      No orders of the defined types were placed in this encounter. 1. Essential hypertension Last blood pressure 137/88, appears to be close to target. No mediation adjustments needed.   2. Migraine without aura and without status migrainosus, not intractable No history of reoccurrence of dizzy spells.  3. Chest Pain Reports no significant symptoms.   4. Diabetes mellitus without complication (HCC) Appears to be doing well. No further hypoglycemic symptoms. Last a1c 5.9. Reports sugars in the morning are usually over 100, less than 150.   Reports he need some medications renewed, reviewed the computer we found no request from [facility]. Advised he contact [facility] so that they can contact us about his renewal.   3 month fu and labs scheduled.    Follow-up: No follow-ups on file.    Rainie Crenshaw Raina Mina

## 2020-07-22 ENCOUNTER — Other Ambulatory Visit: Payer: Self-pay | Admitting: Gerontology

## 2020-07-22 DIAGNOSIS — J440 Chronic obstructive pulmonary disease with acute lower respiratory infection: Secondary | ICD-10-CM

## 2020-08-12 ENCOUNTER — Other Ambulatory Visit: Payer: Self-pay | Admitting: Gerontology

## 2020-08-12 DIAGNOSIS — I10 Essential (primary) hypertension: Secondary | ICD-10-CM

## 2020-08-12 DIAGNOSIS — E119 Type 2 diabetes mellitus without complications: Secondary | ICD-10-CM

## 2020-08-12 DIAGNOSIS — E785 Hyperlipidemia, unspecified: Secondary | ICD-10-CM

## 2020-08-14 ENCOUNTER — Telehealth: Payer: Self-pay | Admitting: Pharmacist

## 2020-08-14 NOTE — Telephone Encounter (Signed)
08/14/2020 12:11:27 PM - Ventolin refill online with [facility]  -- Rhetta Mura - Wednesday, August 14, 2020 12:10 PM --Placed refill online for Ventolin Rx # [id number], had to use patient zip code [id number]--would not accept our zip. Order# [id number].

## 2020-08-28 ENCOUNTER — Other Ambulatory Visit: Payer: Self-pay

## 2020-08-28 VITALS — BP 128/85 | HR 83 | Temp 97.9°F | Ht 70.0 in | Wt 215.0 lb

## 2020-08-28 DIAGNOSIS — I10 Essential (primary) hypertension: Secondary | ICD-10-CM

## 2020-08-29 LAB — COMPREHENSIVE METABOLIC PANEL
ALT: 25 IU/L (ref 0–44)
AST: 16 IU/L (ref 0–40)
Albumin/Globulin Ratio: 1.7 (ref 1.2–2.2)
Albumin: 4.7 g/dL (ref 3.8–4.9)
Alkaline Phosphatase: 82 IU/L (ref 44–121)
BUN/Creatinine Ratio: 14 (ref 9–20)
BUN: 13 mg/dL (ref 6–24)
Bilirubin Total: 0.4 mg/dL (ref 0.0–1.2)
CO2: 22 mmol/L (ref 20–29)
Calcium: 9.9 mg/dL (ref 8.7–10.2)
Chloride: 104 mmol/L (ref 96–106)
Creatinine, Ser: 0.92 mg/dL (ref 0.76–1.27)
GFR calc Af Amer: 106 mL/min/{1.73_m2} (ref >59)
GFR calc non Af Amer: 91 mL/min/{1.73_m2} (ref >59)
Globulin, Total: 2.7 g/dL (ref 1.5–4.5)
Glucose: 119 mg/dL — ABNORMAL HIGH (ref 65–99)
Potassium: 4.1 mmol/L (ref 3.5–5.2)
Sodium: 141 mmol/L (ref 134–144)
Total Protein: 7.4 g/dL (ref 6.0–8.5)

## 2020-08-29 LAB — CBC WITH DIFFERENTIAL/PLATELET
Basophils Absolute: 0 10*3/uL (ref 0.0–0.2)
Basos: 0 %
EOS (ABSOLUTE): 0.1 10*3/uL (ref 0.0–0.4)
Eos: 1 %
Hematocrit: 46.6 % (ref 37.5–51.0)
Hemoglobin: 16 g/dL (ref 13.0–17.7)
Immature Grans (Abs): 0 10*3/uL (ref 0.0–0.1)
Immature Granulocytes: 0 %
Lymphocytes Absolute: 2.8 10*3/uL (ref 0.7–3.1)
Lymphs: 34 %
MCH: 30.8 pg (ref 26.6–33.0)
MCHC: 34.3 g/dL (ref 31.5–35.7)
MCV: 90 fL (ref 79–97)
Monocytes Absolute: 0.4 10*3/uL (ref 0.1–0.9)
Monocytes: 5 %
Neutrophils Absolute: 4.9 10*3/uL (ref 1.4–7.0)
Neutrophils: 60 %
Platelets: 201 10*3/uL (ref 150–450)
RBC: 5.19 x10E6/uL (ref 4.14–5.80)
RDW: 13 % (ref 11.6–15.4)
WBC: 8.2 10*3/uL (ref 3.4–10.8)

## 2020-08-29 LAB — LIPID PANEL
Chol/HDL Ratio: 3.4 ratio (ref 0.0–5.0)
Cholesterol, Total: 129 mg/dL (ref 100–199)
HDL: 38 mg/dL — ABNORMAL LOW (ref >39)
LDL Chol Calc (NIH): 76 mg/dL (ref 0–99)
Triglycerides: 77 mg/dL (ref 0–149)
VLDL Cholesterol Cal: 15 mg/dL (ref 5–40)

## 2020-08-29 LAB — HEMOGLOBIN A1C
Est. average glucose Bld gHb Est-mCnc: 128 mg/dL
Hgb A1c MFr Bld: 6.1 % — ABNORMAL HIGH (ref 4.8–5.6)

## 2020-09-04 ENCOUNTER — Telehealth: Payer: Self-pay | Admitting: Internal Medicine

## 2020-09-04 ENCOUNTER — Encounter: Payer: Self-pay | Admitting: Internal Medicine

## 2020-09-04 ENCOUNTER — Other Ambulatory Visit: Payer: Self-pay

## 2020-09-04 DIAGNOSIS — E782 Mixed hyperlipidemia: Secondary | ICD-10-CM

## 2020-09-04 DIAGNOSIS — R42 Dizziness and giddiness: Secondary | ICD-10-CM

## 2020-09-04 DIAGNOSIS — I10 Essential (primary) hypertension: Secondary | ICD-10-CM

## 2020-09-04 DIAGNOSIS — E119 Type 2 diabetes mellitus without complications: Secondary | ICD-10-CM

## 2020-09-04 NOTE — Progress Notes (Signed)
Established Patient Office Visit  Subjective:  Patient ID: Roy Norton, male    DOB: 10-23-1960  Age: 60 y.o. MRN: [id number]  CC:  Chief Complaint  Patient presents with  . Follow-up    3 month fu    HPI Roy Norton presents for 3 month follow up.  Past Medical History:  Diagnosis Date  . Diabetes mellitus without complication (HCC)   . Edentulous   . GERD (gastroesophageal reflux disease)   . Hyperlipidemia   . Hypertension   . Stroke [facility])    No deficits    Past Surgical History:  Procedure Laterality Date  . CARDIAC CATHETERIZATION  2013    Family History  Problem Relation Age of Onset  . Hypertension Mother   . Hypertension Father   . Diabetes Father     Social History   Socioeconomic History  . Marital status: Single    Spouse name: Not on file  . Number of children: Not on file  . Years of education: Not on file  . Highest education level: 11th grade  Occupational History  . Occupation: none  Tobacco Use  . Smoking status: Former Smoker    Years: 10.00    Types: Cigarettes    Quit date: 12/18/2015    Years since quitting: 4.7  . Smokeless tobacco: Never Used  Vaping Use  . Vaping Use: Never used  Substance and Sexual Activity  . Alcohol use: No    Alcohol/week: 0.0 standard drinks  . Drug use: No  . Sexual activity: Not Currently  Other Topics Concern  . Not on file  Social History Narrative   Rents, 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   Social Determinants of Health   Financial Resource Strain: Not on file  Food Insecurity: Not on file  Transportation Needs: Not on file  Physical Activity: Not on file  Stress: Not on file  Social Connections: Not on file  Intimate Partner Violence: Not on file    Outpatient Medications Prior to Visit  Medication Sig Dispense Refill  . amitriptyline (ELAVIL) 50 MG tablet Take 2 tablets (100 mg total) by mouth at bedtime. 60 tablet 2  . amLODipine (NORVASC) 5 MG  tablet Take 1 tablet (5 mg total) by mouth daily. 30 tablet 3  . aspirin 81 MG chewable tablet Chew 1 tablet (81 mg total) by mouth daily. 90 tablet 1  . atorvastatin (LIPITOR) 40 MG tablet TAKE ONE TABLET BY MOUTH EVERY DAY 30 tablet 0  . butalbital-acetaminophen-caffeine (FIORICET) 50-325-40 MG tablet Take by mouth.    . Cetirizine HCl 10 MG CAPS Take 1 capsule (10 mg total) by mouth daily. 30 capsule 2  . gabapentin (NEURONTIN) 300 MG capsule TAKE ONE CAPSULE BY MOUTH 2 TIMES A DAY 60 capsule 0  . lisinopril (ZESTRIL) 20 MG tablet TAKE ONE TABLET BY MOUTH EVERY DAY 30 tablet 0  . metFORMIN (GLUCOPHAGE) 500 MG tablet Take 1 tablet (500 mg total) by mouth 1 day or 1 dose. 1 daily at night 90 tablet 1  . metoprolol tartrate (LOPRESSOR) 25 MG tablet TAKE ONE TABLET BY MOUTH 2 TIMES A DAY 60 tablet 2  . nitroGLYCERIN (NITROSTAT) 0.4 MG SL tablet Place under the tongue.    . pantoprazole (PROTONIX) 20 MG tablet Take 1 tablet (20 mg total) by mouth daily. 30 tablet 3  . Sod Picosulfate-Mag Ox-Cit Acd (CLENPIQ PO) Take by mouth. Sample Bowel Prep provided during in office triage.    Marland Kitchen  topiramate (TOPAMAX) 50 MG tablet Take by mouth.    . VENTOLIN HFA 108 (90 Base) MCG/ACT inhaler INHALE 2 PUFFS INTO THE LUNGS EVERY 6 HOURS AS NEEDED FOR WHEEZING OR SHORTNESS OF BREATH 6.7 g 0  . glipiZIDE (GLUCOTROL) 5 MG tablet Take 1 tablet (5 mg total) by mouth daily before breakfast. 90 tablet 1   No facility-administered medications prior to visit.    No Known Allergies  ROS Review of Systems    Objective:    Physical Exam  There were no vitals taken for this visit. Wt Readings from Last 3 Encounters:  08/28/20 215 lb (97.5 kg)  06/05/20 216 lb 8 oz (98.2 kg)  04/18/20 204 lb 8 oz (92.8 kg)     Health Maintenance Due  Topic Date Due  . Hepatitis C Screening  Never done  . HIV Screening  Never done  . TETANUS/TDAP  Never done  . COLONOSCOPY (Pts 45-33yrs Insurance coverage will need to be  confirmed)  Never done  . OPHTHALMOLOGY EXAM  09/03/2017  . INFLUENZA VACCINE  03/24/2020    There are no preventive care reminders to display for this patient.  Lab Results  Component Value Date   TSH 1.530 03/06/2020   Lab Results  Component Value Date   WBC 8.2 08/28/2020   HGB 16.0 08/28/2020   HCT 46.6 08/28/2020   MCV 90 08/28/2020   PLT 201 08/28/2020   Lab Results  Component Value Date   NA 141 08/28/2020   K 4.1 08/28/2020   CO2 22 08/28/2020   GLUCOSE 119 (H) 08/28/2020   BUN 13 08/28/2020   CREATININE 0.92 08/28/2020   BILITOT 0.4 08/28/2020   ALKPHOS 82 08/28/2020   AST 16 08/28/2020   ALT 25 08/28/2020   PROT 7.4 08/28/2020   ALBUMIN 4.7 08/28/2020   CALCIUM 9.9 08/28/2020   ANIONGAP 10 04/10/2020   Lab Results  Component Value Date   CHOL 129 08/28/2020   Lab Results  Component Value Date   HDL 38 (L) 08/28/2020   Lab Results  Component Value Date   LDLCALC 76 08/28/2020   Lab Results  Component Value Date   TRIG 77 08/28/2020   Lab Results  Component Value Date   CHOLHDL 3.4 08/28/2020   Lab Results  Component Value Date   HGBA1C 6.1 (H) 08/28/2020      Assessment & Plan:   Problem List Items Addressed This Visit      Cardiovascular and Mediastinum   Essential hypertension - Primary     Endocrine   Diabetes mellitus without complication (HCC)     Other   Hyperlipidemia   Dizziness      No orders of the defined types were placed in this encounter. 1. Essential hypertension BP at target.   2. Diabetes mellitus without complication (HCC) No further syncope spells since reduction of his diabetic medication dose. Recent A1C 6.1. Will discontinue his Glipizide entirely.   FU in 3 months.    Follow-up: Return in about 13 weeks (around 12/04/2020).    Osmar Howton Raina Mina

## 2020-09-09 ENCOUNTER — Ambulatory Visit: Payer: Self-pay | Admitting: Pharmacist

## 2020-09-09 DIAGNOSIS — Z79899 Other long term (current) drug therapy: Secondary | ICD-10-CM

## 2020-09-10 ENCOUNTER — Encounter: Payer: Self-pay | Admitting: Pharmacist

## 2020-09-10 ENCOUNTER — Other Ambulatory Visit: Payer: Self-pay

## 2020-09-10 NOTE — Progress Notes (Signed)
Medication Management Clinic Visit Note  Patient: Roy Norton MRN: [id number] Date of Birth: 09/14/1960 PCP: Virl Axe, MD   Roy Norton 60 y.o. male presents for a telephone visit for medication management today. Verified patient with two identifiers.  There were no vitals taken for this visit.  Patient Information   Past Medical History:  Diagnosis Date  . Diabetes mellitus without complication (HCC)   . Edentulous   . GERD (gastroesophageal reflux disease)   . Hyperlipidemia   . Hypertension   . Stroke [facility])    No deficits      Past Surgical History:  Procedure Laterality Date  . CARDIAC CATHETERIZATION  2013     Family History  Problem Relation Age of Onset  . Hypertension Mother   . Hypertension Father   . Diabetes Father     Family Support: Good  Lifestyle Diet: Breakfast: cereal (no milk) Lunch: soup Dinner: ranges - "whatever mom cooks" Drinks: diet coke, water    Current Exercise Habits: Home exercise routine, Type of exercise: walking, Time (Minutes): 15, Frequency (Times/Week): 3 (twice per day on those days), Weekly Exercise (Minutes/Week): 45, Intensity: Moderate       Social History   Substance and Sexual Activity  Alcohol Use No  . Alcohol/week: 0.0 standard drinks      Social History   Tobacco Use  Smoking Status Current Some Day Smoker  . Packs/day: 1.00  . Years: 10.00  . Pack years: 10.00  . Types: Cigarettes  . Last attempt to quit: 12/18/2015  . Years since quitting: 4.7  Smokeless Tobacco Never Used  Tobacco Comment   Trying to quit again      Health Maintenance  Topic Date Due  . Hepatitis C Screening  Never done  . HIV Screening  Never done  . TETANUS/TDAP  Never done  . COLONOSCOPY (Pts 45-35yrs Insurance coverage will need to be confirmed)  Never done  . OPHTHALMOLOGY EXAM  09/03/2017  . INFLUENZA VACCINE  03/24/2020  . COVID-19 Vaccine (3 - Booster for Moderna series) 09/22/2020  . HEMOGLOBIN  A1C  02/25/2021  . FOOT EXAM  03/07/2021  . PNEUMOCOCCAL POLYSACCHARIDE VACCINE AGE 10-64 HIGH RISK  Completed   Health Maintenance/Date Completed  Last ED visit: 04/11/2020 Last Visit to PCP: 09/04/2020 Next Visit to PCP: 12/04/2020 Dental Exam: edentulous Prostate Exam: none Colonoscopy: was supposed to get one but got sick Flu Vaccine: 03/24/2020 Pneumonia Vaccine: completed COVID-19 Vaccine: completed primary series  Outpatient Encounter Medications as of 09/09/2020  Medication Sig  . amitriptyline (ELAVIL) 50 MG tablet Take 2 tablets (100 mg total) by mouth at bedtime.  Marland Kitchen amLODipine (NORVASC) 5 MG tablet Take 1 tablet (5 mg total) by mouth daily.  Marland Kitchen aspirin 81 MG chewable tablet Chew 1 tablet (81 mg total) by mouth daily.  Marland Kitchen atorvastatin (LIPITOR) 40 MG tablet TAKE ONE TABLET BY MOUTH EVERY DAY  . butalbital-acetaminophen-caffeine (FIORICET) 50-325-40 MG tablet Take 1 tablet by mouth as needed for headache.  . Cetirizine HCl 10 MG CAPS Take 1 capsule (10 mg total) by mouth daily.  Marland Kitchen gabapentin (NEURONTIN) 300 MG capsule TAKE ONE CAPSULE BY MOUTH 2 TIMES A DAY  . lisinopril (ZESTRIL) 20 MG tablet TAKE ONE TABLET BY MOUTH EVERY DAY  . metFORMIN (GLUCOPHAGE) 500 MG tablet Take 1 tablet (500 mg total) by mouth 1 day or 1 dose. 1 daily at night  . metoprolol tartrate (LOPRESSOR) 25 MG tablet TAKE ONE TABLET BY MOUTH 2 TIMES  A DAY  . nitroGLYCERIN (NITROSTAT) 0.4 MG SL tablet Place 0.4 mg under the tongue every 5 (five) minutes as needed for chest pain.  . pantoprazole (PROTONIX) 20 MG tablet Take 1 tablet (20 mg total) by mouth daily.  Marland Kitchen topiramate (TOPAMAX) 50 MG tablet Take 50 mg by mouth 2 (two) times daily.  . VENTOLIN HFA 108 (90 Base) MCG/ACT inhaler INHALE 2 PUFFS INTO THE LUNGS EVERY 6 HOURS AS NEEDED FOR WHEEZING OR SHORTNESS OF BREATH  . Sod Picosulfate-Mag Ox-Cit Acd (CLENPIQ PO) Take by mouth. Sample Bowel Prep provided during in office triage. (Patient not taking: Reported on  09/10/2020)   No facility-administered encounter medications on file as of 09/09/2020.     Assessment and Plan: Diabetes Patient currently takes metformin for his diabetes. He was previously also taking glipizide however this was discontinued by his PCP during his visit on 09/04/2020 due to hypoglycemia and his A1c being at goal <7% (08/28/2020 6.1%). Per pt, the doctor told him he does not really need to check his BG every day; pt reports checking his BG a couple times per week but was unable to recall a value. He has not had any recent episodes of hypoglycemia since his medication change but knows what to do if he has one. Difficult to gauge his exact diet, however based on his labs it seems the pt is eating appropriate foods. Pt states he inspects his feet multiple times per day. He is able to exercise (walk) two times per day a few times per week but does have some knee pain. He was supposed to get check out at [facility] and possibly get surgery but he has not had time to go there. Encouraged pt to continue his diet and exercise and to f/u with [facility] in regards to his knee.   HTN Pt currently takes amlodipine, lisinopril, and metoprolol for his BP. He reports checking his BP 3x/wk however could not remember a value. He stated that at his appt last week the doctor said it was "good". Encouraged pt to continue diet and exercise as well as checking his BP and to keep a BP log to bring to his future appointments.   HLD Pt takes atorvastatin for his HLD. His last LDL 08/28/2020 76. Encouraged pt to continue diet and exercise. Pt did not have any concerns at this time. Continue current regimen.   GERD Pt takes pantoprazole and did not have any concerns at this time. Continue current regimen.   Migraines Pt takes Fioricet and topiramate for his migraines. He states that he gets a HA maybe once every 3 months and that when he does take the Fioricet it resolves his migraine. Pt did not have any other  concerns at this time. Encouraged pt to f/u with PCP if symptoms worsen.   Allergies Pt takes Ventolin and cetirizine for his allergies. He did state he had to use his inhaler yesterday however his symptoms resolved after use of the inhaler. He does not otherwise have to use it very often and does not have any other concerns at this time. Continue current regimen.    Access/Adherence Pt states that he uses a pill box to remain adherent. He did not need any refills at this time.   Raiford Noble, PharmD Pharmacy Resident  09/10/2020 10:28 AM

## 2020-10-07 ENCOUNTER — Other Ambulatory Visit: Payer: Self-pay | Admitting: Gerontology

## 2020-10-07 DIAGNOSIS — E1169 Type 2 diabetes mellitus with other specified complication: Secondary | ICD-10-CM

## 2020-10-07 DIAGNOSIS — K219 Gastro-esophageal reflux disease without esophagitis: Secondary | ICD-10-CM

## 2020-10-07 DIAGNOSIS — I10 Essential (primary) hypertension: Secondary | ICD-10-CM

## 2020-10-08 ENCOUNTER — Other Ambulatory Visit: Payer: Self-pay | Admitting: Gerontology

## 2020-10-08 DIAGNOSIS — I10 Essential (primary) hypertension: Secondary | ICD-10-CM

## 2020-10-08 DIAGNOSIS — E785 Hyperlipidemia, unspecified: Secondary | ICD-10-CM

## 2020-10-08 DIAGNOSIS — E1169 Type 2 diabetes mellitus with other specified complication: Secondary | ICD-10-CM

## 2020-10-08 DIAGNOSIS — K219 Gastro-esophageal reflux disease without esophagitis: Secondary | ICD-10-CM

## 2020-10-08 DIAGNOSIS — E119 Type 2 diabetes mellitus without complications: Secondary | ICD-10-CM

## 2020-10-08 MED ORDER — LISINOPRIL 20 MG PO TABS: 20.0000 mg | ORAL_TABLET | Freq: Every day | ORAL | 1 refills | Status: DC

## 2020-10-08 MED ORDER — METFORMIN HCL 500 MG PO TABS
500.0000 mg | ORAL_TABLET | ORAL | 0 refills | Status: DC
Start: 2020-10-08 — End: 2020-10-08

## 2020-10-08 MED ORDER — GABAPENTIN 300 MG PO CAPS
ORAL_CAPSULE | ORAL | 1 refills | Status: DC
Start: 2020-10-08 — End: 2020-10-08

## 2020-10-08 MED ORDER — AMLODIPINE BESYLATE 5 MG PO TABS: 5.0000 mg | ORAL_TABLET | Freq: Every day | ORAL | 1 refills | Status: DC

## 2020-10-08 MED ORDER — PANTOPRAZOLE SODIUM 20 MG PO TBEC: 20.0000 mg | DELAYED_RELEASE_TABLET | Freq: Every day | ORAL | 1 refills | Status: DC

## 2020-10-08 MED ORDER — ATORVASTATIN CALCIUM 40 MG PO TABS: 40.0000 mg | ORAL_TABLET | Freq: Every day | ORAL | 1 refills | Status: DC

## 2020-10-30 ENCOUNTER — Telehealth: Payer: Self-pay | Admitting: Pharmacist

## 2020-10-30 NOTE — Telephone Encounter (Signed)
10/30/2020 12:14:16 PM - [employer] forms to patient & dr  -- Roy Norton - Wednesday, October 30, 2020 12:12 PM --Mailing [employer] application for ProAir HFA to patient to sign & return with financials, also reminder to return completed Recertification packet previously mailed, sending provider portion to [facility]  ProAir HFA replaces Ventolin HFA.

## 2020-11-06 ENCOUNTER — Telehealth: Payer: Self-pay | Admitting: Pharmacist

## 2020-11-06 NOTE — Telephone Encounter (Signed)
11/06/2020 12:21:43 PM - ProAir pending & pt [facility] elig  -- Rhetta Mura - Wednesday, November 06, 2020 12:20 PM -- I have received the signed provider portion of [employer] application for Pro[employer]--holding for patient to return his portion & Recert--mailed to patient 10/30/2020 with financials.  Recert was previously mailed to patient.  Patient [facility] elig. Ends 12/22/2020.

## 2020-11-12 ENCOUNTER — Other Ambulatory Visit: Payer: Self-pay | Admitting: Gerontology

## 2020-11-12 DIAGNOSIS — I10 Essential (primary) hypertension: Secondary | ICD-10-CM

## 2020-11-13 ENCOUNTER — Other Ambulatory Visit: Payer: Self-pay

## 2020-11-13 ENCOUNTER — Other Ambulatory Visit: Payer: Self-pay | Admitting: Internal Medicine

## 2020-11-13 VITALS — BP 154/89 | HR 66 | Temp 97.1°F | Ht 70.0 in | Wt 212.3 lb

## 2020-11-13 DIAGNOSIS — I10 Essential (primary) hypertension: Secondary | ICD-10-CM

## 2020-11-13 NOTE — Progress Notes (Unsigned)
cbc

## 2020-11-14 LAB — COMPREHENSIVE METABOLIC PANEL
ALT: 29 IU/L (ref 0–44)
AST: 23 IU/L (ref 0–40)
Albumin/Globulin Ratio: 1.5 (ref 1.2–2.2)
Albumin: 4.5 g/dL (ref 3.8–4.9)
Alkaline Phosphatase: 84 IU/L (ref 44–121)
BUN/Creatinine Ratio: 16 (ref 9–20)
BUN: 13 mg/dL (ref 6–24)
Bilirubin Total: 0.4 mg/dL (ref 0.0–1.2)
CO2: 21 mmol/L (ref 20–29)
Calcium: 9.5 mg/dL (ref 8.7–10.2)
Chloride: 103 mmol/L (ref 96–106)
Creatinine, Ser: 0.83 mg/dL (ref 0.76–1.27)
Globulin, Total: 3 g/dL (ref 1.5–4.5)
Glucose: 115 mg/dL — ABNORMAL HIGH (ref 65–99)
Potassium: 4.1 mmol/L (ref 3.5–5.2)
Sodium: 139 mmol/L (ref 134–144)
Total Protein: 7.5 g/dL (ref 6.0–8.5)
eGFR: 101 mL/min/{1.73_m2} (ref >59)

## 2020-11-14 LAB — CBC WITH DIFFERENTIAL/PLATELET
Basophils Absolute: 0 10*3/uL (ref 0.0–0.2)
Basos: 1 %
EOS (ABSOLUTE): 0.1 10*3/uL (ref 0.0–0.4)
Eos: 2 %
Hematocrit: 45.9 % (ref 37.5–51.0)
Hemoglobin: 15.4 g/dL (ref 13.0–17.7)
Immature Grans (Abs): 0 10*3/uL (ref 0.0–0.1)
Immature Granulocytes: 0 %
Lymphocytes Absolute: 2.2 10*3/uL (ref 0.7–3.1)
Lymphs: 37 %
MCH: 30.7 pg (ref 26.6–33.0)
MCHC: 33.6 g/dL (ref 31.5–35.7)
MCV: 92 fL (ref 79–97)
Monocytes Absolute: 0.4 10*3/uL (ref 0.1–0.9)
Monocytes: 7 %
Neutrophils Absolute: 3.1 10*3/uL (ref 1.4–7.0)
Neutrophils: 53 %
Platelets: 183 10*3/uL (ref 150–450)
RBC: 5.01 x10E6/uL (ref 4.14–5.80)
RDW: 13.6 % (ref 11.6–15.4)
WBC: 5.8 10*3/uL (ref 3.4–10.8)

## 2020-11-14 LAB — HEMOGLOBIN A1C
Est. average glucose Bld gHb Est-mCnc: 126 mg/dL
Hgb A1c MFr Bld: 6 % — ABNORMAL HIGH (ref 4.8–5.6)

## 2020-11-22 ENCOUNTER — Telehealth: Payer: Self-pay | Admitting: Pharmacist

## 2020-11-22 NOTE — Telephone Encounter (Signed)
11/22/2020 8:14:48 AM - [employer] form back to provider  -- Rhetta Mura - Friday, November 22, 2020 8:12 AM --Received faxed notice from [employer], the provider signature-Elizabeth does not match the provider on application-Chaplin. I have reprinted that page and putting in [facility] folder for Chaplin to sign and return, THEN I WILL FAX TO [employer], PATIENT CASE# [id number].

## 2020-11-27 ENCOUNTER — Other Ambulatory Visit: Payer: Self-pay

## 2020-11-28 ENCOUNTER — Other Ambulatory Visit: Payer: Self-pay

## 2020-11-28 ENCOUNTER — Ambulatory Visit: Payer: Self-pay | Admitting: Gerontology

## 2020-11-28 VITALS — BP 116/80 | HR 76 | Temp 97.3°F | Resp 18 | Ht 69.0 in | Wt 211.0 lb

## 2020-11-28 DIAGNOSIS — E119 Type 2 diabetes mellitus without complications: Secondary | ICD-10-CM

## 2020-11-28 DIAGNOSIS — I10 Essential (primary) hypertension: Secondary | ICD-10-CM

## 2020-11-28 DIAGNOSIS — R229 Localized swelling, mass and lump, unspecified: Secondary | ICD-10-CM

## 2020-11-28 MED ORDER — LISINOPRIL 20 MG PO TABS
ORAL_TABLET | Freq: Every day | ORAL | 2 refills | Status: DC
  Filled 2020-11-28: qty 30, 30d supply, fill #0
  Filled 2021-01-21: qty 30, 30d supply, fill #1
  Filled 2021-02-25: qty 30, 30d supply, fill #2

## 2020-11-28 MED ORDER — NITROGLYCERIN 0.4 MG SL SUBL
0.4000 mg | SUBLINGUAL_TABLET | SUBLINGUAL | 1 refills | Status: DC | PRN
  Filled 2020-11-28: qty 25, 8d supply, fill #0
  Filled 2021-03-28: qty 25, 25d supply, fill #1
  Filled 2021-05-26: qty 25, 25d supply, fill #2

## 2020-11-28 NOTE — Progress Notes (Signed)
Established Patient Office Visit  Subjective:  Patient ID: Roy Norton, male    DOB: 1961/03/11  Age: 60 y.o. MRN: [id number]  CC: No chief complaint on file.   HPI Roy Norton presents for evaluation of a "knot " to his perianal area, medication refill and lab review. He noticed the knot about 2 weeks ago while bathing. Denies any swelling, erythema or drainage. He denies pain currently,  But he stated that he experiences brief intermittent pain that resolves almost immediately without any intervention.   Labs from 11/13/20 ago were reviewed, Hgb A1c was 6.0 and the the rest of the lab were unremarkable. Overall, he states that he is doing well and offers no further complaints.  Past Medical History:  Diagnosis Date  . Diabetes mellitus without complication (HCC)   . Edentulous   . GERD (gastroesophageal reflux disease)   . Hyperlipidemia   . Hypertension   . Stroke [facility])    No deficits    Past Surgical History:  Procedure Laterality Date  . CARDIAC CATHETERIZATION  2013    Family History  Problem Relation Age of Onset  . Hypertension Mother   . Hypertension Father   . Diabetes Father     Social History   Socioeconomic History  . Marital status: Single    Spouse name: Not on file  . Number of children: Not on file  . Years of education: Not on file  . Highest education level: 11th grade  Occupational History  . Occupation: none  Tobacco Use  . Smoking status: Current Some Day Smoker    Packs/day: 1.00    Years: 10.00    Pack years: 10.00    Types: Cigarettes    Last attempt to quit: 12/18/2015    Years since quitting: 4.9  . Smokeless tobacco: Never Used  . Tobacco comment: Trying to quit again  Vaping Use  . Vaping Use: Never used  Substance and Sexual Activity  . Alcohol use: No    Alcohol/week: 0.0 standard drinks  . Drug use: No  . Sexual activity: Not Currently  Other Topics Concern  . Not on file  Social History Narrative   Rents,  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   Social Determinants of Health   Financial Resource Strain: Not on file  Food Insecurity: Not on file  Transportation Needs: Not on file  Physical Activity: Not on file  Stress: Not on file  Social Connections: Not on file  Intimate Partner Violence: Not on file    Outpatient Medications Prior to Visit  Medication Sig Dispense Refill  . amitriptyline (ELAVIL) 50 MG tablet Take 2 tablets (100 mg total) by mouth at bedtime. 60 tablet 2  . amLODipine (NORVASC) 5 MG tablet TAKE ONE TABLET BY MOUTH EVERY DAY 30 tablet 1  . aspirin 81 MG chewable tablet Chew 1 tablet (81 mg total) by mouth daily. 90 tablet 1  . atorvastatin (LIPITOR) 40 MG tablet TAKE ONE TABLET BY MOUTH EVERY DAY 30 tablet 1  . butalbital-acetaminophen-caffeine (FIORICET) 50-325-40 MG tablet Take 1 tablet by mouth as needed for headache.    . Cetirizine HCl 10 MG CAPS Take 1 capsule (10 mg total) by mouth daily. 30 capsule 2  . gabapentin (NEURONTIN) 300 MG capsule TAKE ONE CAPSULE BY MOUTH 2 TIMES A DAY 60 capsule 1  . metFORMIN (GLUCOPHAGE) 500 MG tablet TAKE ONE TABLET BY MOUTH ONCE DAILY AT NIGHT 90 tablet 0  .  metoprolol tartrate (LOPRESSOR) 25 MG tablet TAKE ONE TABLET BY MOUTH 2 TIMES A DAY 180 tablet 4  . pantoprazole (PROTONIX) 20 MG tablet TAKE 1 TABLET (20 MG TOTAL) BY MOUTH DAILY. 30 tablet 1  . topiramate (TOPAMAX) 50 MG tablet Take 50 mg by mouth 2 (two) times daily.    . VENTOLIN HFA 108 (90 Base) MCG/ACT inhaler INHALE 2 PUFFS EVERY 4 HOURS AS NEEDED. REPLACES ALBUTEROL. 54 g 3  . lisinopril (ZESTRIL) 20 MG tablet TAKE ONE TABLET BY MOUTH EVERY DAY 30 tablet 1  . VENTOLIN HFA 108 (90 Base) MCG/ACT inhaler INHALE 2 PUFFS INTO THE LUNGS EVERY 6 HOURS AS NEEDED FOR WHEEZING OR SHORTNESS OF BREATH 6.7 g 0  . metoprolol tartrate (LOPRESSOR) 25 MG tablet TAKE ONE TABLET BY MOUTH 2 TIMES A DAY 30 tablet 0  . nitroGLYCERIN (NITROSTAT) 0.4 MG SL tablet Place  0.4 mg under the tongue every 5 (five) minutes as needed for chest pain.    . Sod Picosulfate-Mag Ox-Cit Acd (CLENPIQ PO) Take by mouth. Sample Bowel Prep provided during in office triage. (Patient not taking: No sig reported)     No facility-administered medications prior to visit.    No Known Allergies  ROS Review of Systems  Constitutional: Negative.   Respiratory: Negative.   Cardiovascular: Negative.   Gastrointestinal: Negative.   Genitourinary:       Pea size knot at the perineum.  Neurological: Negative.   Psychiatric/Behavioral: Negative.       Objective:    Physical Exam Constitutional:      Appearance: Normal appearance.  HENT:     Head: Normocephalic.  Cardiovascular:     Rate and Rhythm: Normal rate and regular rhythm.     Pulses: Normal pulses.     Heart sounds: Normal heart sounds.  Pulmonary:     Effort: Pulmonary effort is normal.     Breath sounds: Normal breath sounds.  Abdominal:     General: Bowel sounds are normal.  Genitourinary:    Penis: Normal.      Testes: Normal.    Skin:    General: Skin is warm and dry.     Capillary Refill: Capillary refill takes less than 2 seconds.  Neurological:     Mental Status: He is alert and oriented to person, place, and time.  Psychiatric:        Mood and Affect: Mood normal.     BP 116/80 (Patient Position: Sitting, Cuff Size: Normal)   Pulse 76   Temp (!) 97.3 F (36.3 C) (Temporal)   Resp 18   Ht 5\' 9"  (1.753 m)   Wt 211 lb (95.7 kg)   SpO2 94%   BMI 31.16 kg/m  Wt Readings from Last 3 Encounters:  11/28/20 211 lb (95.7 kg)  11/13/20 212 lb 4.6 oz (96.3 kg)  08/28/20 215 lb (97.5 kg)  Weight loss encouraged.   Health Maintenance Due  Topic Date Due  . Hepatitis C Screening  Never done  . HIV Screening  Never done  . TETANUS/TDAP  Never done  . COLONOSCOPY (Pts 45-69yrs Insurance coverage will need to be confirmed)  Never done  . OPHTHALMOLOGY EXAM  09/03/2017  . COVID-19 Vaccine (3  - Booster for Moderna series) 09/22/2020    There are no preventive care reminders to display for this patient.  Lab Results  Component Value Date   TSH 1.530 03/06/2020   Lab Results  Component Value Date   WBC 5.8 11/13/2020  HGB 15.4 11/13/2020   HCT 45.9 11/13/2020   MCV 92 11/13/2020   PLT 183 11/13/2020   Lab Results  Component Value Date   NA 139 11/13/2020   K 4.1 11/13/2020   CO2 21 11/13/2020   GLUCOSE 115 (H) 11/13/2020   BUN 13 11/13/2020   CREATININE 0.83 11/13/2020   BILITOT 0.4 11/13/2020   ALKPHOS 84 11/13/2020   AST 23 11/13/2020   ALT 29 11/13/2020   PROT 7.5 11/13/2020   ALBUMIN 4.5 11/13/2020   CALCIUM 9.5 11/13/2020   ANIONGAP 10 04/10/2020   Lab Results  Component Value Date   CHOL 129 08/28/2020   Lab Results  Component Value Date   HDL 38 (L) 08/28/2020   Lab Results  Component Value Date   LDLCALC 76 08/28/2020   Lab Results  Component Value Date   TRIG 77 08/28/2020   Lab Results  Component Value Date   CHOLHDL 3.4 08/28/2020   Lab Results  Component Value Date   HGBA1C 6.0 (H) 11/13/2020      Assessment & Plan:   1. Perineal lump A small pea sized knot noted to the perineum, no swelling, redness or drainage noted. Pt to follow up in 3 months. Advised to monitor site and return with any changes to knot, increase in size, pain or any other concerns.      2. Essential hypertension Blood pressure today is 116/80. Continue current medication regimen for blood pressure.    - nitroGLYCERIN (NITROSTAT) 0.4 MG SL tablet; Place 1 tablet (0.4 mg total) under the tongue every 5 (five) minutes as needed for chest pain.  Dispense: 30 tablet; Refill: 1 - lisinopril (ZESTRIL) 20 MG tablet; TAKE ONE TABLET BY MOUTH EVERY DAY  Dispense: 30 tablet; Refill: 2    3. Diabetes mellitus without complication (HCC) Hgb A1c from 11/13/20 was 6.0, his diabetes has improved. He will continue current treatment regimen, Low carb and non  concentrated sweets.    He was encouraged to exercise as tolerated. Fasting goal for blood glucose should be 80-130 mg/dl and encouraged to continue to monitor blood glucose once a week.    Follow-up: Return in about 3 months (around 02/27/2021), or if symptoms worsen or fail to improve.    Clance Boll, RN

## 2020-11-29 ENCOUNTER — Other Ambulatory Visit: Payer: Self-pay

## 2020-12-03 ENCOUNTER — Other Ambulatory Visit: Payer: Self-pay

## 2020-12-04 ENCOUNTER — Ambulatory Visit: Payer: Self-pay | Admitting: Internal Medicine

## 2020-12-04 ENCOUNTER — Encounter: Payer: Self-pay | Admitting: Internal Medicine

## 2020-12-04 ENCOUNTER — Other Ambulatory Visit: Payer: Self-pay

## 2020-12-04 VITALS — BP 126/85 | HR 89 | Temp 97.1°F | Ht 70.0 in | Wt 209.3 lb

## 2020-12-04 DIAGNOSIS — R229 Localized swelling, mass and lump, unspecified: Secondary | ICD-10-CM

## 2020-12-04 DIAGNOSIS — I10 Essential (primary) hypertension: Secondary | ICD-10-CM

## 2020-12-04 NOTE — Progress Notes (Signed)
Established Patient Office Visit  Subjective:  Patient ID: Roy Norton, male    DOB: 09/27/1960  Age: 60 y.o. MRN: [id number]  CC:  Chief Complaint  Patient presents with  . Follow-up  . Perineal lump    Fu to evaluate lump    HPI [name] presents for fu of perineal lump.  Past Medical History:  Diagnosis Date  . Diabetes mellitus without complication (HCC)   . Edentulous   . GERD (gastroesophageal reflux disease)   . Hyperlipidemia   . Hypertension   . Stroke [facility]    No deficits    Past Surgical History:  Procedure Laterality Date  . CARDIAC CATHETERIZATION  2013    Family History  Problem Relation Age of Onset  . Hypertension Mother   . Hypertension Father   . Diabetes Father     Social History   Socioeconomic History  . Marital status: Single    Spouse name: Not on file  . Number of children: Not on file  . Years of education: Not on file  . Highest education level: 11th grade  Occupational History  . Occupation: none  Tobacco Use  . Smoking status: Current Some Day Smoker    Packs/day: 1.00    Years: 10.00    Pack years: 10.00    Types: Cigarettes    Last attempt to quit: 12/18/2015    Years since quitting: 4.9  . Smokeless tobacco: Never Used  . Tobacco comment: Trying to quit again  Vaping Use  . Vaping Use: Never used  Substance and Sexual Activity  . Alcohol use: No    Alcohol/week: 0.0 standard drinks  . Drug use: No  . Sexual activity: Not Currently  Other Topics Concern  . Not on file  Social History Narrative   Rents, 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   Social Determinants of Health   Financial Resource Strain: Not on file  Food Insecurity: Not on file  Transportation Needs: Not on file  Physical Activity: Not on file  Stress: Not on file  Social Connections: Not on file  Intimate Partner Violence: Not on file    Outpatient Medications Prior to Visit  Medication Sig Dispense Refill   . amitriptyline (ELAVIL) 50 MG tablet Take 2 tablets (100 mg total) by mouth at bedtime. 60 tablet 2  . amLODipine (NORVASC) 5 MG tablet TAKE ONE TABLET BY MOUTH EVERY DAY 30 tablet 1  . aspirin 81 MG chewable tablet Chew 1 tablet (81 mg total) by mouth daily. 90 tablet 1  . atorvastatin (LIPITOR) 40 MG tablet TAKE ONE TABLET BY MOUTH EVERY DAY 30 tablet 1  . butalbital-acetaminophen-caffeine (FIORICET) 50-325-40 MG tablet Take 1 tablet by mouth as needed for headache.    . Cetirizine HCl 10 MG CAPS Take 1 capsule (10 mg total) by mouth daily. 30 capsule 2  . gabapentin (NEURONTIN) 300 MG capsule TAKE ONE CAPSULE BY MOUTH 2 TIMES A DAY 60 capsule 1  . lisinopril (ZESTRIL) 20 MG tablet TAKE ONE TABLET BY MOUTH EVERY DAY 30 tablet 2  . metFORMIN (GLUCOPHAGE) 500 MG tablet TAKE ONE TABLET BY MOUTH ONCE DAILY AT NIGHT 90 tablet 0  . metoprolol tartrate (LOPRESSOR) 25 MG tablet TAKE ONE TABLET BY MOUTH 2 TIMES A DAY 180 tablet 4  . nitroGLYCERIN (NITROSTAT) 0.4 MG SL tablet Place 1 tablet (0.4 mg total) under the tongue every 5 (five) minutes as needed for chest pain. If no relief  after first dose, call 911. 30 tablet 1  . pantoprazole (PROTONIX) 20 MG tablet TAKE 1 TABLET (20 MG TOTAL) BY MOUTH DAILY. 30 tablet 1  . topiramate (TOPAMAX) 50 MG tablet Take 50 mg by mouth 2 (two) times daily.    . VENTOLIN HFA 108 (90 Base) MCG/ACT inhaler INHALE 2 PUFFS EVERY 4 HOURS AS NEEDED. REPLACES ALBUTEROL. 54 g 3   No facility-administered medications prior to visit.    No Known Allergies  ROS Review of Systems    Objective:    Physical Exam  BP 126/85 (BP Location: Left Arm, Patient Position: Sitting)   Pulse 89   Temp (!) 97.1 F (36.2 C)   Ht 5\' 10"  (1.778 m)   Wt 209 lb 4.8 oz (94.9 kg)   SpO2 94%   BMI 30.03 kg/m  Wt Readings from Last 3 Encounters:  12/04/20 209 lb 4.8 oz (94.9 kg)  11/28/20 211 lb (95.7 kg)  11/13/20 212 lb 4.6 oz (96.3 kg)     Health Maintenance Due  Topic  Date Due  . Hepatitis C Screening  Never done  . HIV Screening  Never done  . TETANUS/TDAP  Never done  . COLONOSCOPY (Pts 45-67yrs Insurance coverage will need to be confirmed)  Never done  . OPHTHALMOLOGY EXAM  09/03/2017  . COVID-19 Vaccine (3 - Booster for Moderna series) 09/22/2020    There are no preventive care reminders to display for this patient.  Lab Results  Component Value Date   TSH 1.530 03/06/2020   Lab Results  Component Value Date   WBC 5.8 11/13/2020   HGB 15.4 11/13/2020   HCT 45.9 11/13/2020   MCV 92 11/13/2020   PLT 183 11/13/2020   Lab Results  Component Value Date   NA 139 11/13/2020   K 4.1 11/13/2020   CO2 21 11/13/2020   GLUCOSE 115 (H) 11/13/2020   BUN 13 11/13/2020   CREATININE 0.83 11/13/2020   BILITOT 0.4 11/13/2020   ALKPHOS 84 11/13/2020   AST 23 11/13/2020   ALT 29 11/13/2020   PROT 7.5 11/13/2020   ALBUMIN 4.5 11/13/2020   CALCIUM 9.5 11/13/2020   ANIONGAP 10 04/10/2020   Lab Results  Component Value Date   CHOL 129 08/28/2020   Lab Results  Component Value Date   HDL 38 (L) 08/28/2020   Lab Results  Component Value Date   LDLCALC 76 08/28/2020   Lab Results  Component Value Date   TRIG 77 08/28/2020   Lab Results  Component Value Date   CHOLHDL 3.4 08/28/2020   Lab Results  Component Value Date   HGBA1C 6.0 (H) 11/13/2020      Assessment & Plan:   Problem List Items Addressed This Visit      Cardiovascular and Mediastinum   Essential hypertension - Primary     Other   Perineal lump      1. Perineal lump Appears to have sebaceous cyst in perineum are measuring 1.5 x 1/4 in. Has had it for as period of time, rarely causes any symptoms. Will continue to follow.   FU in 3 months with labs.    No orders of the defined types were placed in this encounter.   Follow-up: No follow-ups on file.    Husein Guedes 11/15/2020

## 2020-12-11 ENCOUNTER — Other Ambulatory Visit: Payer: Self-pay | Admitting: Gerontology

## 2020-12-11 ENCOUNTER — Other Ambulatory Visit: Payer: Self-pay

## 2020-12-11 DIAGNOSIS — K219 Gastro-esophageal reflux disease without esophagitis: Secondary | ICD-10-CM

## 2020-12-11 DIAGNOSIS — E1169 Type 2 diabetes mellitus with other specified complication: Secondary | ICD-10-CM

## 2020-12-11 MED ORDER — ATORVASTATIN CALCIUM 40 MG PO TABS
ORAL_TABLET | Freq: Every day | ORAL | 1 refills | Status: DC
  Filled 2020-12-11: qty 30, 30d supply, fill #0
  Filled 2021-01-21: qty 30, 30d supply, fill #1

## 2020-12-11 MED ORDER — PANTOPRAZOLE SODIUM 20 MG PO TBEC
DELAYED_RELEASE_TABLET | Freq: Every day | ORAL | 1 refills | Status: DC
  Filled 2020-12-11: qty 30, 30d supply, fill #0
  Filled 2021-02-25: qty 30, 30d supply, fill #1

## 2020-12-11 MED FILL — Gabapentin Cap 300 MG: ORAL | 30 days supply | Qty: 60 | Fill #0 | Status: AC

## 2021-01-09 ENCOUNTER — Other Ambulatory Visit: Payer: Self-pay

## 2021-01-21 ENCOUNTER — Other Ambulatory Visit: Payer: Self-pay

## 2021-01-21 MED FILL — Amlodipine Besylate Tab 5 MG (Base Equivalent): ORAL | 30 days supply | Qty: 30 | Fill #0 | Status: AC

## 2021-01-27 ENCOUNTER — Other Ambulatory Visit: Payer: Self-pay | Admitting: Internal Medicine

## 2021-01-27 ENCOUNTER — Other Ambulatory Visit: Payer: Self-pay

## 2021-01-28 ENCOUNTER — Other Ambulatory Visit: Payer: Self-pay

## 2021-01-28 MED ORDER — PROAIR HFA 108 (90 BASE) MCG/ACT IN AERS
INHALATION_SPRAY | RESPIRATORY_TRACT | 3 refills | Status: DC
  Filled 2021-01-28: qty 25.5, 50d supply, fill #0
  Filled 2021-03-28: qty 25.5, 50d supply, fill #1
  Filled 2021-05-13 – 2021-05-16 (×4): qty 25.5, 50d supply, fill #2
  Filled 2021-07-29: qty 25.5, 50d supply, fill #3

## 2021-01-29 ENCOUNTER — Telehealth: Payer: Self-pay | Admitting: Pharmacist

## 2021-01-29 ENCOUNTER — Other Ambulatory Visit: Payer: Self-pay

## 2021-01-29 NOTE — Telephone Encounter (Signed)
Patient approved for medication assistance at [facility] until 12/22/21, as long as eligibility criteria continues to be met.   Roy Norton [facility] Administrative Assistant 

## 2021-01-30 ENCOUNTER — Other Ambulatory Visit: Payer: Self-pay

## 2021-02-03 ENCOUNTER — Other Ambulatory Visit: Payer: Self-pay

## 2021-02-03 MED FILL — Gabapentin Cap 300 MG: ORAL | 30 days supply | Qty: 60 | Fill #1 | Status: AC

## 2021-02-25 ENCOUNTER — Other Ambulatory Visit: Payer: Self-pay | Admitting: Gerontology

## 2021-02-25 ENCOUNTER — Other Ambulatory Visit: Payer: Self-pay

## 2021-02-25 DIAGNOSIS — E1169 Type 2 diabetes mellitus with other specified complication: Secondary | ICD-10-CM

## 2021-02-25 DIAGNOSIS — E785 Hyperlipidemia, unspecified: Secondary | ICD-10-CM

## 2021-02-25 MED ORDER — ATORVASTATIN CALCIUM 40 MG PO TABS
ORAL_TABLET | Freq: Every day | ORAL | 1 refills | Status: DC
  Filled 2021-02-25: qty 30, 30d supply, fill #0
  Filled 2021-04-23 – 2021-04-28 (×4): qty 30, 30d supply, fill #1

## 2021-02-25 MED FILL — Amlodipine Besylate Tab 5 MG (Base Equivalent): ORAL | 30 days supply | Qty: 30 | Fill #1 | Status: AC

## 2021-02-26 ENCOUNTER — Other Ambulatory Visit: Payer: Self-pay

## 2021-02-26 DIAGNOSIS — I10 Essential (primary) hypertension: Secondary | ICD-10-CM

## 2021-02-27 ENCOUNTER — Ambulatory Visit: Payer: Self-pay | Admitting: Gerontology

## 2021-02-27 LAB — COMPREHENSIVE METABOLIC PANEL
ALT: 38 IU/L (ref 0–44)
AST: 24 IU/L (ref 0–40)
Albumin/Globulin Ratio: 1.6 (ref 1.2–2.2)
Albumin: 4.4 g/dL (ref 3.8–4.9)
Alkaline Phosphatase: 76 IU/L (ref 44–121)
BUN/Creatinine Ratio: 14 (ref 9–20)
BUN: 14 mg/dL (ref 6–24)
Bilirubin Total: 0.3 mg/dL (ref 0.0–1.2)
CO2: 23 mmol/L (ref 20–29)
Calcium: 9.3 mg/dL (ref 8.7–10.2)
Chloride: 102 mmol/L (ref 96–106)
Creatinine, Ser: 1 mg/dL (ref 0.76–1.27)
Globulin, Total: 2.8 g/dL (ref 1.5–4.5)
Glucose: 106 mg/dL — ABNORMAL HIGH (ref 65–99)
Potassium: 4.6 mmol/L (ref 3.5–5.2)
Sodium: 140 mmol/L (ref 134–144)
Total Protein: 7.2 g/dL (ref 6.0–8.5)
eGFR: 87 mL/min/{1.73_m2} (ref >59)

## 2021-02-27 LAB — HEMOGLOBIN A1C
Est. average glucose Bld gHb Est-mCnc: 120 mg/dL
Hgb A1c MFr Bld: 5.8 % — ABNORMAL HIGH (ref 4.8–5.6)

## 2021-03-05 ENCOUNTER — Encounter: Payer: Self-pay | Admitting: Internal Medicine

## 2021-03-05 ENCOUNTER — Ambulatory Visit: Payer: Self-pay | Admitting: Internal Medicine

## 2021-03-05 DIAGNOSIS — I10 Essential (primary) hypertension: Secondary | ICD-10-CM

## 2021-03-05 DIAGNOSIS — E119 Type 2 diabetes mellitus without complications: Secondary | ICD-10-CM

## 2021-03-05 NOTE — Progress Notes (Signed)
Established Patient Office Visit  Subjective:  Patient ID: Roy Norton, male    DOB: 08-28-1960  Age: 60 y.o. MRN: [id number]  CC:  Chief Complaint  Patient presents with   blackout    HPI Roy Norton is a 60 y/o male who presents for follow up for his blackout episodes, diabetes, and hypertension. His labs, blood pressure, and blood sugars were normal. He does not need any medication refills.  Past Medical History:  Diagnosis Date   Diabetes mellitus without complication (HCC)    Edentulous    GERD (gastroesophageal reflux disease)    Hyperlipidemia    Hypertension    Stroke ([place])    No deficits    Past Surgical History:  Procedure Laterality Date   CARDIAC CATHETERIZATION  2013    Family History  Problem Relation Age of Onset   Hypertension Mother    Hypertension Father    Diabetes Father     Social History   Socioeconomic History   Marital status: Single    Spouse name: Not on file   Number of children: Not on file   Years of education: Not on file   Highest education level: 11th grade  Occupational History   Occupation: none  Tobacco Use   Smoking status: Some Days    Packs/day: 1.00    Years: 10.00    Pack years: 10.00    Types: Cigarettes    Last attempt to quit: 12/18/2015    Years since quitting: 5.2   Smokeless tobacco: Never   Tobacco comments:    Trying to quit again  Vaping Use   Vaping Use: Never used  Substance and Sexual Activity   Alcohol use: No    Alcohol/week: 0.0 standard drinks   Drug use: No   Sexual activity: Not Currently  Other Topics Concern   Not on file  Social History Narrative   Rents, 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   Social Determinants of Health   Financial Resource Strain: Not on file  Food Insecurity: Not on file  Transportation Needs: Not on file  Physical Activity: Not on file  Stress: Not on file  Social Connections: Not on file  Intimate Partner Violence: Not on  file    Outpatient Medications Prior to Visit  Medication Sig Dispense Refill   amitriptyline (ELAVIL) 50 MG tablet Take 2 tablets (100 mg total) by mouth at bedtime. 60 tablet 2   amLODipine (NORVASC) 5 MG tablet TAKE ONE TABLET BY MOUTH EVERY DAY 30 tablet 1   aspirin 81 MG chewable tablet Chew 1 tablet (81 mg total) by mouth daily. 90 tablet 1   atorvastatin (LIPITOR) 40 MG tablet TAKE ONE TABLET BY MOUTH EVERY DAY 30 tablet 1   butalbital-acetaminophen-caffeine (FIORICET) 50-325-40 MG tablet Take 1 tablet by mouth as needed for headache.     Cetirizine HCl 10 MG CAPS Take 1 capsule (10 mg total) by mouth daily. 30 capsule 2   gabapentin (NEURONTIN) 300 MG capsule TAKE ONE CAPSULE BY MOUTH 2 TIMES A DAY 60 capsule 1   lisinopril (ZESTRIL) 20 MG tablet TAKE ONE TABLET BY MOUTH EVERY DAY 30 tablet 2   metFORMIN (GLUCOPHAGE) 500 MG tablet TAKE ONE TABLET BY MOUTH ONCE DAILY AT NIGHT 90 tablet 0   metoprolol tartrate (LOPRESSOR) 25 MG tablet TAKE ONE TABLET BY MOUTH 2 TIMES A DAY 180 tablet 4   nitroGLYCERIN (NITROSTAT) 0.4 MG SL tablet Place 1 tablet (0.4 mg  total) under the tongue every 5 (five) minutes as needed for chest pain. If no relief after first dose, call 911. 30 tablet 1   pantoprazole (PROTONIX) 20 MG tablet TAKE 1 TABLET (20 MG TOTAL) BY MOUTH DAILY. 30 tablet 1   PROAIR HFA 108 (90 Base) MCG/ACT inhaler INHALE 2 PUFFS EVERY 4 HOURS AS NEEDED. REPLACES ALBUTEROL. 25.5 g 3   topiramate (TOPAMAX) 50 MG tablet Take 50 mg by mouth 2 (two) times daily.     No facility-administered medications prior to visit.    No Known Allergies  ROS Review of Systems    Objective:    Physical Exam  There were no vitals taken for this visit. Wt Readings from Last 3 Encounters:  02/26/21 214 lb 1.6 oz (97.1 kg)  12/04/20 209 lb 4.8 oz (94.9 kg)  11/28/20 211 lb (95.7 kg)     Health Maintenance Due  Topic Date Due   HIV Screening  Never done   Hepatitis C Screening  Never done    TETANUS/TDAP  Never done   Zoster Vaccines- Shingrix (1 of 2) Never done   COLONOSCOPY (Pts 45-72yr Insurance coverage will need to be confirmed)  Never done   Pneumococcal Vaccine 066658Years old (2 - PCV) 04/28/2016   OPHTHALMOLOGY EXAM  09/03/2017   COVID-19 Vaccine (3 - Moderna risk series) 04/19/2020    There are no preventive care reminders to display for this patient.  Lab Results  Component Value Date   TSH 1.530 03/06/2020   Lab Results  Component Value Date   WBC 5.8 11/13/2020   HGB 15.4 11/13/2020   HCT 45.9 11/13/2020   MCV 92 11/13/2020   PLT 183 11/13/2020   Lab Results  Component Value Date   NA 140 02/26/2021   K 4.6 02/26/2021   CO2 23 02/26/2021   GLUCOSE 106 (H) 02/26/2021   BUN 14 02/26/2021   CREATININE 1.00 02/26/2021   BILITOT 0.3 02/26/2021   ALKPHOS 76 02/26/2021   AST 24 02/26/2021   ALT 38 02/26/2021   PROT 7.2 02/26/2021   ALBUMIN 4.4 02/26/2021   CALCIUM 9.3 02/26/2021   ANIONGAP 10 04/10/2020   EGFR 87 02/26/2021   Lab Results  Component Value Date   CHOL 129 08/28/2020   Lab Results  Component Value Date   HDL 38 (L) 08/28/2020   Lab Results  Component Value Date   LDLCALC 76 08/28/2020   Lab Results  Component Value Date   TRIG 77 08/28/2020   Lab Results  Component Value Date   CHOLHDL 3.4 08/28/2020   Lab Results  Component Value Date   HGBA1C 5.8 (H) 02/26/2021      Assessment & Plan:   1. Essential hypertension Hypertension is managed well with medication  2. Diabetes mellitus without complication ([facility] 6 months ago he was having hypoglycemic attacks with syncope. After cutting back and eliminating his Glipiside he has had no symptoms for 3 months. Curretly only taking metformin at night. Sugars are doing well, A1c is 5.8.  Patient and mother report he is doing well with no new symptoms. Plan to follow up in 6 months with appropriate labs.   Follow-up: No follow-ups on file.    Vonte M Hedgebeth

## 2021-03-26 ENCOUNTER — Telehealth: Payer: Self-pay | Admitting: Pharmacist

## 2021-03-26 NOTE — Telephone Encounter (Signed)
03/26/2021 8:15:18 AM - ProAir refill faxed to [facility]  -- Rhetta Mura - Wednesday, March 26, 2021 8:14 AM --Faxed ProAir refill form to [facility].

## 2021-03-28 ENCOUNTER — Other Ambulatory Visit: Payer: Self-pay

## 2021-03-28 ENCOUNTER — Other Ambulatory Visit: Payer: Self-pay | Admitting: Gerontology

## 2021-03-28 DIAGNOSIS — E119 Type 2 diabetes mellitus without complications: Secondary | ICD-10-CM

## 2021-03-28 DIAGNOSIS — K219 Gastro-esophageal reflux disease without esophagitis: Secondary | ICD-10-CM

## 2021-03-28 DIAGNOSIS — I10 Essential (primary) hypertension: Secondary | ICD-10-CM

## 2021-03-28 MED ORDER — PANTOPRAZOLE SODIUM 20 MG PO TBEC
DELAYED_RELEASE_TABLET | Freq: Every day | ORAL | 1 refills | Status: DC
  Filled 2021-03-28: qty 30, 30d supply, fill #0
  Filled 2021-04-23 – 2021-04-28 (×4): qty 30, 30d supply, fill #1

## 2021-03-28 MED ORDER — GABAPENTIN 300 MG PO CAPS
ORAL_CAPSULE | Freq: Two times a day (BID) | ORAL | 1 refills | Status: DC
  Filled 2021-03-28: qty 60, 30d supply, fill #0
  Filled 2021-04-23 – 2021-04-28 (×4): qty 60, 30d supply, fill #1

## 2021-03-28 MED ORDER — AMLODIPINE BESYLATE 5 MG PO TABS
ORAL_TABLET | Freq: Every day | ORAL | 1 refills | Status: DC
  Filled 2021-03-28: qty 30, 30d supply, fill #0
  Filled 2021-04-23 – 2021-04-28 (×4): qty 30, 30d supply, fill #1

## 2021-03-28 MED ORDER — METFORMIN HCL 500 MG PO TABS
ORAL_TABLET | ORAL | 0 refills | Status: DC
  Filled 2021-03-28: qty 30, 30d supply, fill #0
  Filled 2021-05-26: qty 30, 30d supply, fill #1
  Filled 2021-06-30: qty 30, 30d supply, fill #2

## 2021-03-28 MED ORDER — LISINOPRIL 20 MG PO TABS
ORAL_TABLET | Freq: Every day | ORAL | 2 refills | Status: DC
  Filled 2021-03-28: qty 30, 30d supply, fill #0
  Filled 2021-04-23 – 2021-04-28 (×4): qty 30, 30d supply, fill #1
  Filled 2021-05-21 – 2021-05-26 (×3): qty 30, 30d supply, fill #2

## 2021-03-28 MED FILL — Metoprolol Tartrate Tab 25 MG: ORAL | 90 days supply | Qty: 180 | Fill #0 | Status: AC

## 2021-03-31 ENCOUNTER — Other Ambulatory Visit: Payer: Self-pay

## 2021-04-11 ENCOUNTER — Other Ambulatory Visit: Payer: Self-pay

## 2021-04-23 ENCOUNTER — Other Ambulatory Visit: Payer: Self-pay

## 2021-04-24 ENCOUNTER — Other Ambulatory Visit: Payer: Self-pay

## 2021-04-25 ENCOUNTER — Other Ambulatory Visit: Payer: Self-pay

## 2021-04-29 ENCOUNTER — Other Ambulatory Visit: Payer: Self-pay

## 2021-05-09 ENCOUNTER — Other Ambulatory Visit: Payer: Self-pay

## 2021-05-13 ENCOUNTER — Other Ambulatory Visit: Payer: Self-pay

## 2021-05-14 ENCOUNTER — Other Ambulatory Visit: Payer: Self-pay

## 2021-05-15 ENCOUNTER — Other Ambulatory Visit: Payer: Self-pay

## 2021-05-16 ENCOUNTER — Other Ambulatory Visit: Payer: Self-pay

## 2021-05-21 ENCOUNTER — Other Ambulatory Visit: Payer: Self-pay | Admitting: Gerontology

## 2021-05-21 ENCOUNTER — Other Ambulatory Visit: Payer: Self-pay

## 2021-05-21 DIAGNOSIS — K219 Gastro-esophageal reflux disease without esophagitis: Secondary | ICD-10-CM

## 2021-05-21 DIAGNOSIS — E119 Type 2 diabetes mellitus without complications: Secondary | ICD-10-CM

## 2021-05-21 DIAGNOSIS — E1169 Type 2 diabetes mellitus with other specified complication: Secondary | ICD-10-CM

## 2021-05-21 DIAGNOSIS — I10 Essential (primary) hypertension: Secondary | ICD-10-CM

## 2021-05-21 MED ORDER — ATORVASTATIN CALCIUM 40 MG PO TABS
ORAL_TABLET | Freq: Every day | ORAL | 1 refills | Status: DC
  Filled 2021-05-21: qty 30, fill #0
  Filled 2021-05-22 – 2021-05-26 (×2): qty 30, 30d supply, fill #0
  Filled 2021-06-30: qty 30, 30d supply, fill #1

## 2021-05-21 MED ORDER — AMLODIPINE BESYLATE 5 MG PO TABS
ORAL_TABLET | Freq: Every day | ORAL | 1 refills | Status: DC
  Filled 2021-05-21: qty 30, fill #0
  Filled 2021-05-22 – 2021-05-26 (×3): qty 30, 30d supply, fill #0
  Filled 2021-06-30: qty 30, 30d supply, fill #1

## 2021-05-21 MED ORDER — PANTOPRAZOLE SODIUM 20 MG PO TBEC
DELAYED_RELEASE_TABLET | Freq: Every day | ORAL | 1 refills | Status: DC
Start: 2021-05-21 — End: 2021-07-25
  Filled 2021-05-21: qty 30, fill #0
  Filled 2021-05-22 – 2021-05-26 (×2): qty 30, 30d supply, fill #0
  Filled 2021-06-30: qty 30, 30d supply, fill #1

## 2021-05-21 MED ORDER — GABAPENTIN 300 MG PO CAPS
ORAL_CAPSULE | Freq: Two times a day (BID) | ORAL | 1 refills | Status: DC
  Filled 2021-05-21: qty 60, fill #0
  Filled 2021-05-22 – 2021-05-26 (×2): qty 60, 30d supply, fill #0
  Filled 2021-06-30: qty 60, 30d supply, fill #1

## 2021-05-22 ENCOUNTER — Other Ambulatory Visit: Payer: Self-pay

## 2021-05-23 ENCOUNTER — Other Ambulatory Visit: Payer: Self-pay

## 2021-05-26 ENCOUNTER — Other Ambulatory Visit: Payer: Self-pay | Admitting: Gerontology

## 2021-05-26 ENCOUNTER — Other Ambulatory Visit: Payer: Self-pay

## 2021-05-26 DIAGNOSIS — I10 Essential (primary) hypertension: Secondary | ICD-10-CM

## 2021-05-26 MED FILL — Metoprolol Tartrate Tab 25 MG: ORAL | 90 days supply | Qty: 180 | Fill #1 | Status: CN

## 2021-05-27 ENCOUNTER — Other Ambulatory Visit: Payer: Self-pay

## 2021-05-27 MED ORDER — NITROGLYCERIN 0.4 MG SL SUBL
0.4000 mg | SUBLINGUAL_TABLET | SUBLINGUAL | 1 refills | Status: AC | PRN
  Filled 2021-05-27: qty 25, 25d supply, fill #0
  Filled 2021-06-30: qty 25, 25d supply, fill #1

## 2021-06-19 ENCOUNTER — Other Ambulatory Visit: Payer: Self-pay

## 2021-06-19 MED FILL — Metoprolol Tartrate Tab 25 MG: ORAL | 90 days supply | Qty: 180 | Fill #1 | Status: CN

## 2021-06-20 ENCOUNTER — Other Ambulatory Visit: Payer: Self-pay

## 2021-06-20 MED FILL — Metoprolol Tartrate Tab 25 MG: ORAL | 90 days supply | Qty: 180 | Fill #1 | Status: CN

## 2021-06-23 ENCOUNTER — Other Ambulatory Visit: Payer: Self-pay

## 2021-06-23 MED FILL — Metoprolol Tartrate Tab 25 MG: ORAL | 90 days supply | Qty: 180 | Fill #1 | Status: CN

## 2021-06-25 ENCOUNTER — Other Ambulatory Visit: Payer: Self-pay

## 2021-06-25 ENCOUNTER — Other Ambulatory Visit: Payer: Self-pay | Admitting: Gerontology

## 2021-06-25 DIAGNOSIS — I10 Essential (primary) hypertension: Secondary | ICD-10-CM

## 2021-06-25 MED ORDER — LISINOPRIL 20 MG PO TABS
ORAL_TABLET | Freq: Every day | ORAL | 3 refills | Status: DC
  Filled 2021-06-30: qty 30, 30d supply, fill #0
  Filled 2021-07-29: qty 30, 30d supply, fill #1
  Filled 2021-09-04: qty 30, 30d supply, fill #2
  Filled 2021-10-03: qty 30, 30d supply, fill #3
  Filled 2021-10-29: qty 30, 30d supply, fill #4
  Filled 2021-12-03: qty 30, 30d supply, fill #5
  Filled 2022-01-07: qty 14, 14d supply, fill #6
  Filled 2022-01-29: qty 90, 90d supply, fill #0

## 2021-06-30 ENCOUNTER — Other Ambulatory Visit: Payer: Self-pay

## 2021-06-30 MED FILL — Metoprolol Tartrate Tab 25 MG: ORAL | 30 days supply | Qty: 60 | Fill #1 | Status: AC

## 2021-07-01 ENCOUNTER — Other Ambulatory Visit: Payer: Self-pay

## 2021-07-25 ENCOUNTER — Other Ambulatory Visit: Payer: Self-pay | Admitting: Gerontology

## 2021-07-25 ENCOUNTER — Other Ambulatory Visit: Payer: Self-pay

## 2021-07-25 DIAGNOSIS — I10 Essential (primary) hypertension: Secondary | ICD-10-CM

## 2021-07-25 DIAGNOSIS — E785 Hyperlipidemia, unspecified: Secondary | ICD-10-CM

## 2021-07-25 DIAGNOSIS — K219 Gastro-esophageal reflux disease without esophagitis: Secondary | ICD-10-CM

## 2021-07-25 DIAGNOSIS — E1169 Type 2 diabetes mellitus with other specified complication: Secondary | ICD-10-CM

## 2021-07-25 DIAGNOSIS — E119 Type 2 diabetes mellitus without complications: Secondary | ICD-10-CM

## 2021-07-29 ENCOUNTER — Other Ambulatory Visit: Payer: Self-pay

## 2021-07-29 MED ORDER — ATORVASTATIN CALCIUM 40 MG PO TABS
ORAL_TABLET | Freq: Every day | ORAL | 1 refills | Status: DC
  Filled 2021-07-29: qty 30, 30d supply, fill #0
  Filled 2021-09-04: qty 30, 30d supply, fill #1

## 2021-07-29 MED ORDER — PANTOPRAZOLE SODIUM 20 MG PO TBEC
DELAYED_RELEASE_TABLET | Freq: Every day | ORAL | 1 refills | Status: DC
  Filled 2021-07-29: qty 30, 30d supply, fill #0
  Filled 2021-09-04: qty 30, 30d supply, fill #1

## 2021-07-29 MED ORDER — GABAPENTIN 300 MG PO CAPS
ORAL_CAPSULE | Freq: Two times a day (BID) | ORAL | 1 refills | Status: DC
Start: 2021-07-29 — End: 2021-09-29
  Filled 2021-07-29: qty 60, 30d supply, fill #0
  Filled 2021-09-04: qty 60, 30d supply, fill #1

## 2021-07-29 MED ORDER — METFORMIN HCL 500 MG PO TABS
ORAL_TABLET | ORAL | 0 refills | Status: DC
  Filled 2021-07-29: qty 30, 30d supply, fill #0
  Filled 2021-09-04: qty 30, 30d supply, fill #1
  Filled 2021-10-03: qty 30, 30d supply, fill #2

## 2021-07-29 MED ORDER — AMLODIPINE BESYLATE 5 MG PO TABS
ORAL_TABLET | Freq: Every day | ORAL | 1 refills | Status: DC
  Filled 2021-07-29: qty 30, 30d supply, fill #0
  Filled 2021-09-04: qty 30, 30d supply, fill #1

## 2021-07-29 MED FILL — Metoprolol Tartrate Tab 25 MG: ORAL | 30 days supply | Qty: 60 | Fill #2 | Status: AC

## 2021-08-27 ENCOUNTER — Other Ambulatory Visit: Payer: Self-pay

## 2021-08-27 DIAGNOSIS — I10 Essential (primary) hypertension: Secondary | ICD-10-CM

## 2021-08-27 DIAGNOSIS — E119 Type 2 diabetes mellitus without complications: Secondary | ICD-10-CM

## 2021-08-28 LAB — COMPREHENSIVE METABOLIC PANEL
ALT: 39 IU/L (ref 0–44)
AST: 21 IU/L (ref 0–40)
Albumin/Globulin Ratio: 1.5 (ref 1.2–2.2)
Albumin: 4.7 g/dL (ref 3.8–4.9)
Alkaline Phosphatase: 91 IU/L (ref 44–121)
BUN/Creatinine Ratio: 17 (ref 9–20)
BUN: 15 mg/dL (ref 6–24)
Bilirubin Total: 0.5 mg/dL (ref 0.0–1.2)
CO2: 21 mmol/L (ref 20–29)
Calcium: 9.9 mg/dL (ref 8.7–10.2)
Chloride: 102 mmol/L (ref 96–106)
Creatinine, Ser: 0.9 mg/dL (ref 0.76–1.27)
Globulin, Total: 3.2 g/dL (ref 1.5–4.5)
Glucose: 123 mg/dL — ABNORMAL HIGH (ref 70–99)
Potassium: 4.4 mmol/L (ref 3.5–5.2)
Sodium: 140 mmol/L (ref 134–144)
Total Protein: 7.9 g/dL (ref 6.0–8.5)
eGFR: 98 mL/min/{1.73_m2} (ref >59)

## 2021-08-28 LAB — LIPID PANEL
Chol/HDL Ratio: 3.9 ratio (ref 0.0–5.0)
Cholesterol, Total: 158 mg/dL (ref 100–199)
HDL: 41 mg/dL (ref >39)
LDL Chol Calc (NIH): 96 mg/dL (ref 0–99)
Triglycerides: 116 mg/dL (ref 0–149)
VLDL Cholesterol Cal: 21 mg/dL (ref 5–40)

## 2021-08-28 LAB — CBC
Hematocrit: 46.8 % (ref 37.5–51.0)
Hemoglobin: 16.7 g/dL (ref 13.0–17.7)
MCH: 31.7 pg (ref 26.6–33.0)
MCHC: 35.7 g/dL (ref 31.5–35.7)
MCV: 89 fL (ref 79–97)
Platelets: 220 10*3/uL (ref 150–450)
RBC: 5.26 x10E6/uL (ref 4.14–5.80)
RDW: 13.2 % (ref 11.6–15.4)
WBC: 10.1 10*3/uL (ref 3.4–10.8)

## 2021-08-28 LAB — PSA: Prostate Specific Ag, Serum: 0.7 ng/mL (ref 0.0–4.0)

## 2021-08-28 LAB — HEMOGLOBIN A1C
Est. average glucose Bld gHb Est-mCnc: 134 mg/dL
Hgb A1c MFr Bld: 6.3 % — ABNORMAL HIGH (ref 4.8–5.6)

## 2021-08-28 LAB — TSH: TSH: 1.77 u[IU]/mL (ref 0.450–4.500)

## 2021-09-02 ENCOUNTER — Telehealth: Payer: Self-pay | Admitting: Pharmacist

## 2021-09-02 NOTE — Telephone Encounter (Signed)
09/02/2021 9:03:25 AM - ProAir refill faxed to [facility] - Tuesday, September 02, 2021 8:54 AM --  Faxed ProAir Refill to [facility]

## 2021-09-03 ENCOUNTER — Ambulatory Visit: Payer: Self-pay | Admitting: Internal Medicine

## 2021-09-03 ENCOUNTER — Encounter: Payer: Self-pay | Admitting: Internal Medicine

## 2021-09-03 DIAGNOSIS — I10 Essential (primary) hypertension: Secondary | ICD-10-CM

## 2021-09-03 DIAGNOSIS — E119 Type 2 diabetes mellitus without complications: Secondary | ICD-10-CM

## 2021-09-03 NOTE — Progress Notes (Signed)
Established Patient Office Visit  Subjective:  Patient ID: Roy Norton, male    DOB: 21-Jun-1961  Age: 61 y.o. MRN: [id number]  CC: No chief complaint on file.   HPI INMAN FETTIG presents for follow up of multiple problems.  Patient states his glasses broke. Offered referral to [facility]. Patient declined. Patient was also advised that he could get an eye exam and bring prescription here to get free glasses.  Patient has not been checking his blood sugars, but states he feels ok. Recommended to check blood sugars at least prior to breakfast.  Past Medical History:  Diagnosis Date   Diabetes mellitus without complication (HCC)    Edentulous    GERD (gastroesophageal reflux disease)    Hyperlipidemia    Hypertension    Stroke ([place])    No deficits    Past Surgical History:  Procedure Laterality Date   CARDIAC CATHETERIZATION  2013    Family History  Problem Relation Age of Onset   Hypertension Mother    Hypertension Father    Diabetes Father     Social History   Socioeconomic History   Marital status: Single    Spouse name: Not on file   Number of children: Not on file   Years of education: Not on file   Highest education level: 11th grade  Occupational History   Occupation: none  Tobacco Use   Smoking status: Some Days    Packs/day: 1.00    Years: 10.00    Pack years: 10.00    Types: Cigarettes    Last attempt to quit: 12/18/2015    Years since quitting: 5.7   Smokeless tobacco: Never   Tobacco comments:    Trying to quit again  Vaping Use   Vaping Use: Never used  Substance and Sexual Activity   Alcohol use: No    Alcohol/week: 0.0 standard drinks   Drug use: No   Sexual activity: Not Currently  Other Topics Concern   Not on file  Social History Narrative   Rents, 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   Social Determinants of Health   Financial Resource Strain: Not on file  Food Insecurity: Not on file   Transportation Needs: Not on file  Physical Activity: Not on file  Stress: Not on file  Social Connections: Not on file  Intimate Partner Violence: Not on file    Outpatient Medications Prior to Visit  Medication Sig Dispense Refill   amitriptyline (ELAVIL) 50 MG tablet Take 2 tablets (100 mg total) by mouth at bedtime. 60 tablet 2   amLODipine (NORVASC) 5 MG tablet TAKE ONE TABLET BY MOUTH ONCE EVERY DAY. 30 tablet 1   aspirin 81 MG chewable tablet Chew 1 tablet (81 mg total) by mouth daily. 90 tablet 1   atorvastatin (LIPITOR) 40 MG tablet TAKE ONE TABLET BY MOUTH ONCE EVERY DAY. 30 tablet 1   butalbital-acetaminophen-caffeine (FIORICET) 50-325-40 MG tablet Take 1 tablet by mouth as needed for headache.     Cetirizine HCl 10 MG CAPS Take 1 capsule (10 mg total) by mouth daily. 30 capsule 2   gabapentin (NEURONTIN) 300 MG capsule TAKE ONE CAPSULE BY MOUTH 2 TIMES A DAY 60 capsule 1   lisinopril (ZESTRIL) 20 MG tablet TAKE ONE TABLET BY MOUTH ONCE EVERY DAY 90 tablet 3   metFORMIN (GLUCOPHAGE) 500 MG tablet TAKE ONE TABLET BY MOUTH ONCE NIGHTLY. 90 tablet 0   metoprolol tartrate (LOPRESSOR) 25  MG tablet TAKE ONE TABLET BY MOUTH 2 TIMES A DAY 180 tablet 4   nitroGLYCERIN (NITROSTAT) 0.4 MG SL tablet Dissolve 1 tablet (0.4 mg total) under the tongue once every 5 (five) minutes as needed for chest pain. If no relief after first dose, call 911. (Max of 3 doses in 15 minutes). 30 tablet 1   pantoprazole (PROTONIX) 20 MG tablet TAKE 1 TABLET (20 MG TOTAL) BY MOUTH ONCE DAILY. 30 tablet 1   PROAIR HFA 108 (90 Base) MCG/ACT inhaler INHALE 2 PUFFS EVERY 4 HOURS AS NEEDED. REPLACES ALBUTEROL. 25.5 g 3   topiramate (TOPAMAX) 50 MG tablet Take 50 mg by mouth 2 (two) times daily.     No facility-administered medications prior to visit.    No Known Allergies  ROS Review of Systems    Objective:    Physical Exam  There were no vitals taken for this visit. Wt Readings from Last 3 Encounters:   08/27/21 208 lb 14.4 oz (94.8 kg)  02/26/21 214 lb 1.6 oz (97.1 kg)  12/04/20 209 lb 4.8 oz (94.9 kg)     Health Maintenance Due  Topic Date Due   HIV Screening  Never done   Hepatitis C Screening  Never done   TETANUS/TDAP  Never done   Zoster Vaccines- Shingrix (1 of 2) Never done   COLONOSCOPY (Pts 45-53yr Insurance coverage will need to be confirmed)  Never done   Pneumococcal Vaccine 12462Years old (2 - PCV) 04/28/2016   OPHTHALMOLOGY EXAM  09/03/2017   COVID-19 Vaccine (3 - Moderna risk series) 04/19/2020   FOOT EXAM  03/07/2021   INFLUENZA VACCINE  03/24/2021    There are no preventive care reminders to display for this patient.  Lab Results  Component Value Date   TSH 1.770 08/27/2021   Lab Results  Component Value Date   WBC 10.1 08/27/2021   HGB 16.7 08/27/2021   HCT 46.8 08/27/2021   MCV 89 08/27/2021   PLT 220 08/27/2021   Lab Results  Component Value Date   NA 140 08/27/2021   K 4.4 08/27/2021   CO2 21 08/27/2021   GLUCOSE 123 (H) 08/27/2021   BUN 15 08/27/2021   CREATININE 0.90 08/27/2021   BILITOT 0.5 08/27/2021   ALKPHOS 91 08/27/2021   AST 21 08/27/2021   ALT 39 08/27/2021   PROT 7.9 08/27/2021   ALBUMIN 4.7 08/27/2021   CALCIUM 9.9 08/27/2021   ANIONGAP 10 04/10/2020   EGFR 98 08/27/2021   Lab Results  Component Value Date   CHOL 158 08/27/2021   Lab Results  Component Value Date   HDL 41 08/27/2021   Lab Results  Component Value Date   LDLCALC 96 08/27/2021   Lab Results  Component Value Date   TRIG 116 08/27/2021   Lab Results  Component Value Date   CHOLHDL 3.9 08/27/2021   Lab Results  Component Value Date   HGBA1C 6.3 (H) 08/27/2021      Assessment & Plan:   Problem List Items Addressed This Visit       Cardiovascular and Mediastinum   Essential hypertension     Endocrine   Diabetes mellitus without complication ([facility] - Primary    1. Diabetes mellitus without complication ([facility] Patient admits to not  checking his blood sugars. Patient has not had any syncopal episodes since decreasing diabetes medications from the last syncopal episode. Blood sugar checked in the office ~ 10:30 was 123 and A1C 6.3. Appears to be stable. No  medications changes suggested.  2. Essential hypertension Office blood pressure readings are WNL. No medication changes recommended.  Patient has broken his glasses and needs eye exam. Offered referral to [facility] Patient declined due to transportation. Also suggested if patient could get an eye exam local then he could bring prescription to [facility] and get his glasses at no charge. Patient declined at this time.  Follow-up:  Follow up 6 months with labs prior.    Sonnie Alamo, CMA

## 2021-09-04 ENCOUNTER — Other Ambulatory Visit: Payer: Self-pay

## 2021-09-04 MED FILL — Metoprolol Tartrate Tab 25 MG: ORAL | 30 days supply | Qty: 60 | Fill #3 | Status: AC

## 2021-09-16 ENCOUNTER — Other Ambulatory Visit: Payer: Self-pay | Admitting: Gerontology

## 2021-09-16 ENCOUNTER — Other Ambulatory Visit: Payer: Self-pay

## 2021-09-16 DIAGNOSIS — Z87898 Personal history of other specified conditions: Secondary | ICD-10-CM

## 2021-09-16 MED ORDER — PROAIR HFA 108 (90 BASE) MCG/ACT IN AERS
INHALATION_SPRAY | RESPIRATORY_TRACT | 3 refills | Status: AC
  Filled 2021-09-16 – 2022-01-29 (×3): qty 25.5, 50d supply, fill #0
  Filled ????-??-??: fill #0

## 2021-09-18 ENCOUNTER — Other Ambulatory Visit: Payer: Self-pay

## 2021-09-29 ENCOUNTER — Other Ambulatory Visit: Payer: Self-pay

## 2021-09-29 ENCOUNTER — Other Ambulatory Visit: Payer: Self-pay | Admitting: Gerontology

## 2021-09-29 DIAGNOSIS — E785 Hyperlipidemia, unspecified: Secondary | ICD-10-CM

## 2021-09-29 DIAGNOSIS — I10 Essential (primary) hypertension: Secondary | ICD-10-CM

## 2021-09-29 DIAGNOSIS — K219 Gastro-esophageal reflux disease without esophagitis: Secondary | ICD-10-CM

## 2021-09-29 DIAGNOSIS — E119 Type 2 diabetes mellitus without complications: Secondary | ICD-10-CM

## 2021-09-29 DIAGNOSIS — E1169 Type 2 diabetes mellitus with other specified complication: Secondary | ICD-10-CM

## 2021-09-30 ENCOUNTER — Other Ambulatory Visit: Payer: Self-pay

## 2021-09-30 MED ORDER — ATORVASTATIN CALCIUM 40 MG PO TABS
ORAL_TABLET | Freq: Every day | ORAL | 1 refills | Status: DC
  Filled 2021-10-03: qty 30, 30d supply, fill #0
  Filled 2021-10-29: qty 30, 30d supply, fill #1

## 2021-09-30 MED ORDER — PANTOPRAZOLE SODIUM 20 MG PO TBEC
DELAYED_RELEASE_TABLET | Freq: Every day | ORAL | 1 refills | Status: DC
  Filled 2021-10-03: qty 30, 30d supply, fill #0
  Filled 2021-10-29: qty 30, 30d supply, fill #1

## 2021-09-30 MED ORDER — AMLODIPINE BESYLATE 5 MG PO TABS
ORAL_TABLET | Freq: Every day | ORAL | 1 refills | Status: DC
Start: 2021-09-30 — End: 2021-11-28
  Filled 2021-10-03: qty 30, 30d supply, fill #0
  Filled 2021-10-29: qty 30, 30d supply, fill #1

## 2021-09-30 MED ORDER — GABAPENTIN 300 MG PO CAPS
ORAL_CAPSULE | Freq: Two times a day (BID) | ORAL | 1 refills | Status: DC
  Filled 2021-10-03: qty 60, 30d supply, fill #0
  Filled 2021-10-29: qty 60, 30d supply, fill #1

## 2021-10-03 ENCOUNTER — Other Ambulatory Visit: Payer: Self-pay

## 2021-10-03 MED FILL — Metoprolol Tartrate Tab 25 MG: ORAL | 30 days supply | Qty: 60 | Fill #4 | Status: AC

## 2021-10-13 ENCOUNTER — Other Ambulatory Visit: Payer: Self-pay

## 2021-10-29 ENCOUNTER — Other Ambulatory Visit: Payer: Self-pay | Admitting: Gerontology

## 2021-10-29 ENCOUNTER — Other Ambulatory Visit: Payer: Self-pay

## 2021-10-29 DIAGNOSIS — E119 Type 2 diabetes mellitus without complications: Secondary | ICD-10-CM

## 2021-10-29 MED ORDER — METFORMIN HCL 500 MG PO TABS
ORAL_TABLET | ORAL | 1 refills | Status: DC
  Filled 2021-10-29: qty 30, 30d supply, fill #0
  Filled 2021-12-03: qty 30, 30d supply, fill #1
  Filled 2022-01-07: qty 30, 30d supply, fill #2
  Filled 2022-01-29: qty 30, 30d supply, fill #0

## 2021-10-29 MED FILL — Metoprolol Tartrate Tab 25 MG: ORAL | 30 days supply | Qty: 60 | Fill #5 | Status: AC

## 2021-11-14 ENCOUNTER — Other Ambulatory Visit: Payer: Self-pay

## 2021-11-28 ENCOUNTER — Other Ambulatory Visit: Payer: Self-pay | Admitting: Internal Medicine

## 2021-11-28 ENCOUNTER — Other Ambulatory Visit: Payer: Self-pay | Admitting: Gerontology

## 2021-11-28 DIAGNOSIS — I10 Essential (primary) hypertension: Secondary | ICD-10-CM

## 2021-11-28 DIAGNOSIS — K219 Gastro-esophageal reflux disease without esophagitis: Secondary | ICD-10-CM

## 2021-11-28 DIAGNOSIS — E119 Type 2 diabetes mellitus without complications: Secondary | ICD-10-CM

## 2021-11-28 DIAGNOSIS — E1169 Type 2 diabetes mellitus with other specified complication: Secondary | ICD-10-CM

## 2021-12-02 ENCOUNTER — Other Ambulatory Visit: Payer: Self-pay

## 2021-12-03 ENCOUNTER — Other Ambulatory Visit: Payer: Self-pay

## 2021-12-03 MED ORDER — METOPROLOL TARTRATE 25 MG PO TABS
ORAL_TABLET | Freq: Two times a day (BID) | ORAL | 0 refills | Status: DC
  Filled 2021-12-03: qty 60, 30d supply, fill #0
  Filled 2022-01-07: qty 60, 30d supply, fill #1
  Filled 2022-01-29: qty 60, 30d supply, fill #0

## 2021-12-03 MED ORDER — GABAPENTIN 300 MG PO CAPS
ORAL_CAPSULE | Freq: Two times a day (BID) | ORAL | 2 refills | Status: DC
  Filled 2021-12-03: qty 60, 30d supply, fill #0
  Filled 2022-01-07: qty 60, 30d supply, fill #1
  Filled 2022-01-29: qty 60, 30d supply, fill #0

## 2021-12-03 MED ORDER — PANTOPRAZOLE SODIUM 20 MG PO TBEC
DELAYED_RELEASE_TABLET | Freq: Every day | ORAL | 2 refills | Status: DC
  Filled 2021-12-03: qty 30, 30d supply, fill #0
  Filled 2022-01-07: qty 30, 30d supply, fill #1
  Filled 2022-01-29: qty 30, 30d supply, fill #0

## 2021-12-03 MED ORDER — ATORVASTATIN CALCIUM 40 MG PO TABS
ORAL_TABLET | Freq: Every day | ORAL | 2 refills | Status: DC
  Filled 2021-12-03: qty 30, 30d supply, fill #0
  Filled 2022-01-07: qty 30, 30d supply, fill #1
  Filled 2022-01-29: qty 30, 30d supply, fill #0

## 2021-12-03 MED ORDER — AMLODIPINE BESYLATE 5 MG PO TABS
ORAL_TABLET | Freq: Every day | ORAL | 2 refills | Status: DC
  Filled 2021-12-03: qty 30, 30d supply, fill #0
  Filled 2022-01-07: qty 30, 30d supply, fill #1
  Filled 2022-01-29: qty 30, 30d supply, fill #0

## 2021-12-04 ENCOUNTER — Other Ambulatory Visit: Payer: Self-pay

## 2021-12-05 ENCOUNTER — Ambulatory Visit: Payer: Self-pay | Admitting: Pharmacy Technician

## 2021-12-05 DIAGNOSIS — Z79899 Other long term (current) drug therapy: Secondary | ICD-10-CM

## 2021-12-05 NOTE — Progress Notes (Signed)
Patient asked for assistance for completion of paperwork.  Wanted a phone appointment instead of an in-person one.  Attempted to contact patient 2 times using the phone number in [facility].  Unable to reach patient.  Received message indicating that mailbox for patient had not been set-up. ? ?Sherilyn Dacosta ?Care Manager ?[facility] ?

## 2021-12-26 ENCOUNTER — Other Ambulatory Visit: Payer: Self-pay

## 2022-01-05 ENCOUNTER — Telehealth: Payer: Self-pay | Admitting: Pharmacist

## 2022-01-05 NOTE — Telephone Encounter (Signed)
01/05/2022 2:38:13 PM - ProAir refill faxed to [facility] ?

## 2022-01-07 ENCOUNTER — Other Ambulatory Visit: Payer: Self-pay

## 2022-01-16 ENCOUNTER — Other Ambulatory Visit: Payer: Self-pay

## 2022-01-16 ENCOUNTER — Telehealth: Payer: Self-pay | Admitting: Pharmacy Technician

## 2022-01-16 NOTE — Telephone Encounter (Signed)
Received updated proof of income.  Patient eligible to receive medication assistance at [facility] until time for re-certification in 2024, and as long as eligibility requirements continue to be met.  Roy Norton J. [name] Care Manager [facility]  

## 2022-01-20 ENCOUNTER — Other Ambulatory Visit: Payer: Self-pay

## 2022-01-29 ENCOUNTER — Other Ambulatory Visit: Payer: Self-pay

## 2022-02-05 ENCOUNTER — Ambulatory Visit: Payer: Self-pay | Admitting: Gerontology

## 2022-02-05 VITALS — BP 109/75 | HR 95 | Temp 97.9°F | Ht 70.8 in | Wt 200.4 lb

## 2022-02-05 DIAGNOSIS — Z636 Dependent relative needing care at home: Secondary | ICD-10-CM

## 2022-02-05 DIAGNOSIS — I10 Essential (primary) hypertension: Secondary | ICD-10-CM

## 2022-02-05 DIAGNOSIS — K219 Gastro-esophageal reflux disease without esophagitis: Secondary | ICD-10-CM

## 2022-02-05 DIAGNOSIS — E1169 Type 2 diabetes mellitus with other specified complication: Secondary | ICD-10-CM

## 2022-02-05 DIAGNOSIS — R519 Headache, unspecified: Secondary | ICD-10-CM | POA: Insufficient documentation

## 2022-02-05 DIAGNOSIS — E119 Type 2 diabetes mellitus without complications: Secondary | ICD-10-CM

## 2022-02-05 LAB — POCT GLYCOSYLATED HEMOGLOBIN (HGB A1C): Hemoglobin A1C: 14 % — AB (ref 4.0–5.6)

## 2022-02-05 LAB — GLUCOSE, POCT (MANUAL RESULT ENTRY): POC Glucose: 384 mg/dl — AB (ref 70–99)

## 2022-02-05 MED ORDER — PANTOPRAZOLE SODIUM 20 MG PO TBEC
20.0000 mg | DELAYED_RELEASE_TABLET | Freq: Every day | ORAL | 2 refills | Status: DC
  Filled 2022-02-05: qty 30, fill #0
  Filled 2022-10-21: qty 30, 30d supply, fill #0
  Filled 2022-11-13: qty 30, 30d supply, fill #1
  Filled 2022-12-24: qty 30, 30d supply, fill #2

## 2022-02-05 MED ORDER — BASAGLAR KWIKPEN 100 UNIT/ML ~~LOC~~ SOPN
13.0000 [IU] | PEN_INJECTOR | Freq: Every day | SUBCUTANEOUS | 3 refills | Status: DC
  Filled 2022-02-05: qty 3, 23d supply, fill #0
  Filled 2022-04-06: qty 3, 23d supply, fill #1

## 2022-02-05 MED ORDER — LISINOPRIL 20 MG PO TABS
ORAL_TABLET | Freq: Every day | ORAL | 3 refills | Status: DC
  Filled 2022-02-05: qty 90, fill #0
  Filled 2022-05-05: qty 90, 90d supply, fill #0
  Filled 2022-08-11: qty 90, 90d supply, fill #1
  Filled 2022-11-13: qty 90, 90d supply, fill #2
  Filled 2023-01-20: qty 90, 90d supply, fill #3

## 2022-02-05 MED ORDER — AMLODIPINE BESYLATE 5 MG PO TABS
ORAL_TABLET | Freq: Every day | ORAL | 2 refills | Status: DC
  Filled 2022-02-05: qty 30, fill #0
  Filled 2022-03-10: qty 30, 30d supply, fill #0
  Filled 2022-04-06: qty 30, 30d supply, fill #1
  Filled 2022-05-05: qty 30, 30d supply, fill #2

## 2022-02-05 MED ORDER — ATORVASTATIN CALCIUM 40 MG PO TABS
ORAL_TABLET | Freq: Every day | ORAL | 2 refills | Status: DC
  Filled 2022-02-05: qty 30, fill #0
  Filled 2022-03-10: qty 30, 30d supply, fill #0
  Filled 2022-04-06: qty 30, 30d supply, fill #1
  Filled 2022-05-05: qty 30, 30d supply, fill #2

## 2022-02-05 MED ORDER — INSULIN PEN NEEDLE 31G X 5 MM MISC
1.0000 | Freq: Every day | 2 refills | Status: DC
  Filled 2022-02-05: qty 100, 90d supply, fill #0
  Filled 2022-04-06: qty 100, 90d supply, fill #1
  Filled 2022-05-05: qty 100, 90d supply, fill #2
  Filled 2022-05-07: qty 100, 30d supply, fill #2

## 2022-02-05 MED ORDER — METFORMIN HCL 500 MG PO TABS
500.0000 mg | ORAL_TABLET | Freq: Two times a day (BID) | ORAL | 1 refills | Status: DC
  Filled 2022-02-05: qty 60, 30d supply, fill #0

## 2022-02-05 NOTE — Progress Notes (Unsigned)
Established Patient Office Visit  Subjective   Patient ID: Roy Norton, male    DOB: Jun 17, 1961  Age: 61 y.o. MRN: [id number]  Chief Complaint  Patient presents with   Headache    Pt states that Monday he woke up in the morning with a headache and it lasts all day, every day since Monday. Pt states that it starts in the back and comes around the front of his head over his eyes. Patient takes a "pain pill, little brown pill" his Fioricet , it helps him sleep, but headache comes back. Pt denies congestion or other "cold symptoms". Pt states he has been keeping hydrated. Pt states that he has dry mouth, despite drinking a lot of water.    HPI Roy Norton presents for follow up of multiple problems.  Patient stat, he c/o headache that starts from the back of his head and radiates to his temporal area. He states that he woke up with the headache 4 days ago, states that his whole head hurts. He states that headache is constant, and intensity of 10/10 and it pulsates.. He states that he has taken Fioricet with no relief. He reports experiencing nausea, but no vomitting, but denies vision changes.  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 He states that he is not getting enough sleep, states that he gets 4-5 hours of sleep some nights and most of the time he stays up with his step father through the night. He states that he's worried about his step father and it's wearing him down. He also c/o dry mouth and polydipsia. His last HgbA1c done 08/27/21 was 6.3 % He also c/o intermittent chest pain that started last week , he describes as being stabed with a knife, but taking  4 tabs of 81 mg and Nitroglycerine with moderate relief. He states that his last episode of chest pain was one week ago.     Review of Systems  Constitutional: Negative.   Eyes: Negative.   Respiratory: Negative.    Cardiovascular: Negative.   Genitourinary:   Positive for frequency.  Neurological:  Positive for headaches.  Endo/Heme/Allergies:  Positive for polydipsia.  Psychiatric/Behavioral:  The patient has insomnia.       Objective:     BP 109/75 (BP Location: Left Arm, Patient Position: Sitting, Cuff Size: Normal)   Pulse 95   Temp 97.9 F (36.6 C)   Ht 5' 10.8" (1.798 m)   Wt 200 lb 6.4 oz (90.9 kg)   SpO2 91%   BMI 28.11 kg/m  BP Readings from Last 3 Encounters:  02/05/22 109/75  08/27/21 133/84  02/26/21 101/69   Wt Readings from Last 3 Encounters:  02/05/22 200 lb 6.4 oz (90.9 kg)  08/27/21 208 lb 14.4 oz (94.8 kg)  02/26/21 214 lb 1.6 oz (97.1 kg)      Physical Exam   No results found for any visits on 02/05/22.  Last CBC Lab Results  Component Value Date   WBC 10.1 08/27/2021   HGB 16.7 08/27/2021   HCT 46.8 08/27/2021   MCV 89 08/27/2021   MCH 31.7 08/27/2021   RDW 13.2 08/27/2021   PLT 220 56/31/4970   Last metabolic panel Lab Results  Component Value Date   GLUCOSE 123 (H) 08/27/2021   NA 140 08/27/2021   K 4.4 08/27/2021   CL 102 08/27/2021   CO2 21 08/27/2021   BUN 15 08/27/2021  CREATININE 0.90 08/27/2021   EGFR 98 08/27/2021   CALCIUM 9.9 08/27/2021   PROT 7.9 08/27/2021   ALBUMIN 4.7 08/27/2021   LABGLOB 3.2 08/27/2021   AGRATIO 1.5 08/27/2021   BILITOT 0.5 08/27/2021   ALKPHOS 91 08/27/2021   AST 21 08/27/2021   ALT 39 08/27/2021   ANIONGAP 10 04/10/2020   Last lipids Lab Results  Component Value Date   CHOL 158 08/27/2021   HDL 41 08/27/2021   LDLCALC 96 08/27/2021   TRIG 116 08/27/2021   CHOLHDL 3.9 08/27/2021   Last hemoglobin A1c Lab Results  Component Value Date   HGBA1C 6.3 (H) 08/27/2021   Last thyroid functions Lab Results  Component Value Date   TSH 1.770 08/27/2021   T4TOTAL 9.1 03/06/2020      The 10-year ASCVD risk score (Arnett DK, et al., 2019) is: 20.7%    Assessment & Plan:   Problem List Items Addressed This Visit   None   No  follow-ups on file.    Avier Jech Jerold Coombe, NP

## 2022-02-05 NOTE — Patient Instructions (Signed)

## 2022-02-06 ENCOUNTER — Other Ambulatory Visit: Payer: Self-pay

## 2022-02-10 DIAGNOSIS — Z636 Dependent relative needing care at home: Secondary | ICD-10-CM | POA: Insufficient documentation

## 2022-02-12 ENCOUNTER — Ambulatory Visit: Payer: Self-pay | Admitting: Nurse Practitioner

## 2022-02-12 VITALS — BP 128/88 | HR 92 | Ht 70.0 in | Wt 201.0 lb

## 2022-02-12 DIAGNOSIS — I10 Essential (primary) hypertension: Secondary | ICD-10-CM

## 2022-02-12 DIAGNOSIS — R519 Headache, unspecified: Secondary | ICD-10-CM

## 2022-02-12 DIAGNOSIS — E119 Type 2 diabetes mellitus without complications: Secondary | ICD-10-CM

## 2022-02-12 MED ORDER — INSULIN LISPRO (1 UNIT DIAL) 100 UNIT/ML (KWIKPEN)
2.0000 [IU] | PEN_INJECTOR | Freq: Three times a day (TID) | SUBCUTANEOUS | 11 refills | Status: DC
  Filled 2022-02-12: qty 10, 30d supply, fill #0
  Filled 2022-02-13: qty 10, 28d supply, fill #0
  Filled 2022-02-13: qty 15, 42d supply, fill #0
  Filled 2022-04-06: qty 15, 42d supply, fill #1
  Filled 2022-06-15: qty 15, 42d supply, fill #2
  Filled 2022-08-13: qty 15, 42d supply, fill #3

## 2022-02-12 NOTE — Progress Notes (Signed)
Established Patient Office Visit  Subjective   Patient ID: Roy Norton, male    DOB: 1961/07/04  Age: 61 y.o. MRN: [id number]  Chief Complaint  Patient presents with   Diabetes    HPI  Roy Norton is a 61 year old male who has history of type 2 diabetes, GERD, hyperlipidemia, hypertension, stroke and presents for follow up diabetes management.  A1C done on 02/05/22 14.0.  He states that he checks his blood glucose at night and it averages 500 mg/dl. He states that he's compliant with his medications, reports hyperglycemic symptoms and denies peripheral neuropathy. Patient with complaints of a headache., requests eye exams and not getting enough sleep at night because he cares for his step father that has worsening dementia. Overall, he states that he's doing well and offers no further complaint.   Review of Systems  Constitutional:  Negative for chills, fever, malaise/fatigue and weight loss.  HENT:  Negative for congestion, hearing loss and sore throat.   Eyes:  Negative for blurred vision, double vision and photophobia.  Respiratory:  Negative for shortness of breath.   Cardiovascular:  Negative for chest pain.  Gastrointestinal:  Negative for abdominal pain, blood in stool, constipation, diarrhea, heartburn, nausea and vomiting.  Genitourinary:  Negative for dysuria and frequency.  Musculoskeletal:  Negative for back pain, joint pain and neck pain.  Skin:  Negative for itching and rash.  Neurological:  Positive for headaches. Negative for dizziness and weakness.  Endo/Heme/Allergies:  Does not bruise/bleed easily.  Psychiatric/Behavioral:  Negative for depression, substance abuse and suicidal ideas.       Objective:     BP 128/88   Pulse 92   Ht 5' 10"  (1.778 m)   Wt 201 lb (91.2 kg)   BMI 28.84 kg/m  BP Readings from Last 3 Encounters:  02/12/22 128/88  02/05/22 109/75  08/27/21 133/84   Wt Readings from Last 3 Encounters:  02/12/22 201 lb (91.2 kg)  02/05/22  200 lb 6.4 oz (90.9 kg)  08/27/21 208 lb 14.4 oz (94.8 kg)      Physical Exam Constitutional:      Appearance: Normal appearance.  HENT:     Head: Normocephalic.  Cardiovascular:     Rate and Rhythm: Normal rate and regular rhythm.  Pulmonary:     Effort: Pulmonary effort is normal.     Breath sounds: Normal breath sounds.  Abdominal:     General: Abdomen is flat. Bowel sounds are normal.     Palpations: Abdomen is soft.  Musculoskeletal:     Cervical back: Full passive range of motion without pain, normal range of motion and neck supple.  Skin:    General: Skin is warm and dry.  Neurological:     Mental Status: He is alert.  Psychiatric:        Behavior: Behavior is cooperative.      No results found for any visits on 02/12/22.  Last CBC Lab Results  Component Value Date   WBC 10.1 08/27/2021   HGB 16.7 08/27/2021   HCT 46.8 08/27/2021   MCV 89 08/27/2021   MCH 31.7 08/27/2021   RDW 13.2 08/27/2021   PLT 220 40/81/4481   Last metabolic panel Lab Results  Component Value Date   GLUCOSE 123 (H) 08/27/2021   NA 140 08/27/2021   K 4.4 08/27/2021   CL 102 08/27/2021   CO2 21 08/27/2021   BUN 15 08/27/2021   CREATININE 0.90 08/27/2021   EGFR 98 08/27/2021  CALCIUM 9.9 08/27/2021   PROT 7.9 08/27/2021   ALBUMIN 4.7 08/27/2021   LABGLOB 3.2 08/27/2021   AGRATIO 1.5 08/27/2021   BILITOT 0.5 08/27/2021   ALKPHOS 91 08/27/2021   AST 21 08/27/2021   ALT 39 08/27/2021   ANIONGAP 10 04/10/2020   Last lipids Lab Results  Component Value Date   CHOL 158 08/27/2021   HDL 41 08/27/2021   LDLCALC 96 08/27/2021   TRIG 116 08/27/2021   CHOLHDL 3.9 08/27/2021   Last hemoglobin A1c Lab Results  Component Value Date   HGBA1C 14.0 (A) 02/05/2022      The 10-year ASCVD risk score (Arnett DK, et al., 2019) is: 26.6%    Assessment & Plan:   1. Diabetes mellitus without complication [facility]) -61 year old male in clinic today for diabetes follow-up.  Patient  was seen on 02/05/22.  A1C on 02/05/22 was 14.0.  Patient given Insulin Glargine 13 units.  Blood sugar at visit today is 499.  Reports at home blood sugars are running around 500.  Patient reports only checking sugar once a day.  Reports compliance with medication.   -Will start patient today on Humalog sliding scale.  Patient provided education and instruction of use. -Patient also advised to check sugars TID.  Patient provided with blood sugar log and advised to present at next visit in one week.   -Reviewed food history with patient.  Reviewed with patient foods that are low in sugar and carbs.   -Recommend patient to drink at least 6-8 bottle of water daily and exercise at least three times a week for 30 minutes to one hour.   -Routine blood work along with assessment of kidney function.    Exercise: walks  10 minutes per day, walking the dog  Diet recall :   Breakfast =  2 eggs, 2 toast, one cup black  coffee                      Lunch = None                       Dinner = 02/11/22: Baked chicken, potato, green beans                       Snack = 02/11/22: Banana  Water: drinks about  0 bottles of water/day and night    - Urine Microalbumin w/creat. ratio; Future - POCT Glucose (CBG); Future - Urine Microalbumin w/creat. ratio - Urinalysis, Routine w reflex microscopic - HgB A1c - CBC w/Diff - Comp Met (CMET) - Glutamic acid decarboxylase auto abs; Future - C-peptide; Future - insulin lispro (HUMALOG) 100 UNIT/ML injection; Inject 0.02 mLs (2 Units total) into the skin 3 (three) times daily with meals. 0-149 = 0 units 150-199= 2 units  200-249= 4 units 250-299 = 6 units 300-349= 8 units 350-399= 10 units 400-449= 12 units and please call clinic to report sugar and insulin taken  Dispense: 10 mL; Refill: 11  - C-peptide - Glutamic acid decarboxylase auto abs  2. Essential hypertension -BP normal today 128/88.  Continue with medication regimen.    - Urinalysis, Routine w reflex  microscopic - CBC w/Diff - Comp Met (CMET)    3. Nonintractable headache, unspecified chronicity pattern, unspecified headache type -Encouraged patient to get proper rest, drink plenty water, eat small frequent meals high in protein, and take Tylenol as needed.  Patient also provided with a resource  sheet for free retina eye exam for diabetic patients on 03/03/22.    Return in about 1 week (around 02/19/2022).    Gregary Cromer, FNP

## 2022-02-13 ENCOUNTER — Other Ambulatory Visit: Payer: Self-pay

## 2022-02-15 LAB — C-PEPTIDE: C-Peptide: 5.4 ng/mL — ABNORMAL HIGH (ref 1.1–4.4)

## 2022-02-15 LAB — GLUTAMIC ACID DECARBOXYLASE AUTO ABS: Glutamic Acid Decarb Ab: <5 U/mL (ref 0.0–5.0)

## 2022-02-17 ENCOUNTER — Other Ambulatory Visit: Payer: Self-pay

## 2022-02-17 LAB — URINALYSIS, ROUTINE W REFLEX MICROSCOPIC
Bilirubin, UA: NEGATIVE
Ketones, UA: NEGATIVE
Leukocytes,UA: NEGATIVE
Nitrite, UA: NEGATIVE
Protein,UA: NEGATIVE
RBC, UA: NEGATIVE
Specific Gravity, UA: >=1.03 — AB (ref 1.005–1.030)
Urobilinogen, Ur: 0.2 mg/dL (ref 0.2–1.0)
pH, UA: 6 (ref 5.0–7.5)

## 2022-02-17 LAB — COMPREHENSIVE METABOLIC PANEL
ALT: 34 IU/L (ref 0–44)
AST: 18 IU/L (ref 0–40)
Albumin/Globulin Ratio: 1.7 (ref 1.2–2.2)
Albumin: 4.3 g/dL (ref 3.8–4.9)
Alkaline Phosphatase: 101 IU/L (ref 44–121)
BUN/Creatinine Ratio: 10 (ref 10–24)
BUN: 9 mg/dL (ref 8–27)
Bilirubin Total: 0.3 mg/dL (ref 0.0–1.2)
CO2: 21 mmol/L (ref 20–29)
Calcium: 9.5 mg/dL (ref 8.6–10.2)
Chloride: 99 mmol/L (ref 96–106)
Creatinine, Ser: 0.86 mg/dL (ref 0.76–1.27)
Globulin, Total: 2.6 g/dL (ref 1.5–4.5)
Glucose: 493 mg/dL — ABNORMAL HIGH (ref 70–99)
Potassium: 4.1 mmol/L (ref 3.5–5.2)
Sodium: 135 mmol/L (ref 134–144)
Total Protein: 6.9 g/dL (ref 6.0–8.5)
eGFR: 99 mL/min/{1.73_m2} (ref >59)

## 2022-02-17 LAB — CBC WITH DIFFERENTIAL/PLATELET
Basophils Absolute: 0 10*3/uL (ref 0.0–0.2)
Basos: 0 %
EOS (ABSOLUTE): 0.1 10*3/uL (ref 0.0–0.4)
Eos: 1 %
Hematocrit: 46.7 % (ref 37.5–51.0)
Hemoglobin: 15.5 g/dL (ref 13.0–17.7)
Immature Grans (Abs): 0 10*3/uL (ref 0.0–0.1)
Immature Granulocytes: 0 %
Lymphocytes Absolute: 2.2 10*3/uL (ref 0.7–3.1)
Lymphs: 28 %
MCH: 30.9 pg (ref 26.6–33.0)
MCHC: 33.2 g/dL (ref 31.5–35.7)
MCV: 93 fL (ref 79–97)
Monocytes Absolute: 0.4 10*3/uL (ref 0.1–0.9)
Monocytes: 5 %
Neutrophils Absolute: 4.9 10*3/uL (ref 1.4–7.0)
Neutrophils: 66 %
Platelets: 201 10*3/uL (ref 150–450)
RBC: 5.02 x10E6/uL (ref 4.14–5.80)
RDW: 12.5 % (ref 11.6–15.4)
WBC: 7.6 10*3/uL (ref 3.4–10.8)

## 2022-02-17 LAB — HEMOGLOBIN A1C
Est. average glucose Bld gHb Est-mCnc: 346 mg/dL
Hgb A1c MFr Bld: 13.7 % — ABNORMAL HIGH (ref 4.8–5.6)

## 2022-02-17 LAB — MICROALBUMIN / CREATININE URINE RATIO
Creatinine, Urine: 40.7 mg/dL
Microalb/Creat Ratio: <7 mg/g creat (ref 0–29)
Microalbumin, Urine: <3 ug/mL

## 2022-02-25 ENCOUNTER — Other Ambulatory Visit: Payer: Self-pay

## 2022-02-26 ENCOUNTER — Other Ambulatory Visit: Payer: Self-pay

## 2022-02-26 DIAGNOSIS — E119 Type 2 diabetes mellitus without complications: Secondary | ICD-10-CM

## 2022-02-26 NOTE — Progress Notes (Unsigned)
cbc

## 2022-02-27 LAB — URINALYSIS, ROUTINE W REFLEX MICROSCOPIC
Bilirubin, UA: NEGATIVE
Ketones, UA: NEGATIVE
Leukocytes,UA: NEGATIVE
Nitrite, UA: NEGATIVE
Protein,UA: NEGATIVE
RBC, UA: NEGATIVE
Specific Gravity, UA: >=1.03 — AB (ref 1.005–1.030)
Urobilinogen, Ur: 0.2 mg/dL (ref 0.2–1.0)
pH, UA: 5.5 (ref 5.0–7.5)

## 2022-02-27 LAB — COMPREHENSIVE METABOLIC PANEL
ALT: 35 IU/L (ref 0–44)
AST: 18 IU/L (ref 0–40)
Albumin/Globulin Ratio: 1.6 (ref 1.2–2.2)
Albumin: 4.6 g/dL (ref 3.8–4.9)
Alkaline Phosphatase: 97 IU/L (ref 44–121)
BUN/Creatinine Ratio: 13 (ref 10–24)
BUN: 10 mg/dL (ref 8–27)
Bilirubin Total: 0.4 mg/dL (ref 0.0–1.2)
CO2: 21 mmol/L (ref 20–29)
Calcium: 9.4 mg/dL (ref 8.6–10.2)
Chloride: 102 mmol/L (ref 96–106)
Creatinine, Ser: 0.8 mg/dL (ref 0.76–1.27)
Globulin, Total: 2.9 g/dL (ref 1.5–4.5)
Glucose: 288 mg/dL — ABNORMAL HIGH (ref 70–99)
Potassium: 4.2 mmol/L (ref 3.5–5.2)
Sodium: 139 mmol/L (ref 134–144)
Total Protein: 7.5 g/dL (ref 6.0–8.5)
eGFR: 101 mL/min/{1.73_m2} (ref >59)

## 2022-02-27 LAB — CBC WITH DIFFERENTIAL/PLATELET
Basophils Absolute: 0 10*3/uL (ref 0.0–0.2)
Basos: 0 %
EOS (ABSOLUTE): 0.1 10*3/uL (ref 0.0–0.4)
Eos: 1 %
Hematocrit: 48 % (ref 37.5–51.0)
Hemoglobin: 16.4 g/dL (ref 13.0–17.7)
Immature Grans (Abs): 0 10*3/uL (ref 0.0–0.1)
Immature Granulocytes: 0 %
Lymphocytes Absolute: 2.6 10*3/uL (ref 0.7–3.1)
Lymphs: 33 %
MCH: 31.4 pg (ref 26.6–33.0)
MCHC: 34.2 g/dL (ref 31.5–35.7)
MCV: 92 fL (ref 79–97)
Monocytes Absolute: 0.5 10*3/uL (ref 0.1–0.9)
Monocytes: 6 %
Neutrophils Absolute: 4.7 10*3/uL (ref 1.4–7.0)
Neutrophils: 60 %
Platelets: 230 10*3/uL (ref 150–450)
RBC: 5.23 x10E6/uL (ref 4.14–5.80)
RDW: 12.6 % (ref 11.6–15.4)
WBC: 7.9 10*3/uL (ref 3.4–10.8)

## 2022-03-04 ENCOUNTER — Ambulatory Visit: Payer: Self-pay | Admitting: Gerontology

## 2022-03-05 ENCOUNTER — Ambulatory Visit: Payer: Self-pay | Admitting: Gerontology

## 2022-03-05 VITALS — BP 100/73 | HR 100 | Temp 98.0°F | Resp 18 | Ht 70.0 in | Wt 202.1 lb

## 2022-03-05 DIAGNOSIS — E119 Type 2 diabetes mellitus without complications: Secondary | ICD-10-CM

## 2022-03-05 LAB — GLUCOSE, POCT (MANUAL RESULT ENTRY): POC Glucose: 496 mg/dl — AB (ref 70–99)

## 2022-03-05 NOTE — Patient Instructions (Signed)

## 2022-03-05 NOTE — Progress Notes (Signed)
Established Patient Office Visit  Subjective   Patient ID: Roy Norton, male    DOB: 1961-03-24  Age: 61 y.o. MRN: [id number]  Chief Complaint  Patient presents with   Follow-up    Diabetes - follow up     HPI  Roy Norton is a 61 year old male who has history of type 2 diabetes, GERD, hyperlipidemia, hypertension, stroke and presents for follow up diabetes management. His HgbA1c done on 02/12/22 was 13.7%, he checks his blood glucose tid. His fasting reading for 1 week ranges between 227 - 359 mg/dl, pre lunch was 188 - 332 mg/dl  and pre dinner was 209-310 mg/dl. He is not compliant with taking his meal time insulin, states that he takes 1 unit and has not been taking Basaglar 13 units at night. His POCT finger stick reading was 496 mg/dl. He denies hypoglycemic symptoms, admits to hyperglycemic symptoms, performs daily foot checks. His labs were unremarkable except for 3+ glucose in his urine. Overall, he states that he's doing well and offers no further complaint.  Review of Systems  Constitutional: Negative.   Eyes: Negative.   Respiratory: Negative.    Cardiovascular: Negative.   Neurological: Negative.   Endo/Heme/Allergies:  Negative for polydipsia.  Psychiatric/Behavioral: Negative.        Objective:     BP 100/73 (BP Location: Right Arm)   Pulse 100   Temp 98 F (36.7 C) (Oral)   Resp 18   Ht _0  (1.778 m)   Wt 202 lb 1.6 oz (91.7 kg)   SpO2 92%   BMI 29.00 kg/m  BP Readings from Last 3 Encounters:  03/05/22 100/73  02/12/22 128/88  02/05/22 109/75   Wt Readings from Last 3 Encounters:  03/05/22 202 lb 1.6 oz (91.7 kg)  02/12/22 201 lb (91.2 kg)  02/05/22 200 lb 6.4 oz (90.9 kg)      Physical Exam HENT:     Head: Normocephalic and atraumatic.     Mouth/Throat:     Mouth: Mucous membranes are moist.  Eyes:     Extraocular Movements: Extraocular movements intact.     Conjunctiva/sclera: Conjunctivae normal.     Pupils: Pupils are equal,  round, and reactive to light.  Cardiovascular:     Rate and Rhythm: Normal rate and regular rhythm.     Pulses: Normal pulses.     Heart sounds: Normal heart sounds.  Pulmonary:     Effort: Pulmonary effort is normal.     Breath sounds: Normal breath sounds.  Skin:    General: Skin is warm.  Neurological:     General: No focal deficit present.     Mental Status: He is alert and oriented to person, place, and time. Mental status is at baseline.  Psychiatric:        Mood and Affect: Mood normal.        Behavior: Behavior normal.        Thought Content: Thought content normal.        Judgment: Judgment normal.      Results for orders placed or performed in visit on 03/05/22  POCT Glucose (CBG)  Result Value Ref Range   POC Glucose 496 (A) 70 - 99 mg/dl    Last CBC Lab Results  Component Value Date   WBC 7.9 02/26/2022   HGB 16.4 02/26/2022   HCT 48.0 02/26/2022   MCV 92 02/26/2022   MCH 31.4 02/26/2022   RDW 12.6 02/26/2022   PLT 230 02/26/2022  Last metabolic panel Lab Results  Component Value Date   GLUCOSE 288 (H) 02/26/2022   NA 139 02/26/2022   K 4.2 02/26/2022   CL 102 02/26/2022   CO2 21 02/26/2022   BUN 10 02/26/2022   CREATININE 0.80 02/26/2022   EGFR 101 02/26/2022   CALCIUM 9.4 02/26/2022   PROT 7.5 02/26/2022   ALBUMIN 4.6 02/26/2022   LABGLOB 2.9 02/26/2022   AGRATIO 1.6 02/26/2022   BILITOT 0.4 02/26/2022   ALKPHOS 97 02/26/2022   AST 18 02/26/2022   ALT 35 02/26/2022   ANIONGAP 10 04/10/2020   Last lipids Lab Results  Component Value Date   CHOL 158 08/27/2021   HDL 41 08/27/2021   LDLCALC 96 08/27/2021   TRIG 116 08/27/2021   CHOLHDL 3.9 08/27/2021   Last hemoglobin A1c Lab Results  Component Value Date   HGBA1C 13.7 (H) 02/12/2022      The 10-year ASCVD risk score (Arnett DK, et al., 2019) is: 18%    Assessment & Plan:   1. Diabetes mellitus without complication ([place]) - His HgbA1c was 13.7%, his goal should be less than  7%. He educated on which Insulin ( Basaglar 13 units at bedtime) and Insulin Lispro 2 units with sliding scale three times with meal. Her verbalized understanding. His POCT finger stick was 496 mg/dl, he was advised to inject 12 units of Lispro with dinner and 13 units at bedtime. He was encouraged to continue checking his blood glucose tid, record and bring log with his medications to next week appointment. - POCT Glucose (CBG); Future - POCT Glucose (CBG)    Return in about 1 week (around 03/12/2022), or if symptoms worsen or fail to improve.    Dorothia Passmore Jerold Coombe, NP

## 2022-03-10 ENCOUNTER — Other Ambulatory Visit: Payer: Self-pay

## 2022-03-12 ENCOUNTER — Ambulatory Visit: Payer: Self-pay | Admitting: General Practice

## 2022-03-12 VITALS — BP 131/87 | HR 82 | Temp 98.5°F | Resp 15 | Ht 70.0 in | Wt 201.2 lb

## 2022-03-12 DIAGNOSIS — E119 Type 2 diabetes mellitus without complications: Secondary | ICD-10-CM

## 2022-03-12 LAB — GLUCOSE, POCT (MANUAL RESULT ENTRY): POC Glucose: 283 mg/dl — AB (ref 70–99)

## 2022-03-12 MED ORDER — METFORMIN HCL 500 MG PO TABS
1000.0000 mg | ORAL_TABLET | Freq: Two times a day (BID) | ORAL | 1 refills | Status: DC
  Filled 2022-03-12: qty 120, 30d supply, fill #0
  Filled 2022-07-14: qty 120, 30d supply, fill #1

## 2022-03-12 NOTE — Patient Instructions (Signed)
Change Metformin to 1000 mg (2 tablets) twice a day.    Take blood glucose three times a day.  Take insulin as sliding scale insulin as directed.  Continue with lower carb an higher protein foods.   Follow up in 1 month.

## 2022-03-12 NOTE — Progress Notes (Signed)
Established Patient Office Visit  Subjective   Patient ID: Roy Norton, male    DOB: [birth year]  Age: 61 y.o. MRN: [id number]  Chief Complaint  Patient presents with   Diabetes follow up     Pt in clinic for f/u has BG log but did not bring medications to this appointment     HPI  ASLAN HIMES is a 61 year old male who has history of type 2 diabetes, GERD, hyperlipidemia, hypertension, stroke and presents for follow up diabetes management. His HgbA1c done on 02/12/22 was 13.7%, he mostly checks his blood glucose tid. He is not compliant with taking blood sugars TID.  Reports compliance with taking all prescribed medications.   He denies hypoglycemic symptoms, admits to hyperglycemic symptoms, performs daily foot checks.  Overall, he states that he's doing well and offers no further complaint.   Review of Systems  Constitutional: Negative.   Eyes: Negative.   Respiratory: Negative.    Cardiovascular: Negative.   Neurological: Negative.   Endo/Heme/Allergies:  Negative for polydipsia.  Psychiatric/Behavioral: Negative.        Objective:     BP 131/87 (BP Location: Left Arm, Patient Position: Sitting, Cuff Size: Large)   Pulse 82   Temp 98.5 F (36.9 C)   Resp 15   Ht 5\' 10"  (1.778 m)   Wt 201 lb 3.2 oz (91.3 kg)   SpO2 100%   BMI 28.87 kg/m    Physical Exam Constitutional:      Appearance: Normal appearance. He is obese.  HENT:     Head: Normocephalic.  Cardiovascular:     Rate and Rhythm: Normal rate and regular rhythm.     Pulses: Normal pulses.     Heart sounds: Normal heart sounds.  Pulmonary:     Effort: Pulmonary effort is normal.     Breath sounds: Normal breath sounds.  Abdominal:     General: Bowel sounds are normal.  Musculoskeletal:        General: Normal range of motion.  Skin:    General: Skin is warm and dry.  Neurological:     Mental Status: He is alert and oriented to person, place, and time.  Psychiatric:        Behavior: Behavior  normal.      Results for orders placed or performed in visit on 03/12/22  POCT Glucose (CBG)  Result Value Ref Range   POC Glucose 283 (A) 70 - 99 mg/dl    Last hemoglobin 03/14/22 Lab Results  Component Value Date   HGBA1C 13.7 (H) 02/12/2022      The 10-year ASCVD risk score (Arnett DK, et al., 2019) is: 27.5%    Assessment & Plan:  1. Diabetes mellitus without complication (HCC)   Follow up  Diabetes and medication management.  Pt last Hgb A1c was 13.7%, his goal should be less than 7%.  Assessed blood sugar log,  Pt has No blood glucoses in range.   Fasting Blood sugars range between 236 -505 and PP blood sugars range 208-372.  Pt has several days of not taking BG, reporting "I am tired of sticking myself"  Reinforced education on importance of taking BG and which Insulin ( Basaglar 13 units at bedtime) and Insulin Lispro 2 units with sliding scale three times with meals.    Discussed increasing Metformin to 1000mg  BID. Pt verbalized understanding of changes in medication. His POCT finger stick was 283 mg/dl in clinic today, and this is fasting, pt reports not eating  a meal today and will eat dinner.      He was encouraged to continue checking his blood glucose tid, record and bring log with his medications to next months appointment.    - POCT Glucose (CBG) - metFORMIN (GLUCOPHAGE) 500 MG tablet; Take 2 tablets (1,000 mg total) by mouth 2 (two) times daily with a meal.  Dispense: 120 tablet; Refill: 1   Return in about 1 month (around 04/12/2022) for Diabetes follow up .    Wendi Snipes, FNP

## 2022-03-13 ENCOUNTER — Other Ambulatory Visit: Payer: Self-pay

## 2022-04-02 ENCOUNTER — Other Ambulatory Visit: Payer: Self-pay | Admitting: Gerontology

## 2022-04-02 DIAGNOSIS — I10 Essential (primary) hypertension: Secondary | ICD-10-CM

## 2022-04-02 DIAGNOSIS — E119 Type 2 diabetes mellitus without complications: Secondary | ICD-10-CM

## 2022-04-02 MED ORDER — METOPROLOL TARTRATE 25 MG PO TABS: ORAL_TABLET | Freq: Two times a day (BID) | ORAL | 0 refills | Status: DC

## 2022-04-02 MED ORDER — GABAPENTIN 300 MG PO CAPS: ORAL_CAPSULE | Freq: Two times a day (BID) | ORAL | 2 refills | Status: DC

## 2022-04-06 ENCOUNTER — Other Ambulatory Visit: Payer: Self-pay

## 2022-04-09 ENCOUNTER — Ambulatory Visit: Payer: Self-pay

## 2022-04-16 ENCOUNTER — Ambulatory Visit: Payer: Self-pay | Admitting: General Practice

## 2022-04-16 DIAGNOSIS — E119 Type 2 diabetes mellitus without complications: Secondary | ICD-10-CM

## 2022-04-16 MED ORDER — BASAGLAR KWIKPEN 100 UNIT/ML ~~LOC~~ SOPN
16.0000 [IU] | PEN_INJECTOR | Freq: Every day | SUBCUTANEOUS | 0 refills | Status: DC
  Filled 2022-04-16: qty 3, 18d supply, fill #0
  Filled 2022-05-05: qty 15, 93d supply, fill #0

## 2022-04-16 NOTE — Progress Notes (Signed)
Established Patient Office Visit  Subjective   Patient ID: Roy Norton, male    DOB: October 15, 1960  Age: 61 y.o. MRN: [id number]  Chief Complaint  Patient presents with   Blood Sugar Problem    It has been going up and down. Has been about 183 at morning, lunch, and dinner. He takes insulin injections. Started injections about a month ago. He has been asymptomatic. He has a record of his blood sugar levels with him.    HPI  Roy Norton is a 61 year old male who has history of type 2 diabetes, GERD, hyperlipidemia, hypertension, stroke and presents for follow up diabetes management. His HgbA1c done on 02/12/22 was 13.7%, he mostly checks his blood glucose tid. He is not compliant with taking blood sugars TID.  Reports compliance with taking all prescribed medications.   He denies hypoglycemic symptoms, admits to hyperglycemic symptoms, performs daily foot checks.  Overall, he states that he's doing well and offers no further complaint.  Review of Systems  Constitutional: Negative.   Eyes:  Negative for blurred vision and double vision.  Respiratory:  Negative for shortness of breath and wheezing.   Skin: Negative.   Neurological:  Positive for headaches. Negative for dizziness, tingling, seizures and weakness.  Endo/Heme/Allergies:  Negative for polydipsia. Does not bruise/bleed easily.      Objective:     BP (!) 144/104 (BP Location: Right Arm, Patient Position: Sitting, Cuff Size: Normal)   Pulse (!) 102   Temp 98 F (36.7 C)   Ht 5\' 9"  (1.753 m)   Wt 204 lb 3.2 oz (92.6 kg)   SpO2 95%   BMI 30.16 kg/m  BP Readings from Last 3 Encounters:  04/16/22 (!) 144/104  03/12/22 131/87  03/05/22 100/73      Physical Exam Constitutional:      Appearance: He is obese.  HENT:     Head: Normocephalic and atraumatic.  Cardiovascular:     Rate and Rhythm: Normal rate and regular rhythm.     Pulses: Normal pulses.     Heart sounds: Normal heart sounds.  Pulmonary:      Effort: Pulmonary effort is normal.     Breath sounds: Normal breath sounds.  Skin:    General: Skin is warm and dry.  Neurological:     Mental Status: He is alert and oriented to person, place, and time.  Psychiatric:        Mood and Affect: Mood normal.        Behavior: Behavior normal.      No results found for any visits on 04/16/22.   Last hemoglobin A1c Lab Results  Component Value Date   HGBA1C 13.7 (H) 02/12/2022      The 10-year ASCVD risk score (Arnett DK, et al., 2019) is: 31.7%    Assessment & Plan:   Problem List Items Addressed This Visit       Endocrine   Diabetes mellitus without complication (HCC)   Relevant Medications   Insulin Glargine (BASAGLAR KWIKPEN) 100 UNIT/ML   Pt in clinic today for DM management follow up.    Reviewed blood glucose log pt provided blood sugar checks from 7/30-8/14, no current log.  Blood sugars fasting range from : 183-514, PP range from 157-442.  Pt reports that he is taking all medications as directed and is eating healthier, but he is not exercising, d/t having to care fo his step-father.  Did food review, pt reports meals include "cheerios and milk for  breakfast, peanut butter or ham sandwich and potato chips for lunch, potatoes and some meat but not pork for dinner"  and that is taking 13 units after meals.     Discusses in detail with patient on how to take long acting insulin vs sliding scale.    Discussed with pt about changing bedtime inulin to 16 units.  Pt verbalizes understanding.    Monofilament testing completed : negative Information given on up coming diabetic eye exams on  05/27/22.     Anticipated guidance of next visit.  Bring all medications to visit, pt needs further education on taking insulin and updated blood sugar log.  Will do labs at this appointment.    Pt reports HA currently d/t hungry, he as not eaten since lunch time. Encourage patient to decrease salt and soft drink  including diet and  increase water.  Discussed elevated blood pressure today.  Rates HA at 4/10. Denies any vision changes or dizziness with HA.       Return in about 4 weeks (around 05/14/2022), or if symptoms worsen or fail to improve.    Wendi Snipes, FNP

## 2022-04-16 NOTE — Patient Instructions (Signed)

## 2022-04-17 ENCOUNTER — Other Ambulatory Visit: Payer: Self-pay

## 2022-05-05 ENCOUNTER — Other Ambulatory Visit: Payer: Self-pay

## 2022-05-06 ENCOUNTER — Other Ambulatory Visit: Payer: Self-pay

## 2022-05-07 ENCOUNTER — Other Ambulatory Visit: Payer: Self-pay

## 2022-05-08 ENCOUNTER — Other Ambulatory Visit: Payer: Self-pay

## 2022-05-12 ENCOUNTER — Other Ambulatory Visit: Payer: Self-pay

## 2022-05-14 ENCOUNTER — Ambulatory Visit: Payer: Self-pay | Admitting: General Practice

## 2022-05-14 VITALS — BP 137/92 | HR 87 | Temp 98.1°F | Resp 16 | Ht 70.0 in | Wt 209.0 lb

## 2022-05-14 DIAGNOSIS — E119 Type 2 diabetes mellitus without complications: Secondary | ICD-10-CM

## 2022-05-14 DIAGNOSIS — R062 Wheezing: Secondary | ICD-10-CM

## 2022-05-14 LAB — POCT GLYCOSYLATED HEMOGLOBIN (HGB A1C): Hemoglobin A1C: 11.4 % — AB (ref 4.0–5.6)

## 2022-05-14 NOTE — Progress Notes (Signed)
Established Patient Office Visit  Subjective   Patient ID: Roy Norton, male    DOB: February 05, 1961  Age: 61 y.o. MRN: [id number]  Chief Complaint  Patient presents with   Diabetes    Headache started at 3pm 9/21, took Amitriptyline HCL 50mg . HA improved with medication.     Pt in clinic for DM follow up.  Pt reports taking medication as directed.  Pt reports having HA since 3 pm today and took amitriptyline and it helped with HA.  Pt brought blood sugar log to clinic today.    Fasting blood sugars range from 139-285, with 1 time 329, PP blood sugars range from 113-280, with 1 time 440.   Diabetes He presents for his follow-up diabetic visit. He has type 2 diabetes mellitus. His disease course has been fluctuating. Hypoglycemia symptoms include headaches. Pertinent negatives for hypoglycemia include no confusion, dizziness, hunger, mood changes, nervousness/anxiousness, pallor, seizures, sleepiness, speech difficulty, sweats or tremors. Pertinent negatives for diabetes include no blurred vision, no foot ulcerations and no visual change. There are no hypoglycemic complications. Symptoms are improving. Risk factors for coronary artery disease include family history, diabetes mellitus, obesity, tobacco exposure and sedentary lifestyle. Current diabetic treatment includes diet and insulin injections. He is compliant with treatment most of the time. He is following a diabetic diet. When asked about meal planning, he reported none. He rarely participates in exercise. His home blood glucose trend is decreasing steadily.      Review of Systems  Eyes:  Negative for blurred vision.  Skin:  Negative for pallor.  Neurological:  Positive for headaches. Negative for dizziness, tremors, seizures and speech difficulty.  Psychiatric/Behavioral:  Negative for confusion. The patient is not nervous/anxious.       Objective:     BP (!) 137/92 (BP Location: Left Arm)   Pulse 87   Temp 98.1 F (36.7 C)    Resp 16   Ht 5\' 10"  (1.778 m)   Wt 208 lb 15.9 oz (94.8 kg)   SpO2 95%   BMI 29.99 kg/m    Physical Exam Constitutional:      Appearance: Normal appearance.  HENT:     Head: Normocephalic and atraumatic.  Cardiovascular:     Rate and Rhythm: Normal rate and regular rhythm.     Pulses: Normal pulses.     Heart sounds: Normal heart sounds.  Pulmonary:     Effort: Pulmonary effort is normal.     Breath sounds: Wheezing present.  Abdominal:     General: Bowel sounds are normal.  Musculoskeletal:     Cervical back: Normal range of motion and neck supple.  Skin:    General: Skin is warm and dry.  Neurological:     General: No focal deficit present.     Mental Status: He is alert and oriented to person, place, and time.  Psychiatric:        Mood and Affect: Mood normal.        Behavior: Behavior normal.      Results for orders placed or performed in visit on 05/14/22  POCT HgB A1C  Result Value Ref Range   Hemoglobin A1C 11.4 (A) 4.0 - 5.6 %   HbA1c POC (<> result, manual entry)     HbA1c, POC (prediabetic range)     HbA1c, POC (controlled diabetic range)        The 10-year ASCVD risk score (Arnett DK, et al., 2019) is: 29.5%    Assessment & Plan:  Problem List Items Addressed This Visit       Endocrine   Diabetes mellitus without complication (HCC) - Primary   Relevant Orders   POCT HgB A1C (Completed)   POCT Glucose (CBG)  1. Diabetes mellitus without complication (HCC) Pt blood glucose in 183 today.   Hgb A1C today was 11.4  from 13.7  Reviewed medications with patient and how he is taking them.  Patient is taking 16 units of long acting insulin and 16 units on sliding scale.  Reviewed with patient on how to take sliding scale insulin based up PP blood sugars.  Demonstrated with teach back of units an how to read sliding scale.  Pt reports having HA since ~ 3 pm today.  Pt reports that he took amitriptyline that was prescribed for him. Discussed  hydration with patient to help with HA.  Pt normally drinks 1 bottle of water a day.   Discussed methods to flavor water such as flavored water with fruit.   - POCT HgB A1C; Future - POCT Glucose (CBG); Future - POCT HgB A1C  2. Bilateral wheezing Upon assessment patient heard wheezing.  Pt took two puffs of inhaler in clinic.      Return in about 4 weeks (around 06/11/2022) for DM follow up .    Wendi Snipes, FNP

## 2022-05-28 ENCOUNTER — Other Ambulatory Visit: Payer: Self-pay

## 2022-06-01 ENCOUNTER — Other Ambulatory Visit: Payer: Self-pay | Admitting: Gerontology

## 2022-06-01 ENCOUNTER — Other Ambulatory Visit: Payer: Self-pay

## 2022-06-01 DIAGNOSIS — E1169 Type 2 diabetes mellitus with other specified complication: Secondary | ICD-10-CM

## 2022-06-01 DIAGNOSIS — I10 Essential (primary) hypertension: Secondary | ICD-10-CM

## 2022-06-02 ENCOUNTER — Other Ambulatory Visit: Payer: Self-pay

## 2022-06-02 MED FILL — Atorvastatin Calcium Tab 40 MG (Base Equivalent): ORAL | 30 days supply | Qty: 30 | Fill #0 | Status: AC

## 2022-06-02 MED FILL — Amlodipine Besylate Tab 5 MG (Base Equivalent): ORAL | 30 days supply | Qty: 30 | Fill #0 | Status: AC

## 2022-06-11 ENCOUNTER — Ambulatory Visit: Payer: Self-pay | Admitting: Gerontology

## 2022-06-11 VITALS — BP 119/82 | HR 92 | Temp 98.0°F | Ht 70.0 in | Wt 211.2 lb

## 2022-06-11 DIAGNOSIS — E119 Type 2 diabetes mellitus without complications: Secondary | ICD-10-CM

## 2022-06-11 DIAGNOSIS — Z87898 Personal history of other specified conditions: Secondary | ICD-10-CM

## 2022-06-11 MED ORDER — BASAGLAR KWIKPEN 100 UNIT/ML ~~LOC~~ SOPN
16.0000 [IU] | PEN_INJECTOR | Freq: Every day | SUBCUTANEOUS | 5 refills | Status: DC
  Filled 2022-06-11 – 2022-08-13 (×2): qty 15, 93d supply, fill #0
  Filled 2022-12-24: qty 15, 93d supply, fill #1
  Filled 2023-04-07: qty 15, 93d supply, fill #2

## 2022-06-11 MED ORDER — FLUTICASONE-SALMETEROL 100-50 MCG/ACT IN AEPB
1.0000 | INHALATION_SPRAY | Freq: Two times a day (BID) | RESPIRATORY_TRACT | 3 refills | Status: DC
  Filled 2022-06-11: qty 60, 30d supply, fill #0

## 2022-06-11 NOTE — Progress Notes (Signed)
Established Patient Office Visit  Subjective   Patient ID: Roy Norton, male    DOB: [birth year]  Age: 61 y.o. MRN: [id number]  Chief Complaint  Patient presents with   Diabetes    Follow-up on DM    HPI  Roy Norton is a 61 year old male who has history of type 2 diabetes, GERD, hyperlipidemia, hypertension, stroke and presents for follow up diabetes management. His HgbA1c was 11.4% on 05/14/2022. He has been compliant with is metformin and insulin, and checks his blood glucose three times a day. Over the past week his blood glucose has ranged from 80 to 265 mg/dL. Most readings average below 150 mg/dL. Today his blood glucose was 122, 121, and 138 mg/dL. He denies polyuria, polydipsia, and tingling/numbness in his extremities. He additionally denies any hypoglycemic symptoms (headache, diaphoresis, shaking, nervousness). He has been continuing to make healthy lifestyle changes. He currently uses his albuterol daily. He often wakes up from sleeping wheezing and short of breath. He does currently take any daily maintenance inhalers. Overall, he is doing well and offers no other complaints.    Review of Systems  Constitutional: Negative.   HENT: Negative.    Eyes: Negative.  Negative for blurred vision and double vision.  Respiratory: Negative.    Cardiovascular: Negative.   Gastrointestinal: Negative.   Genitourinary: Negative.   Musculoskeletal: Negative.   Skin: Negative.   Neurological:  Negative for tingling and sensory change.  Endo/Heme/Allergies:  Negative for polydipsia.  Psychiatric/Behavioral: Negative.        Objective:     BP 119/82   Pulse 92   Temp 98 F (36.7 C)   Ht 5' 10"  (1.778 m)   Wt 211 lb 3.2 oz (95.8 kg)   SpO2 92%   BMI 30.30 kg/m  BP Readings from Last 3 Encounters:  06/11/22 119/82  05/14/22 (!) 137/92  04/16/22 (!) 144/104   Wt Readings from Last 3 Encounters:  06/11/22 211 lb 3.2 oz (95.8 kg)  05/14/22 208 lb 15.9 oz (94.8 kg)   04/16/22 204 lb 3.2 oz (92.6 kg)      Physical Exam Constitutional:      Appearance: Normal appearance. He is normal weight.  HENT:     Head: Normocephalic and atraumatic.  Cardiovascular:     Rate and Rhythm: Normal rate and regular rhythm.     Pulses: Normal pulses.     Heart sounds: Normal heart sounds.  Pulmonary:     Effort: Pulmonary effort is normal.     Breath sounds: Examination of the right-upper field reveals wheezing. Wheezing present.  Skin:    General: Skin is warm and dry.  Neurological:     General: No focal deficit present.     Mental Status: He is alert and oriented to person, place, and time. Mental status is at baseline.  Psychiatric:        Mood and Affect: Mood normal.        Behavior: Behavior normal.        Thought Content: Thought content normal.        Judgment: Judgment normal.      No results found for any visits on 06/11/22.  Last CBC Lab Results  Component Value Date   WBC 7.9 02/26/2022   HGB 16.4 02/26/2022   HCT 48.0 02/26/2022   MCV 92 02/26/2022   MCH 31.4 02/26/2022   RDW 12.6 02/26/2022   PLT 230 79/09/4095   Last metabolic panel Lab Results  Component  Value Date   GLUCOSE 288 (H) 02/26/2022   NA 139 02/26/2022   K 4.2 02/26/2022   CL 102 02/26/2022   CO2 21 02/26/2022   BUN 10 02/26/2022   CREATININE 0.80 02/26/2022   EGFR 101 02/26/2022   CALCIUM 9.4 02/26/2022   PROT 7.5 02/26/2022   ALBUMIN 4.6 02/26/2022   LABGLOB 2.9 02/26/2022   AGRATIO 1.6 02/26/2022   BILITOT 0.4 02/26/2022   ALKPHOS 97 02/26/2022   AST 18 02/26/2022   ALT 35 02/26/2022   ANIONGAP 10 04/10/2020   Last lipids Lab Results  Component Value Date   CHOL 158 08/27/2021   HDL 41 08/27/2021   LDLCALC 96 08/27/2021   TRIG 116 08/27/2021   CHOLHDL 3.9 08/27/2021   Last hemoglobin A1c Lab Results  Component Value Date   HGBA1C 11.4 (A) 05/14/2022   Last thyroid functions Lab Results  Component Value Date   TSH 1.770 08/27/2021    T4TOTAL 9.1 03/06/2020      The 10-year ASCVD risk score (Arnett DK, et al., 2019) is: 23.7%    Assessment & Plan:   1. History of shortness of breath - He states he does not feel short of breath today. He had minor wheezing in his right upper anterior lung lobe field. He was prescribed wixela today for maintenance therapy. He was educated on possible medication side effects. He should rinse his mouth with water and spit after inhalation. Report to the ED for worsening shortness of breath.  - fluticasone-salmeterol (WIXELA INHUB) 100-50 MCG/ACT AEPB; Inhale 1 puff into the lungs 2 (two) times daily.  Dispense: 60 each; Refill: 3  2. Diabetes mellitus without complication ([place]) - His diabetes is improving. Continue checking blood glucose three times a day and bring in log to next visit. Low-carb diet and exercise as tolerated. Diabetic foot exam performed without abnormalities.  - Insulin Glargine (BASAGLAR KWIKPEN) 100 UNIT/ML; Inject 16 Units into the skin at bedtime.  Dispense: 15 mL; Refill: 5   Follow-up in two months, around 08/04/2022   Rayvon Char, FNP Student

## 2022-06-11 NOTE — Patient Instructions (Signed)
Carbohydrate Counting for Diabetes Mellitus, Adult Carbohydrate counting is a method of keeping track of how many carbohydrates you eat. Eating carbohydrates increases the amount of sugar (glucose) in the blood. Counting how many carbohydrates you eat improves how well you manage your blood glucose. This, in turn, helps you manage your diabetes. Carbohydrates are measured in grams (g) per serving. It is important to know how many carbohydrates (in grams or by serving size) you can have in each meal. This is different for every person. A dietitian can help you make a meal plan and calculate how many carbohydrates you should have at each meal and snack. What foods contain carbohydrates? Carbohydrates are found in the following foods: Grains, such as breads and cereals. Dried beans and soy products. Starchy vegetables, such as potatoes, peas, and corn. Fruit and fruit juices. Milk and yogurt. Sweets and snack foods, such as cake, cookies, candy, chips, and soft drinks. How do I count carbohydrates in foods? There are two ways to count carbohydrates in food. You can read food labels or learn standard serving sizes of foods. You can use either of these methods or a combination of both. Using the Nutrition Facts label The Nutrition Facts list is included on the labels of almost all packaged foods and beverages in the [place]. It includes: The serving size. Information about nutrients in each serving, including the grams of carbohydrate per serving. To use the Nutrition Facts, decide how many servings you will have. Then, multiply the number of servings by the number of carbohydrates per serving. The resulting number is the total grams of carbohydrates that you will be having. Learning the standard serving sizes of foods When you eat carbohydrate foods that are not packaged or do not include Nutrition Facts on the label, you need to measure the servings in order to count the grams of  carbohydrates. Measure the foods that you will eat with a food scale or measuring cup, if needed. Decide how many standard-size servings you will eat. Multiply the number of servings by 15. For foods that contain carbohydrates, one serving equals 15 g of carbohydrates. For example, if you eat 2 cups or 10 oz (300 g) of strawberries, you will have eaten 2 servings and 30 g of carbohydrates (2 servings x 15 g = 30 g). For foods that have more than one food mixed, such as soups and casseroles, you must count the carbohydrates in each food that is included. The following list contains standard serving sizes of common carbohydrate-rich foods. Each of these servings has about 15 g of carbohydrates: 1 slice of bread. 1 six-inch (15 cm) tortilla. ? cup or 2 oz (53 g) cooked rice or pasta.  cup or 3 oz (85 g) cooked or canned, drained and rinsed beans or lentils.  cup or 3 oz (85 g) starchy vegetable, such as peas, corn, or squash.  cup or 4 oz (120 g) hot cereal.  cup or 3 oz (85 g) boiled or mashed potatoes, or  or 3 oz (85 g) of a large baked potato.  cup or 4 fl oz (118 mL) fruit juice. 1 cup or 8 fl oz (237 mL) milk. 1 small or 4 oz (106 g) apple.  or 2 oz (63 g) of a medium banana. 1 cup or 5 oz (150 g) strawberries. 3 cups or 1 oz (28.3 g) popped popcorn. What is an example of carbohydrate counting? To calculate the grams of carbohydrates in this sample meal, follow the steps   shown below. Sample meal 3 oz (85 g) chicken breast. ? cup or 4 oz (106 g) brown rice.  cup or 3 oz (85 g) corn. 1 cup or 8 fl oz (237 mL) milk. 1 cup or 5 oz (150 g) strawberries with sugar-free whipped topping. Carbohydrate calculation Identify the foods that contain carbohydrates: Rice. Corn. Milk. Strawberries. Calculate how many servings you have of each food: 2 servings rice. 1 serving corn. 1 serving milk. 1 serving strawberries. Multiply each number of servings by 15 g: 2 servings rice x 15  g = 30 g. 1 serving corn x 15 g = 15 g. 1 serving milk x 15 g = 15 g. 1 serving strawberries x 15 g = 15 g. Add together all of the amounts to find the total grams of carbohydrates eaten: 30 g + 15 g + 15 g + 15 g = 75 g of carbohydrates total. What are tips for following this plan? Shopping Develop a meal plan and then make a shopping list. Buy fresh and frozen vegetables, fresh and frozen fruit, dairy, eggs, beans, lentils, and whole grains. Look at food labels. Choose foods that have more fiber and less sugar. Avoid processed foods and foods with added sugars. Meal planning Aim to have the same number of grams of carbohydrates at each meal and for each snack time. Plan to have regular, balanced meals and snacks. Where to find more information American Diabetes Association: diabetes.org Centers for Disease Control and Prevention: cdc.gov Academy of Nutrition and Dietetics: eatright.org Association of Diabetes Care & Education Specialists: diabeteseducator.org Summary Carbohydrate counting is a method of keeping track of how many carbohydrates you eat. Eating carbohydrates increases the amount of sugar (glucose) in your blood. Counting how many carbohydrates you eat improves how well you manage your blood glucose. This helps you manage your diabetes. A dietitian can help you make a meal plan and calculate how many carbohydrates you should have at each meal and snack. This information is not intended to replace advice given to you by your health care provider. Make sure you discuss any questions you have with your health care provider. Document Revised: 03/13/2020 Document Reviewed: 03/13/2020 Elsevier Patient Education  2023 Elsevier Inc. DASH Eating Plan DASH stands for Dietary Approaches to Stop Hypertension. The DASH eating plan is a healthy eating plan that has been shown to: Reduce high blood pressure (hypertension). Reduce your risk for type 2 diabetes, heart disease, and  stroke. Help with weight loss. What are tips for following this plan? Reading food labels Check food labels for the amount of salt (sodium) per serving. Choose foods with less than 5 percent of the Daily Value of sodium. Generally, foods with less than 300 milligrams (mg) of sodium per serving fit into this eating plan. To find whole grains, look for the word "whole" as the first word in the ingredient list. Shopping Buy products labeled as "low-sodium" or "no salt added." Buy fresh foods. Avoid canned foods and pre-made or frozen meals. Cooking Avoid adding salt when cooking. Use salt-free seasonings or herbs instead of table salt or sea salt. Check with your health care provider or pharmacist before using salt substitutes. Do not fry foods. Cook foods using healthy methods such as baking, boiling, grilling, roasting, and broiling instead. Cook with heart-healthy oils, such as olive, canola, avocado, soybean, or sunflower oil. Meal planning  Eat a balanced diet that includes: 4 or more servings of fruits and 4 or more servings of vegetables each day.   Try to fill one-half of your plate with fruits and vegetables. 6-8 servings of whole grains each day. Less than 6 oz (170 g) of lean meat, poultry, or fish each day. A 3-oz (85-g) serving of meat is about the same size as a deck of cards. One egg equals 1 oz (28 g). 2-3 servings of low-fat dairy each day. One serving is 1 cup (237 mL). 1 serving of nuts, seeds, or beans 5 times each week. 2-3 servings of heart-healthy fats. Healthy fats called omega-3 fatty acids are found in foods such as walnuts, flaxseeds, fortified milks, and eggs. These fats are also found in cold-water fish, such as sardines, salmon, and mackerel. Limit how much you eat of: Canned or prepackaged foods. Food that is high in trans fat, such as some fried foods. Food that is high in saturated fat, such as fatty meat. Desserts and other sweets, sugary drinks, and other foods  with added sugar. Full-fat dairy products. Do not salt foods before eating. Do not eat more than 4 egg yolks a week. Try to eat at least 2 vegetarian meals a week. Eat more home-cooked food and less restaurant, buffet, and fast food. Lifestyle When eating at a restaurant, ask that your food be prepared with less salt or no salt, if possible. If you drink alcohol: Limit how much you use to: 0-1 drink a day for women who are not pregnant. 0-2 drinks a day for men. Be aware of how much alcohol is in your drink. In the [place] one drink equals one 12 oz bottle of beer (355 mL), one 5 oz glass of wine (148 mL), or one 1 oz glass of hard liquor (44 mL). General information Avoid eating more than 2,300 mg of salt a day. If you have hypertension, you may need to reduce your sodium intake to 1,500 mg a day. Work with your health care provider to maintain a healthy body weight or to lose weight. Ask what an ideal weight is for you. Get at least 30 minutes of exercise that causes your heart to beat faster (aerobic exercise) most days of the week. Activities may include walking, swimming, or biking. Work with your health care provider or dietitian to adjust your eating plan to your individual calorie needs. What foods should I eat? Fruits All fresh, dried, or frozen fruit. Canned fruit in natural juice (without added sugar). Vegetables Fresh or frozen vegetables (raw, steamed, roasted, or grilled). Low-sodium or reduced-sodium tomato and vegetable juice. Low-sodium or reduced-sodium tomato sauce and tomato paste. Low-sodium or reduced-sodium canned vegetables. Grains Whole-grain or whole-wheat bread. Whole-grain or whole-wheat pasta. Brown rice. Oatmeal. Quinoa. Bulgur. Whole-grain and low-sodium cereals. Pita bread. Low-fat, low-sodium crackers. Whole-wheat flour tortillas. Meats and other proteins Skinless chicken or turkey. Ground chicken or turkey. Pork with fat trimmed off. Fish and seafood. Egg  whites. Dried beans, peas, or lentils. Unsalted nuts, nut butters, and seeds. Unsalted canned beans. Lean cuts of beef with fat trimmed off. Low-sodium, lean precooked or cured meat, such as sausages or meat loaves. Dairy Low-fat (1%) or fat-free (skim) milk. Reduced-fat, low-fat, or fat-free cheeses. Nonfat, low-sodium ricotta or cottage cheese. Low-fat or nonfat yogurt. Low-fat, low-sodium cheese. Fats and oils Soft margarine without trans fats. Vegetable oil. Reduced-fat, low-fat, or light mayonnaise and salad dressings (reduced-sodium). Canola, safflower, olive, avocado, soybean, and sunflower oils. Avocado. Seasonings and condiments Herbs. Spices. Seasoning mixes without salt. Other foods Unsalted popcorn and pretzels. Fat-free sweets. The items listed above may not be   a complete list of foods and beverages you can eat. Contact a dietitian for more information. What foods should I avoid? Fruits Canned fruit in a light or heavy syrup. Fried fruit. Fruit in cream or butter sauce. Vegetables Creamed or fried vegetables. Vegetables in a cheese sauce. Regular canned vegetables (not low-sodium or reduced-sodium). Regular canned tomato sauce and paste (not low-sodium or reduced-sodium). Regular tomato and vegetable juice (not low-sodium or reduced-sodium). Pickles. Olives. Grains Baked goods made with fat, such as croissants, muffins, or some breads. Dry pasta or rice meal packs. Meats and other proteins Fatty cuts of meat. Ribs. Fried meat. Bacon. Bologna, salami, and other precooked or cured meats, such as sausages or meat loaves. Fat from the back of a pig (fatback). Bratwurst. Salted nuts and seeds. Canned beans with added salt. Canned or smoked fish. Whole eggs or egg yolks. Chicken or turkey with skin. Dairy Whole or 2% milk, cream, and half-and-half. Whole or full-fat cream cheese. Whole-fat or sweetened yogurt. Full-fat cheese. Nondairy creamers. Whipped toppings. Processed cheese and  cheese spreads. Fats and oils Butter. Stick margarine. Lard. Shortening. Ghee. Bacon fat. Tropical oils, such as coconut, palm kernel, or palm oil. Seasonings and condiments Onion salt, garlic salt, seasoned salt, table salt, and sea salt. Worcestershire sauce. Tartar sauce. Barbecue sauce. Teriyaki sauce. Soy sauce, including reduced-sodium. Steak sauce. Canned and packaged gravies. Fish sauce. Oyster sauce. Cocktail sauce. Store-bought horseradish. Ketchup. Mustard. Meat flavorings and tenderizers. Bouillon cubes. Hot sauces. Pre-made or packaged marinades. Pre-made or packaged taco seasonings. Relishes. Regular salad dressings. Other foods Salted popcorn and pretzels. The items listed above may not be a complete list of foods and beverages you should avoid. Contact a dietitian for more information. Where to find more information National Heart, Lung, and Blood Institute: www.nhlbi.nih.gov American Heart Association: www.heart.org Academy of Nutrition and Dietetics: www.eatright.org National Kidney Foundation: www.kidney.org Summary The DASH eating plan is a healthy eating plan that has been shown to reduce high blood pressure (hypertension). It may also reduce your risk for type 2 diabetes, heart disease, and stroke. When on the DASH eating plan, aim to eat more fresh fruits and vegetables, whole grains, lean proteins, low-fat dairy, and heart-healthy fats. With the DASH eating plan, you should limit salt (sodium) intake to 2,300 mg a day. If you have hypertension, you may need to reduce your sodium intake to 1,500 mg a day. Work with your health care provider or dietitian to adjust your eating plan to your individual calorie needs. This information is not intended to replace advice given to you by your health care provider. Make sure you discuss any questions you have with your health care provider. Document Revised: 07/14/2019 Document Reviewed: 07/14/2019 Elsevier Patient Education  2023  Elsevier Inc.  

## 2022-06-12 ENCOUNTER — Other Ambulatory Visit: Payer: Self-pay

## 2022-06-15 ENCOUNTER — Other Ambulatory Visit: Payer: Self-pay

## 2022-06-24 ENCOUNTER — Other Ambulatory Visit: Payer: Self-pay | Admitting: Gerontology

## 2022-06-24 ENCOUNTER — Other Ambulatory Visit: Payer: Self-pay

## 2022-06-24 DIAGNOSIS — E119 Type 2 diabetes mellitus without complications: Secondary | ICD-10-CM

## 2022-06-24 MED ORDER — UNIFINE PENTIPS 31G X 5 MM MISC
1.0000 | Freq: Every day | 2 refills | Status: DC
  Filled 2022-06-24: qty 100, 25d supply, fill #0
  Filled 2022-07-23: qty 100, 25d supply, fill #1

## 2022-06-25 ENCOUNTER — Other Ambulatory Visit: Payer: Self-pay

## 2022-07-14 ENCOUNTER — Other Ambulatory Visit: Payer: Self-pay | Admitting: Pharmacy Technician

## 2022-07-14 ENCOUNTER — Other Ambulatory Visit: Payer: Self-pay

## 2022-07-14 MED FILL — Amlodipine Besylate Tab 5 MG (Base Equivalent): ORAL | 30 days supply | Qty: 30 | Fill #1 | Status: AC

## 2022-07-14 MED FILL — Atorvastatin Calcium Tab 40 MG (Base Equivalent): ORAL | 30 days supply | Qty: 30 | Fill #1 | Status: AC

## 2022-07-14 NOTE — Progress Notes (Signed)
Submitted PAP application for Wixela to [employer].  Will take 4 to 6 weeks to arrive.  Contacted patient and made aware.  Sherilyn Dacosta Patient Advocate Specialist [facility] at [facility]

## 2022-07-15 ENCOUNTER — Other Ambulatory Visit: Payer: Self-pay

## 2022-07-15 MED ORDER — FLUTICASONE-SALMETEROL 100-50 MCG/ACT IN AEPB
INHALATION_SPRAY | Freq: Two times a day (BID) | RESPIRATORY_TRACT | 3 refills | Status: DC
  Filled 2022-07-15 – 2022-08-13 (×2): qty 180, 90d supply, fill #0
  Filled 2022-11-04: qty 180, 90d supply, fill #1
  Filled 2023-06-09: qty 180, 90d supply, fill #2

## 2022-07-23 ENCOUNTER — Other Ambulatory Visit: Payer: Self-pay

## 2022-07-23 ENCOUNTER — Other Ambulatory Visit: Payer: Self-pay | Admitting: Gerontology

## 2022-07-23 DIAGNOSIS — E119 Type 2 diabetes mellitus without complications: Secondary | ICD-10-CM

## 2022-07-23 MED ORDER — RIGHTEST GS550 BLOOD GLUCOSE VI STRP
ORAL_STRIP | 3 refills | Status: AC
  Filled 2022-07-23: qty 100, 25d supply, fill #0
  Filled 2022-08-27: qty 100, 25d supply, fill #1
  Filled 2022-10-01: qty 100, 25d supply, fill #2
  Filled 2022-11-16: qty 100, 25d supply, fill #3

## 2022-07-23 MED ORDER — RIGHTEST GL300 LANCETS MISC
3 refills | Status: DC
  Filled 2022-07-23: qty 100, 25d supply, fill #0

## 2022-08-06 ENCOUNTER — Other Ambulatory Visit: Payer: Self-pay

## 2022-08-06 ENCOUNTER — Ambulatory Visit: Payer: Self-pay | Admitting: Nurse Practitioner

## 2022-08-06 VITALS — BP 140/85 | HR 85 | Temp 97.9°F | Ht 70.0 in | Wt 218.4 lb

## 2022-08-06 DIAGNOSIS — I10 Essential (primary) hypertension: Secondary | ICD-10-CM

## 2022-08-06 DIAGNOSIS — E119 Type 2 diabetes mellitus without complications: Secondary | ICD-10-CM

## 2022-08-06 LAB — POCT GLYCOSYLATED HEMOGLOBIN (HGB A1C): Hemoglobin A1C: 10.4 % — AB (ref 4.0–5.6)

## 2022-08-06 LAB — POCT GLUCOSE (DEVICE FOR HOME USE): POC Glucose: 195 mg/dl — AB (ref 70–99)

## 2022-08-06 MED ORDER — UNIFINE PENTIPS 31G X 5 MM MISC
1.0000 | Freq: Every day | 2 refills | Status: DC
  Filled 2022-08-06 – 2022-08-13 (×2): qty 100, 25d supply, fill #0
  Filled 2022-09-18: qty 100, 25d supply, fill #1
  Filled 2022-11-13 – 2022-11-16 (×2): qty 100, 25d supply, fill #2

## 2022-08-06 MED ORDER — METFORMIN HCL 500 MG PO TABS
1000.0000 mg | ORAL_TABLET | Freq: Two times a day (BID) | ORAL | 1 refills | Status: DC
  Filled 2022-08-06: qty 120, 30d supply, fill #0
  Filled 2023-06-09: qty 120, 30d supply, fill #1

## 2022-08-06 MED ORDER — DAPAGLIFLOZIN PROPANEDIOL 5 MG PO TABS
5.0000 mg | ORAL_TABLET | Freq: Every day | ORAL | 0 refills | Status: DC
  Filled 2022-08-06: qty 30, 30d supply, fill #0

## 2022-08-06 NOTE — Progress Notes (Signed)
Established Patient Office Visit  Subjective   Patient ID: Roy Norton, male    DOB: 06-02-1961  Age: 61 y.o. MRN: [id number]  Chief Complaint  Patient presents with   Follow-up    Roy Norton is a 61 year old male who has history of type 2 diabetes, GERD, hyperlipidemia, hypertension, stroke and presents for follow up diabetes management. His HgbA1c done on 05/14/22 was 11.4 he checks his blood glucose TID. His fasting reading ranges since last visit is between 102 - 423 mg/dl, pre lunch was 80- 398 mg/dl  and pre dinner was 138-393 mg/dl.  His POCT finger stick reading was 195 mg/dl. He denies hypoglycemic symptoms. Patient reports he is overall doing well.      Review of Systems  Constitutional:  Negative for chills, fever, malaise/fatigue and weight loss.  HENT:  Negative for congestion, hearing loss and sore throat.   Eyes:  Negative for blurred vision, double vision and photophobia.  Respiratory:  Negative for shortness of breath.   Cardiovascular:  Negative for chest pain.  Gastrointestinal:  Negative for abdominal pain, blood in stool, constipation, diarrhea, heartburn, nausea and vomiting.  Genitourinary:  Negative for dysuria and frequency.  Musculoskeletal:  Negative for back pain, joint pain and neck pain.  Skin:  Negative for itching and rash.  Neurological:  Negative for dizziness, weakness and headaches.  Endo/Heme/Allergies:  Does not bruise/bleed easily.  Psychiatric/Behavioral:  Negative for depression, substance abuse and suicidal ideas.       Objective:     BP (!) 140/85 (BP Location: Left Arm, Patient Position: Sitting, Cuff Size: Normal)   Pulse 85   Temp 97.9 F (36.6 C) (Oral)   Ht _0  (1.778 m)   Wt 218 lb 6.4 oz (99.1 kg)   SpO2 94%   BMI 31.34 kg/m  BP Readings from Last 3 Encounters:  08/06/22 (!) 140/85  06/11/22 119/82  05/14/22 (!) 137/92   Wt Readings from Last 3 Encounters:  08/06/22 218 lb 6.4 oz (99.1 kg)  06/11/22 211 lb  3.2 oz (95.8 kg)  05/14/22 208 lb 15.9 oz (94.8 kg)      Physical Exam Constitutional:      Appearance: Normal appearance.  HENT:     Head: Normocephalic.     Right Ear: External ear normal.     Left Ear: External ear normal.     Nose: Nose normal.     Mouth/Throat:     Lips: Pink.     Mouth: Mucous membranes are moist.  Cardiovascular:     Rate and Rhythm: Normal rate and regular rhythm.  Pulmonary:     Effort: Pulmonary effort is normal.     Breath sounds: Normal breath sounds.  Abdominal:     General: Bowel sounds are normal.     Palpations: Abdomen is soft.  Musculoskeletal:     Cervical back: Full passive range of motion without pain, normal range of motion and neck supple.  Skin:    General: Skin is warm and dry.  Neurological:     Mental Status: He is alert and oriented to person, place, and time.  Psychiatric:        Attention and Perception: Attention normal.        Mood and Affect: Mood normal.        Speech: Speech normal.        Behavior: Behavior normal. Behavior is cooperative.      No results found for any visits on 08/06/22.  Last  CBC Lab Results  Component Value Date   WBC 7.9 02/26/2022   HGB 16.4 02/26/2022   HCT 48.0 02/26/2022   MCV 92 02/26/2022   MCH 31.4 02/26/2022   RDW 12.6 02/26/2022   PLT 230 47/42/5956   Last metabolic panel Lab Results  Component Value Date   GLUCOSE 288 (H) 02/26/2022   NA 139 02/26/2022   K 4.2 02/26/2022   CL 102 02/26/2022   CO2 21 02/26/2022   BUN 10 02/26/2022   CREATININE 0.80 02/26/2022   EGFR 101 02/26/2022   CALCIUM 9.4 02/26/2022   PROT 7.5 02/26/2022   ALBUMIN 4.6 02/26/2022   LABGLOB 2.9 02/26/2022   AGRATIO 1.6 02/26/2022   BILITOT 0.4 02/26/2022   ALKPHOS 97 02/26/2022   AST 18 02/26/2022   ALT 35 02/26/2022   ANIONGAP 10 04/10/2020   Last lipids Lab Results  Component Value Date   CHOL 158 08/27/2021   HDL 41 08/27/2021   LDLCALC 96 08/27/2021   TRIG 116 08/27/2021   CHOLHDL  3.9 08/27/2021   Last hemoglobin A1c Lab Results  Component Value Date   HGBA1C 11.4 (A) 05/14/2022   Last thyroid functions Lab Results  Component Value Date   TSH 1.770 08/27/2021   T4TOTAL 9.1 03/06/2020   Last vitamin D No results found for: "25OHVITD2", "25OHVITD3", "VD25OH" Last vitamin B12 and Folate No results found for: "VITAMINB12", "FOLATE"    The 10-year ASCVD risk score (Arnett DK, et al., 2019) is: 30.4%    Assessment & Plan:   1. Diabetes mellitus without complication [name]) -61 year old male in clinic today for diabetes follow-up.  His HgbA1c done on 05/14/22 was 11.4 he checks his blood glucose TID.  HgbA1C today is 10.4.  Blood sugar today 195. Patient reports compliance with medication.  Reviewed with patient again foods that are low in sugar and carbs.   -Recommend patient to drink at least 6-8 bottle of water daily and exercise at least three times a week for 30 minutes to one hour.   -Routine blood work along with assessment of kidney function.     Exercise: walks 10 minutes per day, walking the dog  Diet recall :   Breakfast =  frosted cheerios with milk                      Lunch = Bologna sandwich and chips with diet Roy Norton.                         -Reviewed blood sugars logs.  Fasting blood sugars continue to be high.  Prescribed Farxiga 5MG PO daily.  Advised patient to increase water intake for prevention of UTI.  Patient to return to clinic in 4 weeks and if doing well and labs within normal limits, may consider increasing medication to 10 mg.  At next visit check monitor renal function, if eGFR below 45 then stop Farxiga and decrease Metformin.     - POCT Glucose (Device for Home Use); Future - CBC w/Diff; Future - Comp Met (CMET); Future - Urine Microalbumin w/creat. ratio; Future - Urinalysis, Routine w reflex microscopic; Future - CBC w/Diff - Comp Met (CMET) - Urine Microalbumin w/creat. ratio - Urinalysis, Routine w reflex  microscopic - POCT HgB A1C - POCT Glucose (Device for Home Use) - dapagliflozin propanediol (FARXIGA) 5 MG TABS tablet; Take 1 tablet (5 mg total) by mouth daily.  Dispense: 30 tablet; Refill: 0  2. Essential hypertension -Continue with current medication regimen.  BP today 140/85, BP recheck 130/60  Gregary Cromer, FNP

## 2022-08-07 ENCOUNTER — Other Ambulatory Visit: Payer: Self-pay

## 2022-08-07 LAB — CBC WITH DIFFERENTIAL/PLATELET
Basophils Absolute: 0 10*3/uL (ref 0.0–0.2)
Basos: 0 %
EOS (ABSOLUTE): 0.1 10*3/uL (ref 0.0–0.4)
Eos: 1 %
Hematocrit: 44.3 % (ref 37.5–51.0)
Hemoglobin: 15.2 g/dL (ref 13.0–17.7)
Immature Grans (Abs): 0 10*3/uL (ref 0.0–0.1)
Immature Granulocytes: 0 %
Lymphocytes Absolute: 2.8 10*3/uL (ref 0.7–3.1)
Lymphs: 34 %
MCH: 31 pg (ref 26.6–33.0)
MCHC: 34.3 g/dL (ref 31.5–35.7)
MCV: 90 fL (ref 79–97)
Monocytes Absolute: 0.5 10*3/uL (ref 0.1–0.9)
Monocytes: 6 %
Neutrophils Absolute: 5 10*3/uL (ref 1.4–7.0)
Neutrophils: 59 %
Platelets: 224 10*3/uL (ref 150–450)
RBC: 4.9 x10E6/uL (ref 4.14–5.80)
RDW: 12.9 % (ref 11.6–15.4)
WBC: 8.3 10*3/uL (ref 3.4–10.8)

## 2022-08-07 LAB — URINALYSIS, ROUTINE W REFLEX MICROSCOPIC
Bilirubin, UA: NEGATIVE
Ketones, UA: NEGATIVE
Leukocytes,UA: NEGATIVE
Nitrite, UA: NEGATIVE
Protein,UA: NEGATIVE
RBC, UA: NEGATIVE
Specific Gravity, UA: >=1.03 — AB (ref 1.005–1.030)
Urobilinogen, Ur: 1 mg/dL (ref 0.2–1.0)
pH, UA: 6 (ref 5.0–7.5)

## 2022-08-07 LAB — COMPREHENSIVE METABOLIC PANEL
ALT: 30 IU/L (ref 0–44)
AST: 24 IU/L (ref 0–40)
Albumin/Globulin Ratio: 1.5 (ref 1.2–2.2)
Albumin: 4.6 g/dL (ref 3.8–4.9)
Alkaline Phosphatase: 90 IU/L (ref 44–121)
BUN/Creatinine Ratio: 13 (ref 10–24)
BUN: 12 mg/dL (ref 8–27)
Bilirubin Total: 0.4 mg/dL (ref 0.0–1.2)
CO2: 23 mmol/L (ref 20–29)
Calcium: 9.6 mg/dL (ref 8.6–10.2)
Chloride: 102 mmol/L (ref 96–106)
Creatinine, Ser: 0.9 mg/dL (ref 0.76–1.27)
Globulin, Total: 3 g/dL (ref 1.5–4.5)
Glucose: 217 mg/dL — ABNORMAL HIGH (ref 70–99)
Potassium: 4.8 mmol/L (ref 3.5–5.2)
Sodium: 141 mmol/L (ref 134–144)
Total Protein: 7.6 g/dL (ref 6.0–8.5)
eGFR: 98 mL/min/{1.73_m2} (ref >59)

## 2022-08-07 LAB — MICROALBUMIN / CREATININE URINE RATIO
Creatinine, Urine: 131.6 mg/dL
Microalb/Creat Ratio: 6 mg/g creat (ref 0–29)
Microalbumin, Urine: 7.3 ug/mL

## 2022-08-10 ENCOUNTER — Other Ambulatory Visit: Payer: Self-pay

## 2022-08-10 ENCOUNTER — Other Ambulatory Visit: Payer: Self-pay | Admitting: Pharmacy Technician

## 2022-08-10 NOTE — Progress Notes (Signed)
Completed PAP application for [place].  Sent to patient and provider to sign.  Patient will need to provide a Medicaid denial letter to obtain the medication from [employer].  Sherilyn Dacosta Patient Advocate Specialist [facility]

## 2022-08-11 ENCOUNTER — Other Ambulatory Visit: Payer: Self-pay

## 2022-08-11 MED FILL — Atorvastatin Calcium Tab 40 MG (Base Equivalent): ORAL | 30 days supply | Qty: 30 | Fill #2 | Status: AC

## 2022-08-11 MED FILL — Amlodipine Besylate Tab 5 MG (Base Equivalent): ORAL | 30 days supply | Qty: 30 | Fill #2 | Status: AC

## 2022-08-13 ENCOUNTER — Other Ambulatory Visit: Payer: Self-pay

## 2022-08-27 ENCOUNTER — Other Ambulatory Visit: Payer: Self-pay

## 2022-09-03 ENCOUNTER — Ambulatory Visit: Payer: Self-pay | Admitting: Gerontology

## 2022-09-03 ENCOUNTER — Ambulatory Visit: Payer: Self-pay

## 2022-09-03 VITALS — BP 115/75 | HR 83 | Wt 218.0 lb

## 2022-09-03 DIAGNOSIS — E1169 Type 2 diabetes mellitus with other specified complication: Secondary | ICD-10-CM

## 2022-09-03 DIAGNOSIS — I10 Essential (primary) hypertension: Secondary | ICD-10-CM

## 2022-09-03 DIAGNOSIS — E119 Type 2 diabetes mellitus without complications: Secondary | ICD-10-CM

## 2022-09-03 MED ORDER — DAPAGLIFLOZIN PROPANEDIOL 5 MG PO TABS
5.0000 mg | ORAL_TABLET | Freq: Every day | ORAL | 2 refills | Status: DC
  Filled 2022-09-03: qty 30, 30d supply, fill #0

## 2022-09-03 MED ORDER — ATORVASTATIN CALCIUM 40 MG PO TABS
ORAL_TABLET | Freq: Every day | ORAL | 2 refills | Status: DC
  Filled 2022-09-03: qty 30, fill #0
  Filled 2022-09-17: qty 30, 30d supply, fill #0
  Filled 2022-10-21: qty 30, 30d supply, fill #1

## 2022-09-03 MED ORDER — AMLODIPINE BESYLATE 5 MG PO TABS
ORAL_TABLET | Freq: Every day | ORAL | 2 refills | Status: DC
  Filled 2022-09-03: qty 30, fill #0
  Filled 2022-09-17: qty 30, 30d supply, fill #0
  Filled 2022-10-21: qty 30, 30d supply, fill #1

## 2022-09-03 NOTE — Patient Instructions (Signed)
Heart-Healthy Eating Plan Eating a healthy diet is important for the health of your heart. A heart-healthy eating plan includes: Eating less unhealthy fats. Eating more healthy fats. Eating less salt in your food. Salt is also called sodium. Making other changes in your diet. Talk with your doctor or a diet specialist (dietitian) to create an eating plan that is right for you. What is my plan? Your doctor may recommend an eating plan that includes: Total fat: ______% or less of total calories a day. Saturated fat: ______% or less of total calories a day. Cholesterol: less than _________mg a day. Sodium: less than _________mg a day. What are tips for following this plan? Cooking Avoid frying your food. Try to bake, boil, grill, or broil it instead. You can also reduce fat by: Removing the skin from poultry. Removing all visible fats from meats. Steaming vegetables in water or broth. Meal planning  At meals, divide your plate into four equal parts: Fill one-half of your plate with vegetables and green salads. Fill one-fourth of your plate with whole grains. Fill one-fourth of your plate with lean protein foods. Eat 2-4 cups of vegetables per day. One cup of vegetables is: 1 cup (91 g) broccoli or cauliflower florets. 2 medium carrots. 1 large bell pepper. 1 large sweet potato. 1 large tomato. 1 medium white potato. 2 cups (150 g) raw leafy greens. Eat 1-2 cups of fruit per day. One cup of fruit is: 1 small apple 1 large banana 1 cup (237 g) mixed fruit, 1 large orange,  cup (82 g) dried fruit, 1 cup (240 mL) 100% fruit juice. Eat more foods that have soluble fiber. These are apples, broccoli, carrots, beans, peas, and barley. Try to get 20-30 g of fiber per day. Eat 4-5 servings of nuts, legumes, and seeds per week: 1 serving of dried beans or legumes equals  cup (90 g) cooked. 1 serving of nuts is  oz (12 almonds, 24 pistachios, or 7 walnut halves). 1 serving of seeds  equals  oz (8 g). General information Eat more home-cooked food. Eat less restaurant, buffet, and fast food. Limit or avoid alcohol. Limit foods that are high in starch and sugar. Avoid fried foods. Lose weight if you are overweight. Keep track of how much salt (sodium) you eat. This is important if you have high blood pressure. Ask your doctor to tell you more about this. Try to add vegetarian meals each week. Fats Choose healthy fats. These include olive oil and canola oil, flaxseeds, walnuts, almonds, and seeds. Eat more omega-3 fats. These include salmon, mackerel, sardines, tuna, flaxseed oil, and ground flaxseeds. Try to eat fish at least 2 times each week. Check food labels. Avoid foods with trans fats or high amounts of saturated fat. Limit saturated fats. These are often found in animal products, such as meats, butter, and cream. These are also found in plant foods, such as palm oil, palm kernel oil, and coconut oil. Avoid foods with partially hydrogenated oils in them. These have trans fats. Examples are stick margarine, some tub margarines, cookies, crackers, and other baked goods. What foods should I eat? Fruits All fresh, canned (in natural juice), or frozen fruits. Vegetables Fresh or frozen vegetables (raw, steamed, roasted, or grilled). Green salads. Grains Most grains. Choose whole wheat and whole grains most of the time. Rice and pasta, including brown rice and pastas made with whole wheat. Meats and other proteins Lean, well-trimmed beef, veal, pork, and lamb. Chicken and turkey without skin.   All fish and shellfish. Wild duck, rabbit, pheasant, and venison. Egg whites or low-cholesterol egg substitutes. Dried beans, peas, lentils, and tofu. Seeds and most nuts. Dairy Low-fat or nonfat cheeses, including ricotta and mozzarella. Skim or 1% milk that is liquid, powdered, or evaporated. Buttermilk that is made with low-fat milk. Nonfat or low-fat yogurt. Fats and  oils Non-hydrogenated (trans-free) margarines. Vegetable oils, including soybean, sesame, sunflower, olive, peanut, safflower, corn, canola, and cottonseed. Salad dressings or mayonnaise made with a vegetable oil. Beverages Mineral water. Coffee and tea. Diet carbonated beverages. Sweets and desserts Sherbet, gelatin, and fruit ice. Small amounts of dark chocolate. Limit all sweets and desserts. Seasonings and condiments All seasonings and condiments. The items listed above may not be a complete list of foods and drinks you can eat. Contact a dietitian for more options. What foods should I avoid? Fruits Canned fruit in heavy syrup. Fruit in cream or butter sauce. Fried fruit. Limit coconut. Vegetables Vegetables cooked in cheese, cream, or butter sauce. Fried vegetables. Grains Breads that are made with saturated or trans fats, oils, or whole milk. Croissants. Sweet rolls. Donuts. High-fat crackers, such as cheese crackers. Meats and other proteins Fatty meats, such as hot dogs, ribs, sausage, bacon, rib-eye roast or steak. High-fat deli meats, such as salami and bologna. Caviar. Domestic duck and goose. Organ meats, such as liver. Dairy Cream, sour cream, cream cheese, and creamed cottage cheese. Whole-milk cheeses. Whole or 2% milk that is liquid, evaporated, or condensed. Whole buttermilk. Cream sauce or high-fat cheese sauce. Yogurt that is made from whole milk. Fats and oils Meat fat, or shortening. Cocoa butter, hydrogenated oils, palm oil, coconut oil, palm kernel oil. Solid fats and shortenings, including bacon fat, salt pork, lard, and butter. Nondairy cream substitutes. Salad dressings with cheese or sour cream. Beverages Regular sodas and juice drinks with added sugar. Sweets and desserts Frosting. Pudding. Cookies. Cakes. Pies. Milk chocolate or white chocolate. Buttered syrups. Full-fat ice cream or ice cream drinks. The items listed above may not be a complete list of foods  and drinks to avoid. Contact a dietitian for more information. Summary Heart-healthy meal planning includes eating less unhealthy fats, eating more healthy fats, and making other changes in your diet. Eat a balanced diet. This includes fruits and vegetables, low-fat or nonfat dairy, lean protein, nuts and legumes, whole grains, and heart-healthy oils and fats. This information is not intended to replace advice given to you by your health care provider. Make sure you discuss any questions you have with your health care provider. Document Revised: 09/15/2021 Document Reviewed: 09/15/2021 Elsevier Patient Education  2023 [employer] Carbohydrate Counting for Diabetes Mellitus, Adult Carbohydrate counting is a method of keeping track of how many carbohydrates you eat. Eating carbohydrates increases the amount of sugar (glucose) in the blood. Counting how many carbohydrates you eat improves how well you manage your blood glucose. This, in turn, helps you manage your diabetes. Carbohydrates are measured in grams (g) per serving. It is important to know how many carbohydrates (in grams or by serving size) you can have in each meal. This is different for every person. A dietitian can help you make a meal plan and calculate how many carbohydrates you should have at each meal and snack. What foods contain carbohydrates? Carbohydrates are found in the following foods: Grains, such as breads and cereals. Dried beans and soy products. Starchy vegetables, such as potatoes, peas, and corn. Fruit and fruit juices. Milk and yogurt. Sweets and snack   foods, such as cake, cookies, candy, chips, and soft drinks. How do I count carbohydrates in foods? There are two ways to count carbohydrates in food. You can read food labels or learn standard serving sizes of foods. You can use either of these methods or a combination of both. Using the Nutrition Facts label The Nutrition Facts list is included on the labels of  almost all packaged foods and beverages in the [place]. It includes: The serving size. Information about nutrients in each serving, including the grams of carbohydrate per serving. To use the Nutrition Facts, decide how many servings you will have. Then, multiply the number of servings by the number of carbohydrates per serving. The resulting number is the total grams of carbohydrates that you will be having. Learning the standard serving sizes of foods When you eat carbohydrate foods that are not packaged or do not include Nutrition Facts on the label, you need to measure the servings in order to count the grams of carbohydrates. Measure the foods that you will eat with a food scale or measuring cup, if needed. Decide how many standard-size servings you will eat. Multiply the number of servings by 15. For foods that contain carbohydrates, one serving equals 15 g of carbohydrates. For example, if you eat 2 cups or 10 oz (300 g) of strawberries, you will have eaten 2 servings and 30 g of carbohydrates (2 servings x 15 g = 30 g). For foods that have more than one food mixed, such as soups and casseroles, you must count the carbohydrates in each food that is included. The following list contains standard serving sizes of common carbohydrate-rich foods. Each of these servings has about 15 g of carbohydrates: 1 slice of bread. 1 six-inch (15 cm) tortilla. ? cup or 2 oz (53 g) cooked rice or pasta.  cup or 3 oz (85 g) cooked or canned, drained and rinsed beans or lentils.  cup or 3 oz (85 g) starchy vegetable, such as peas, corn, or squash.  cup or 4 oz (120 g) hot cereal.  cup or 3 oz (85 g) boiled or mashed potatoes, or  or 3 oz (85 g) of a large baked potato.  cup or 4 fl oz (118 mL) fruit juice. 1 cup or 8 fl oz (237 mL) milk. 1 small or 4 oz (106 g) apple.  or 2 oz (63 g) of a medium banana. 1 cup or 5 oz (150 g) strawberries. 3 cups or 1 oz (28.3 g) popped popcorn. What is an  example of carbohydrate counting? To calculate the grams of carbohydrates in this sample meal, follow the steps shown below. Sample meal 3 oz (85 g) chicken breast. ? cup or 4 oz (106 g) brown rice.  cup or 3 oz (85 g) corn. 1 cup or 8 fl oz (237 mL) milk. 1 cup or 5 oz (150 g) strawberries with sugar-free whipped topping. Carbohydrate calculation Identify the foods that contain carbohydrates: Rice. Corn. Milk. Strawberries. Calculate how many servings you have of each food: 2 servings rice. 1 serving corn. 1 serving milk. 1 serving strawberries. Multiply each number of servings by 15 g: 2 servings rice x 15 g = 30 g. 1 serving corn x 15 g = 15 g. 1 serving milk x 15 g = 15 g. 1 serving strawberries x 15 g = 15 g. Add together all of the amounts to find the total grams of carbohydrates eaten: 30 g + 15 g + 15 g +  15 g = 75 g of carbohydrates total. What are tips for following this plan? Shopping Develop a meal plan and then make a shopping list. Buy fresh and frozen vegetables, fresh and frozen fruit, dairy, eggs, beans, lentils, and whole grains. Look at food labels. Choose foods that have more fiber and less sugar. Avoid processed foods and foods with added sugars. Meal planning Aim to have the same number of grams of carbohydrates at each meal and for each snack time. Plan to have regular, balanced meals and snacks. Where to find more information American Diabetes Association: diabetes.org Centers for Disease Control and Prevention: StoreMirror.com.[place] Academy of Nutrition and Dietetics: eatright.org Association of Diabetes Care & Education Specialists: diabeteseducator.org Summary Carbohydrate counting is a method of keeping track of how many carbohydrates you eat. Eating carbohydrates increases the amount of sugar (glucose) in your blood. Counting how many carbohydrates you eat improves how well you manage your blood glucose. This helps you manage your diabetes. A dietitian can  help you make a meal plan and calculate how many carbohydrates you should have at each meal and snack. This information is not intended to replace advice given to you by your health care provider. Make sure you discuss any questions you have with your health care provider. Document Revised: 03/13/2020 Document Reviewed: 03/13/2020 Elsevier Patient Education  [place] Eating Plan DASH stands for Dietary Approaches to Stop Hypertension. The DASH eating plan is a healthy eating plan that has been shown to: Reduce high blood pressure (hypertension). Reduce your risk for type 2 diabetes, heart disease, and stroke. Help with weight loss. What are tips for following this plan? Reading food labels Check food labels for the amount of salt (sodium) per serving. Choose foods with less than 5 percent of the Daily Value of sodium. Generally, foods with less than 300 milligrams (mg) of sodium per serving fit into this eating plan. To find whole grains, look for the word "whole" as the first word in the ingredient list. Shopping Buy products labeled as "low-sodium" or "no salt added." Buy fresh foods. Avoid canned foods and pre-made or frozen meals. Cooking Avoid adding salt when cooking. Use salt-free seasonings or herbs instead of table salt or sea salt. Check with your health care provider or pharmacist before using salt substitutes. Do not fry foods. Cook foods using healthy methods such as baking, boiling, grilling, roasting, and broiling instead. Cook with heart-healthy oils, such as olive, canola, avocado, soybean, or sunflower oil. Meal planning  Eat a balanced diet that includes: 4 or more servings of fruits and 4 or more servings of vegetables each day. Try to fill one-half of your plate with fruits and vegetables. 6-8 servings of whole grains each day. Less than 6 oz (170 g) of lean meat, poultry, or fish each day. A 3-oz (85-g) serving of meat is about the same size as a deck of  cards. One egg equals 1 oz (28 g). 2-3 servings of low-fat dairy each day. One serving is 1 cup (237 mL). 1 serving of nuts, seeds, or beans 5 times each week. 2-3 servings of heart-healthy fats. Healthy fats called omega-3 fatty acids are found in foods such as walnuts, flaxseeds, fortified milks, and eggs. These fats are also found in cold-water fish, such as sardines, salmon, and mackerel. Limit how much you eat of: Canned or prepackaged foods. Food that is high in trans fat, such as some fried foods. Food that is high in saturated fat, such as  fatty meat. Desserts and other sweets, sugary drinks, and other foods with added sugar. Full-fat dairy products. Do not salt foods before eating. Do not eat more than 4 egg yolks a week. Try to eat at least 2 vegetarian meals a week. Eat more home-cooked food and less restaurant, buffet, and fast food. Lifestyle When eating at a restaurant, ask that your food be prepared with less salt or no salt, if possible. If you drink alcohol: Limit how much you use to: 0-1 drink a day for women who are not pregnant. 0-2 drinks a day for men. Be aware of how much alcohol is in your drink. In the [place] one drink equals one 12 oz bottle of beer (355 mL), one 5 oz glass of wine (148 mL), or one 1 oz glass of hard liquor (44 mL). General information Avoid eating more than 2,300 mg of salt a day. If you have hypertension, you may need to reduce your sodium intake to 1,500 mg a day. Work with your health care provider to maintain a healthy body weight or to lose weight. Ask what an ideal weight is for you. Get at least 30 minutes of exercise that causes your heart to beat faster (aerobic exercise) most days of the week. Activities may include walking, swimming, or biking. Work with your health care provider or dietitian to adjust your eating plan to your individual calorie needs. What foods should I eat? Fruits All fresh, dried, or frozen fruit. Canned fruit in  natural juice (without added sugar). Vegetables Fresh or frozen vegetables (raw, steamed, roasted, or grilled). Low-sodium or reduced-sodium tomato and vegetable juice. Low-sodium or reduced-sodium tomato sauce and tomato paste. Low-sodium or reduced-sodium canned vegetables. Grains Whole-grain or whole-wheat bread. Whole-grain or whole-wheat pasta. Brown rice. [place]. Bulgur. Whole-grain and low-sodium cereals. Pita bread. Low-fat, low-sodium crackers. Whole-wheat flour tortillas. Meats and other proteins Skinless chicken or [place]. Ground chicken or [place]. Pork with fat trimmed off. Fish and seafood. Egg whites. Dried beans, peas, or lentils. Unsalted nuts, nut butters, and seeds. Unsalted canned beans. Lean cuts of beef with fat trimmed off. Low-sodium, lean precooked or cured meat, such as sausages or meat loaves. Dairy Low-fat (1%) or fat-free (skim) milk. Reduced-fat, low-fat, or fat-free cheeses. Nonfat, low-sodium ricotta or cottage cheese. Low-fat or nonfat yogurt. Low-fat, low-sodium cheese. Fats and oils Soft margarine without trans fats. Vegetable oil. Reduced-fat, low-fat, or light mayonnaise and salad dressings (reduced-sodium). Canola, safflower, olive, avocado, soybean, and sunflower oils. Avocado. Seasonings and condiments Herbs. Spices. Seasoning mixes without salt. Other foods Unsalted popcorn and pretzels. Fat-free sweets. The items listed above may not be a complete list of foods and beverages you can eat. Contact a dietitian for more information. What foods should I avoid? Fruits Canned fruit in a light or heavy syrup. Fried fruit. Fruit in cream or butter sauce. Vegetables Creamed or fried vegetables. Vegetables in a cheese sauce. Regular canned vegetables (not low-sodium or reduced-sodium). Regular canned tomato sauce and paste (not low-sodium or reduced-sodium). Regular tomato and vegetable juice (not low-sodium or reduced-sodium). Angie Fava.  Olives. Grains Baked goods made with fat, such as croissants, muffins, or some breads. Dry pasta or rice meal packs. Meats and other proteins Fatty cuts of meat. Ribs. Fried meat. Berniece Salines. Bologna, salami, and other precooked or cured meats, such as sausages or meat loaves. Fat from the back of a pig (fatback). Bratwurst. Salted nuts and seeds. Canned beans with added salt. Canned or smoked fish. Whole eggs or egg yolks.  Chicken or [place] with skin. Dairy Whole or 2% milk, cream, and half-and-half. Whole or full-fat cream cheese. Whole-fat or sweetened yogurt. Full-fat cheese. Nondairy creamers. Whipped toppings. Processed cheese and cheese spreads. Fats and oils Butter. Stick margarine. Lard. Shortening. Ghee. Bacon fat. Tropical oils, such as coconut, palm kernel, or palm oil. Seasonings and condiments Onion salt, garlic salt, seasoned salt, table salt, and sea salt. Worcestershire sauce. Tartar sauce. Barbecue sauce. Teriyaki sauce. Soy sauce, including reduced-sodium. Steak sauce. Canned and packaged gravies. Fish sauce. Oyster sauce. Cocktail sauce. Store-bought horseradish. Ketchup. Mustard. Meat flavorings and tenderizers. Bouillon cubes. Hot sauces. Pre-made or packaged marinades. Pre-made or packaged taco seasonings. Relishes. Regular salad dressings. Other foods Salted popcorn and pretzels. The items listed above may not be a complete list of foods and beverages you should avoid. Contact a dietitian for more information. Where to find more information National Heart, Lung, and Blood Institute: PopSteam.is American Heart Association: www.heart.org Academy of Nutrition and Dietetics: www.eatright.org National Kidney Foundation: www.kidney.org Summary The DASH eating plan is a healthy eating plan that has been shown to reduce high blood pressure (hypertension). It may also reduce your risk for type 2 diabetes, heart disease, and stroke. When on the DASH eating plan, aim to eat more  fresh fruits and vegetables, whole grains, lean proteins, low-fat dairy, and heart-healthy fats. With the DASH eating plan, you should limit salt (sodium) intake to 2,300 mg a day. If you have hypertension, you may need to reduce your sodium intake to 1,500 mg a day. Work with your health care provider or dietitian to adjust your eating plan to your individual calorie needs. This information is not intended to replace advice given to you by your health care provider. Make sure you discuss any questions you have with your health care provider. Document Revised: 07/14/2019 Document Reviewed: 07/14/2019 Elsevier Patient Education  2023 [employer].

## 2022-09-03 NOTE — Progress Notes (Signed)
Established Patient Office Visit  Subjective   Patient ID: Roy Norton, male    DOB: 1961-01-29  Age: 62 y.o. MRN: [id number]  No chief complaint on file.   HPI  Roy Norton is a 62 year old male who has history of type 2 diabetes, GERD, hyperlipidemia, hypertension, stroke and presents for follow up visit and medication refill. His HgbA1c done on 08/06/22 decreased from 11.4% to 10.4%. He states that he's compliant with his medications, denies side effects and continues to make healthy lifestyle changes. He states that he checks his blood glucose tid,his fasting blood glucose ranges between 116-157 mg/dl, lunch time between 123- 257 mg/dl and dinner between 117- 233 mg/dl.  He denies hypo/hyperglycemic symptoms, denies periperal neuropathy and performs daily foot checks. Overall, he states that he's doing well and offers no further complaint.    Review of Systems  Constitutional: Negative.   Eyes: Negative.   Cardiovascular: Negative.   Skin: Negative.   Neurological: Negative.   Endo/Heme/Allergies: Negative.   Psychiatric/Behavioral: Negative.        Objective:     BP 115/75 (BP Location: Left Arm, Patient Position: Sitting, Cuff Size: Large)   Pulse 83   Wt 218 lb (98.9 kg)   BMI 31.28 kg/m  BP Readings from Last 3 Encounters:  09/03/22 115/75  08/06/22 (!) 140/85  06/11/22 119/82   Wt Readings from Last 3 Encounters:  09/03/22 218 lb (98.9 kg)  08/06/22 218 lb 6.4 oz (99.1 kg)  06/11/22 211 lb 3.2 oz (95.8 kg)      Physical Exam HENT:     Head: Normocephalic and atraumatic.     Mouth/Throat:     Mouth: Mucous membranes are moist.  Eyes:     Pupils: Pupils are equal, round, and reactive to light.  Cardiovascular:     Rate and Rhythm: Normal rate and regular rhythm.     Pulses: Normal pulses.     Heart sounds: Normal heart sounds.  Pulmonary:     Effort: Pulmonary effort is normal.     Breath sounds: Normal breath sounds.  Skin:    General:  Skin is warm.  Neurological:     General: No focal deficit present.     Mental Status: He is alert and oriented to person, place, and time. Mental status is at baseline.  Psychiatric:        Mood and Affect: Mood normal.        Behavior: Behavior normal.        Thought Content: Thought content normal.        Judgment: Judgment normal.      No results found for any visits on 09/03/22.  Last CBC Lab Results  Component Value Date   WBC 8.3 08/06/2022   HGB 15.2 08/06/2022   HCT 44.3 08/06/2022   MCV 90 08/06/2022   MCH 31.0 08/06/2022   RDW 12.9 08/06/2022   PLT 224 30/86/5784   Last metabolic panel Lab Results  Component Value Date   GLUCOSE 217 (H) 08/06/2022   NA 141 08/06/2022   K 4.8 08/06/2022   CL 102 08/06/2022   CO2 23 08/06/2022   BUN 12 08/06/2022   CREATININE 0.90 08/06/2022   EGFR 98 08/06/2022   CALCIUM 9.6 08/06/2022   PROT 7.6 08/06/2022   ALBUMIN 4.6 08/06/2022   LABGLOB 3.0 08/06/2022   AGRATIO 1.5 08/06/2022   BILITOT 0.4 08/06/2022   ALKPHOS 90 08/06/2022   AST 24 08/06/2022   ALT 30  08/06/2022   ANIONGAP 10 04/10/2020   Last lipids Lab Results  Component Value Date   CHOL 158 08/27/2021   HDL 41 08/27/2021   LDLCALC 96 08/27/2021   TRIG 116 08/27/2021   CHOLHDL 3.9 08/27/2021   Last hemoglobin A1c Lab Results  Component Value Date   HGBA1C 10.4 (A) 08/06/2022   Last thyroid functions Lab Results  Component Value Date   TSH 1.770 08/27/2021   T4TOTAL 9.1 03/06/2020      The 10-year ASCVD risk score (Arnett DK, et al., 2019) is: 22.5%    Assessment & Plan:    1. Essential hypertension - His blood pressure is under control, he will continue current medication. -Low salt DASH diet -Take medications regularly on time -Exercise regularly as tolerated -Check blood pressure at least once a week at home or a nearby pharmacy and record -Goal is less than 140/90 and normal blood pressure is 120/80 - amLODipine (NORVASC) 5 MG  tablet; TAKE ONE TABLET BY MOUTH ONCE EVERY DAY.  Dispense: 30 tablet; Refill: 2  2. Hyperlipidemia associated with type 2 diabetes mellitus ([place]) - He will continue current medication and will recheck Lipid panel. -Low fat Diet, like low fat dairy products eg skimmed milk -Avoid any fried food -Regular exercise/walk -Goal for Total Cholesterol is less than 200 -Goal for bad cholesterol LDL is less than 70 -Goal for Good cholesterol HDL is more than 45 -Goal for Triglyceride is less than 150 - Lipid panel; Future - atorvastatin (LIPITOR) 40 MG tablet; TAKE ONE TABLET BY MOUTH ONCE EVERY DAY.  Dispense: 30 tablet; Refill: 2  3. Diabetes mellitus without complication ([place]) - His diabetes is improving, his HgbA1c was 10.4%,his goal should be less than 7%. -Use Diabetic diet as advised  -Check blood sugar 2-3 times a day, once before breakfast and others 2 hours after lunch or dinner -Write down the numbers against date in a log -Bring log to clinic every visit -Take medications regularly as advised -Regular exercise - dapagliflozin propanediol (FARXIGA) 5 MG TABS tablet; Take 1 tablet (5 mg total) by mouth daily.  Dispense: 30 tablet; Refill: 2   Return in about 9 weeks (around 11/05/2022), or if symptoms worsen or fail to improve.    Mirza Kidney Jerold Coombe, NP

## 2022-09-04 ENCOUNTER — Other Ambulatory Visit: Payer: Self-pay | Admitting: Pharmacy Technician

## 2022-09-04 ENCOUNTER — Other Ambulatory Visit: Payer: Self-pay

## 2022-09-04 LAB — LIPID PANEL
Chol/HDL Ratio: 4 ratio (ref 0.0–5.0)
Cholesterol, Total: 148 mg/dL (ref 100–199)
HDL: 37 mg/dL — ABNORMAL LOW (ref >39)
LDL Chol Calc (NIH): 76 mg/dL (ref 0–99)
Triglycerides: 208 mg/dL — ABNORMAL HIGH (ref 0–149)
VLDL Cholesterol Cal: 35 mg/dL (ref 5–40)

## 2022-09-04 NOTE — Progress Notes (Signed)
Attempted to contact patient regarding [employer] paperwork.  Unable to reach or leave message.  Received a message stating patient has not set-up mailbox.  Unable to order [place] without patient's proof of income and Medicaid denial letter.  Jacquelynn Cree Patient Advocate Specialist [place] at [facility]

## 2022-09-08 ENCOUNTER — Other Ambulatory Visit: Payer: Self-pay

## 2022-09-09 ENCOUNTER — Other Ambulatory Visit: Payer: Self-pay

## 2022-09-09 MED ORDER — DAPAGLIFLOZIN PROPANEDIOL 5 MG PO TABS
5.0000 mg | ORAL_TABLET | Freq: Every day | ORAL | 3 refills | Status: DC
  Filled 2022-09-17: qty 30, 30d supply, fill #0
  Filled 2022-12-24: qty 30, 30d supply, fill #1
  Filled 2023-01-20: qty 90, 90d supply, fill #2
  Filled 2023-04-20: qty 90, 90d supply, fill #3

## 2022-09-17 ENCOUNTER — Other Ambulatory Visit: Payer: Self-pay | Admitting: Gerontology

## 2022-09-17 ENCOUNTER — Other Ambulatory Visit: Payer: Self-pay

## 2022-09-17 DIAGNOSIS — E119 Type 2 diabetes mellitus without complications: Secondary | ICD-10-CM

## 2022-09-17 MED ORDER — EMPAGLIFLOZIN 10 MG PO TABS
10.0000 mg | ORAL_TABLET | Freq: Every day | ORAL | 0 refills | Status: DC
  Filled 2022-09-17: qty 30, 30d supply, fill #0

## 2022-09-18 ENCOUNTER — Other Ambulatory Visit: Payer: Self-pay

## 2022-09-23 ENCOUNTER — Other Ambulatory Visit: Payer: Self-pay

## 2022-09-28 ENCOUNTER — Other Ambulatory Visit: Payer: Self-pay

## 2022-10-01 ENCOUNTER — Other Ambulatory Visit: Payer: Self-pay

## 2022-10-05 ENCOUNTER — Other Ambulatory Visit: Payer: Self-pay

## 2022-10-14 ENCOUNTER — Other Ambulatory Visit: Payer: Self-pay

## 2022-10-21 ENCOUNTER — Other Ambulatory Visit: Payer: Self-pay

## 2022-10-30 ENCOUNTER — Other Ambulatory Visit: Payer: Self-pay

## 2022-11-04 ENCOUNTER — Other Ambulatory Visit: Payer: Self-pay

## 2022-11-05 ENCOUNTER — Other Ambulatory Visit: Payer: Self-pay

## 2022-11-05 ENCOUNTER — Ambulatory Visit: Payer: Self-pay | Admitting: Gerontology

## 2022-11-05 ENCOUNTER — Encounter: Payer: Self-pay | Admitting: Gerontology

## 2022-11-05 VITALS — BP 125/86 | HR 80 | Ht 70.0 in | Wt 217.6 lb

## 2022-11-05 DIAGNOSIS — M25561 Pain in right knee: Secondary | ICD-10-CM | POA: Insufficient documentation

## 2022-11-05 DIAGNOSIS — E119 Type 2 diabetes mellitus without complications: Secondary | ICD-10-CM

## 2022-11-05 DIAGNOSIS — G8929 Other chronic pain: Secondary | ICD-10-CM

## 2022-11-05 DIAGNOSIS — I10 Essential (primary) hypertension: Secondary | ICD-10-CM

## 2022-11-05 DIAGNOSIS — E782 Mixed hyperlipidemia: Secondary | ICD-10-CM

## 2022-11-05 DIAGNOSIS — E1169 Type 2 diabetes mellitus with other specified complication: Secondary | ICD-10-CM

## 2022-11-05 LAB — POCT GLYCOSYLATED HEMOGLOBIN (HGB A1C): Hemoglobin A1C: 8.3 % — AB (ref 4.0–5.6)

## 2022-11-05 MED ORDER — AMLODIPINE BESYLATE 5 MG PO TABS
5.0000 mg | ORAL_TABLET | Freq: Every day | ORAL | 2 refills | Status: DC
  Filled 2022-11-05: qty 30, fill #0
  Filled 2022-11-13: qty 30, 30d supply, fill #0
  Filled 2022-12-24: qty 30, 30d supply, fill #1
  Filled 2023-01-20: qty 30, 30d supply, fill #2

## 2022-11-05 MED ORDER — ATORVASTATIN CALCIUM 40 MG PO TABS
40.0000 mg | ORAL_TABLET | Freq: Every day | ORAL | 2 refills | Status: DC
  Filled 2022-11-05: qty 30, fill #0
  Filled 2022-11-13: qty 30, 30d supply, fill #0
  Filled 2022-12-24: qty 30, 30d supply, fill #1
  Filled 2023-01-20: qty 30, 30d supply, fill #2

## 2022-11-05 NOTE — Progress Notes (Signed)
Established Patient Office Visit  Subjective   Patient ID: Roy Norton, male    DOB: [birth year]  Age: 62 y.o. MRN: [id number]  Chief Complaint  Patient presents with   Follow-up    HPI Roy Norton is a 62 year old male who has history of type 2 diabetes, GERD, hyperlipidemia, hypertension, stroke and presents for follow up visit and medication refill.   His HgbA1c today is 8.3%, decreased from 10.4% . He states that he has refocused on lifestyle modifications in addition to strict adherence with his medications and denies side effects. Today he brings his blood glucose log where he records fasting BG levels TID. His morning fasting blood glucose ranges between 116-157 mg/dl with only 5 readings around 200 mg/dL; lunch time between 123- 257 mg/dl with 2 readings around 300 mg/dL, and dinner between 117- 249 mg/dl with only 3 readings around 300 mg/dL. He has only 3 total readings between 94-99 mg/dL. He eats cheerios or eggs for breakfast, drinks black coffee and water. He has a variety of protein and vegetables for lunch and dinner. He denies hypo/hyperglycemic symptoms, denies periperal neuropathy, and performs daily foot checks. He walks 1-2 miles several days a week. He lives with and helps his mother take care of his stepfather, who is currently suffering from increasing dementia. He states he is "pretty well supported" at home.   Today he reports occasional right knee pain due to increased physical activity. It does not bother him at the visit today, 0/10. He does not take any medication to make it feel better, stating that rest and pillows to prop it up at night make it feel better. He states he developed right knee pain 10 years ago at work due to walking on concrete surfaces all day. He states he has had 2 injections "a few years ago", then was told would not be effective any more and needed surgery to "clean out" his knee. He had surgery scheduled but had to cancel due to his step  father's declining health.  Overall, he states that he's doing well and offers no further complaint.    Needs eye exam, colonoscopy,   Past Medical History:  Diagnosis Date   Diabetes mellitus without complication ([place])    Edentulous    GERD (gastroesophageal reflux disease)    Hyperlipidemia    Hypertension    Stroke ([place])    No deficits   Past Surgical History:  Procedure Laterality Date   CARDIAC CATHETERIZATION  2013   Family History  Problem Relation Age of Onset   Hypertension Mother    Hypertension Father    Diabetes Father    No Known Allergies    Review of Systems  Constitutional: Negative.   HENT: Negative.    Eyes:  Negative for blurred vision, double vision and pain.  Respiratory:  Negative for cough, shortness of breath and wheezing.   Cardiovascular:  Negative for chest pain, palpitations, orthopnea, claudication and leg swelling.  Gastrointestinal:  Negative for blood in stool, diarrhea, heartburn and nausea.  Genitourinary:  Negative for dysuria, frequency and urgency.  Musculoskeletal:  Positive for joint pain (right knee). Negative for falls.  Skin: Negative.   Neurological:  Negative for dizziness, tingling, sensory change and headaches.  Psychiatric/Behavioral:  Negative for depression, memory loss, substance abuse and suicidal ideas.       Objective:     BP 125/86   Pulse 80   Ht '5\' 10"'$  (1.778 m)   Wt 217 lb 9.6  oz (98.7 kg)   SpO2 95%   BMI 31.22 kg/m  BP Readings from Last 3 Encounters:  11/05/22 125/86  09/03/22 115/75  08/06/22 (!) 140/85   Wt Readings from Last 3 Encounters:  11/05/22 217 lb 9.6 oz (98.7 kg)  09/03/22 218 lb (98.9 kg)  08/06/22 218 lb 6.4 oz (99.1 kg)      Physical Exam Vitals and nursing note reviewed.  Constitutional:      General: He is not in acute distress.    Appearance: Normal appearance.  HENT:     Head: Normocephalic.     Mouth/Throat:     Mouth: Mucous membranes are moist.  Eyes:      Conjunctiva/sclera: Conjunctivae normal.  Cardiovascular:     Rate and Rhythm: Normal rate and regular rhythm.     Pulses: Normal pulses.     Heart sounds: Normal heart sounds. No murmur heard.    No friction rub. No gallop.  Pulmonary:     Effort: Pulmonary effort is normal.     Breath sounds: No wheezing, rhonchi or rales.  Abdominal:     General: Bowel sounds are normal.     Palpations: Abdomen is soft.  Musculoskeletal:        General: No swelling, tenderness, deformity or signs of injury.     Cervical back: Normal range of motion.  Skin:    General: Skin is warm and dry.     Capillary Refill: Capillary refill takes less than 2 seconds.     Coloration: Skin is not jaundiced or pale.     Findings: No bruising.  Neurological:     General: No focal deficit present.     Mental Status: He is alert and oriented to person, place, and time.     Motor: No weakness.     Coordination: Coordination normal.     Gait: Gait normal.  Psychiatric:        Mood and Affect: Mood normal.        Behavior: Behavior normal.        Thought Content: Thought content normal.      Results for orders placed or performed in visit on 11/05/22  POCT HgB A1C  Result Value Ref Range   Hemoglobin A1C 8.3 (A) 4.0 - 5.6 %   HbA1c POC (<> result, manual entry)     HbA1c, POC (prediabetic range)     HbA1c, POC (controlled diabetic range)      Last CBC Lab Results  Component Value Date   WBC 8.3 08/06/2022   HGB 15.2 08/06/2022   HCT 44.3 08/06/2022   MCV 90 08/06/2022   MCH 31.0 08/06/2022   RDW 12.9 08/06/2022   PLT 224 [id number]   Last metabolic panel Lab Results  Component Value Date   GLUCOSE 217 (H) 08/06/2022   NA 141 08/06/2022   K 4.8 08/06/2022   CL 102 08/06/2022   CO2 23 08/06/2022   BUN 12 08/06/2022   CREATININE 0.90 08/06/2022   EGFR 98 08/06/2022   CALCIUM 9.6 08/06/2022   PROT 7.6 08/06/2022   ALBUMIN 4.6 08/06/2022   LABGLOB 3.0 08/06/2022   AGRATIO 1.5 08/06/2022    BILITOT 0.4 08/06/2022   ALKPHOS 90 08/06/2022   AST 24 08/06/2022   ALT 30 08/06/2022   ANIONGAP 10 04/10/2020   Last lipids Lab Results  Component Value Date   CHOL 148 09/03/2022   HDL 37 (L) 09/03/2022   LDLCALC 76 09/03/2022   TRIG 208 (H)  09/03/2022   CHOLHDL 4.0 09/03/2022   Last hemoglobin A1c Lab Results  Component Value Date   HGBA1C 8.3 (A) 11/05/2022   Last thyroid functions Lab Results  Component Value Date   TSH 1.770 08/27/2021   T4TOTAL 9.1 03/06/2020      The ASCVD Risk score (Arnett DK, et al., 2019) failed to calculate for the following reasons:   The patient has a prior MI or stroke diagnosis    Assessment & Plan:  1. Diabetes mellitus without complication ([place]) - His diabetes is improving at HgbA1c 8.3% and his goal should be less than 7%. He has much better glucose control. Advised continued lifestyle modifications and medication adherence. - POCT HgB A1C; Future - POCT Glucose (CBG); Future - POCT HgB A1C  2. Mixed hyperlipidemia - encouraged continued lifestyle modifications and complete tobacco cessation. Will check lipids in 3 months at follow up.  3. Essential hypertension -  His blood pressure is under controled, encouraged continued lifestyle modifications of diet and exercise plus complete tobacco cessation. Medication refilled today, asked him to bring all medications to follow up appointment in 3 months. - amLODipine (NORVASC) 5 MG tablet; Take 1 tablet (5 mg total) by mouth daily.  Dispense: 30 tablet; Refill: 2  4. Chronic pain of right knee - no physical abnormality or reduced ROM. Will have him see Dr. Jefm Bryant for evaluation, may need to reconnect him to surgeon in future.  5. Hyperlipidemia associated with type 2 diabetes mellitus ([place]) - encouraged continued lifestyle modifications and complete tobacco cessation. Will check lipids in 3 months at follow up. - atorvastatin (LIPITOR) 40 MG tablet; Take 1 tablet (40 mg total) by  mouth daily.  Dispense: 30 tablet; Refill: 2     Chioma E Iloabachie, NP

## 2022-11-05 NOTE — Patient Instructions (Signed)
Smoking Tobacco Information, Adult Smoking tobacco can be harmful to your health. Tobacco contains a toxic colorless chemical called nicotine. Nicotine causes changes in your brain that make you want more and more. This is called addiction. This can make it hard to stop smoking once you start. Tobacco also has other toxic chemicals that can hurt your body and raise your risk of many cancers. Menthol or "lite" tobacco or cigarette brands are not safer than regular brands. How can smoking tobacco affect me? Smoking tobacco puts you at risk for: Cancer. Smoking is most commonly associated with lung cancer, but can also lead to cancer in other parts of the body. Chronic obstructive pulmonary disease (COPD). This is a long-term lung condition that makes it hard to breathe. It also gets worse over time. High blood pressure (hypertension), heart disease, stroke, heart attack, and lung infections, such as pneumonia. Cataracts. This is when the lenses in the eyes become clouded. Digestive problems. This may include peptic ulcers, heartburn, and gastroesophageal reflux disease (GERD). Oral health problems, such as gum disease, mouth sores, and tooth loss. Loss of taste and smell. Smoking also affects how you look and smell. Smoking may cause: Wrinkles. Yellow or stained teeth, fingers, and fingernails. Bad breath. Bad-smelling clothes and hair. Smoking tobacco can also affect your social life, because: It may be challenging to find places to smoke when away from home. Many workplaces, restaurants, hotels, and public places are tobacco-free. Smoking is expensive. This is due to the cost of tobacco and the long-term costs of treating health problems from smoking. Secondhand smoke may affect those around you. Secondhand smoke can cause lung cancer, breathing problems, and heart disease. Children of smokers have a higher risk for: Sudden infant death syndrome (SIDS). Ear infections. Lung infections. What  actions can I take to prevent health problems? Quit smoking  Do not start smoking. Quit if you already smoke. Do not replace cigarette smoking with vaping devices, such as e-cigarettes. Make a plan to quit smoking and commit to it. Look for programs to help you, and ask your health care provider for recommendations and ideas. Set a date and write down all the reasons you want to quit. Let your friends and family know you are quitting so they can help and support you. Consider finding friends who also want to quit. It can be easier to quit with someone else, so that you can support each other. Talk with your health care provider about using nicotine replacement medicines to help you quit. These include gum, lozenges, patches, sprays, or pills. If you try to quit but return to smoking, stay positive. It is common to slip up when you first quit, so take it one day at a time. Be prepared for cravings. When you feel the urge to smoke, chew gum or suck on hard candy. Lifestyle Stay busy. Take care of your body. Get plenty of exercise, eat a healthy diet, and drink plenty of water. Find ways to manage your stress, such as meditation, yoga, exercise, or time spent with friends and family. Ask your health care provider about having regular tests (screenings) to check for cancer. This may include blood tests, imaging tests, and other tests. Where to find support To get support to quit smoking, consider: Asking your health care provider for more information and resources. Joining a support group for people who want to quit smoking in your local community. There are many effective programs that may help you to quit. Calling the smokefree.gov counselor   helpline at 1-800-QUIT-NOW (1-800-784-8669). Where to find more information You may find more information about quitting smoking from: Centers for Disease Control and Prevention: cdc.gov/tobacco Smokefree.gov: smokefree.gov American Lung Association:  freedomfromsmoking.org Contact a health care provider if: You have problems breathing. Your lips, nose, or fingers turn blue. You have chest pain. You are coughing up blood. You feel like you will faint. You have other health changes that cause you to worry. Summary Smoking tobacco can negatively affect your health, the health of those around you, your finances, and your social life. Do not start smoking. Quit if you already smoke. If you need help quitting, ask your health care provider. Consider joining a support group for people in your local community who want to quit smoking. There are many effective programs that may help you to quit. This information is not intended to replace advice given to you by your health care provider. Make sure you discuss any questions you have with your health care provider. Document Revised: 08/05/2021 Document Reviewed: 08/05/2021 Elsevier Patient Education  2023 Elsevier Inc.  

## 2022-11-11 ENCOUNTER — Other Ambulatory Visit: Payer: Self-pay

## 2022-11-12 ENCOUNTER — Other Ambulatory Visit: Payer: Self-pay

## 2022-11-13 ENCOUNTER — Other Ambulatory Visit: Payer: Self-pay

## 2022-11-16 ENCOUNTER — Other Ambulatory Visit: Payer: Self-pay

## 2022-11-17 ENCOUNTER — Other Ambulatory Visit: Payer: Self-pay | Admitting: Gerontology

## 2022-11-17 ENCOUNTER — Other Ambulatory Visit: Payer: Self-pay

## 2022-11-17 DIAGNOSIS — E119 Type 2 diabetes mellitus without complications: Secondary | ICD-10-CM

## 2022-11-17 MED ORDER — UNIFINE PENTIPS 31G X 5 MM MISC
1.0000 | Freq: Every day | 2 refills | Status: DC
  Filled 2022-11-17: qty 100, 25d supply, fill #0
  Filled 2023-01-20: qty 100, 100d supply, fill #0
  Filled 2023-03-22: qty 100, 25d supply, fill #1
  Filled 2023-04-20: qty 100, 25d supply, fill #2

## 2022-12-24 ENCOUNTER — Other Ambulatory Visit: Payer: Self-pay

## 2022-12-25 ENCOUNTER — Other Ambulatory Visit: Payer: Self-pay

## 2022-12-28 ENCOUNTER — Other Ambulatory Visit: Payer: Self-pay

## 2022-12-29 ENCOUNTER — Encounter: Payer: Self-pay | Admitting: Rheumatology

## 2022-12-29 ENCOUNTER — Other Ambulatory Visit: Payer: Self-pay

## 2022-12-29 ENCOUNTER — Ambulatory Visit: Payer: Self-pay | Admitting: Rheumatology

## 2022-12-29 VITALS — BP 141/86 | HR 78 | Temp 98.6°F | Resp 16 | Ht 70.0 in | Wt 218.1 lb

## 2022-12-29 DIAGNOSIS — M1711 Unilateral primary osteoarthritis, right knee: Secondary | ICD-10-CM

## 2022-12-29 NOTE — Patient Instructions (Signed)
Declines injection. Will stay with tylenol and rubs

## 2022-12-29 NOTE — Progress Notes (Signed)
[facility]  PROGRESS NOTE  Patient:Roy Norton Male   DOB:[birth year]     62 y.o.  ZOX:[id number]  Visit Date: 12/29/2022  HPI:  62 year old white male.  Prior she Financial controller.  History of diabetes prior CVA hypertension Major complaint is right knee pain.  Was evaluated 20 years ago with injury he thought he was going to have to have surgery.  Prior injections.  Was seen orthopedics a year or 2 ago and said he needed a "cleanout procedure He has had no injection in the last few years No nonsteroidals because of his hypertension and diabetes [place] that he does not want to injection     Past Medical History:  Diagnosis Date   Diabetes mellitus without complication ([facility])    Edentulous    GERD (gastroesophageal reflux disease)    Hyperlipidemia    Hypertension    Stroke ([facility])    No deficits    Past Surgical History:  Procedure Laterality Date   CARDIAC CATHETERIZATION  2013    Social History   Tobacco Use   Smoking status: Some Days    Packs/day: 0.50    Years: 10.00    Additional pack years: 0.00    Total pack years: 5.00    Types: Cigarettes    Last attempt to quit: 12/18/2015    Years since quitting: 7.0   Smokeless tobacco: Never   Tobacco comments:    Trying to quit again  Substance Use Topics   Alcohol use: No    Alcohol/week: 0.0 standard drinks of alcohol     MEDICATIONS: Current Outpatient Medications  Medication Sig Dispense Refill   amitriptyline (ELAVIL) 50 MG tablet Take 2 tablets (100 mg total) by mouth at bedtime. 60 tablet 2   amLODipine (NORVASC) 5 MG tablet Take 1 tablet (5 mg total) by mouth daily. 30 tablet 2   aspirin 81 MG chewable tablet Chew 1 tablet (81 mg total) by mouth daily. 90 tablet 1   atorvastatin (LIPITOR) 40 MG tablet Take 1 tablet (40 mg total) by mouth daily. 30 tablet 2   butalbital-acetaminophen-caffeine (FIORICET) 50-325-40 MG tablet Take 1 tablet by mouth as needed for headache.     Cetirizine HCl  10 MG CAPS Take 1 capsule (10 mg total) by mouth daily. 30 capsule 2   dapagliflozin propanediol (FARXIGA) 5 MG TABS tablet Take 1 tablet (5 mg total) by mouth daily. 90 tablet 3   fluticasone-salmeterol (WIXELA INHUB) 100-50 MCG/ACT AEPB Inhale 1 puff into the lungs 2 (two) times daily. 60 each 3   fluticasone-salmeterol (WIXELA INHUB) 100-50 MCG/ACT AEPB Inhale into the lungs 2 (two) times daily. 180 each 3   glucose blood (RIGHTEST GS550 BLOOD GLUCOSE) test strip test up to 4 times a day 100 each 3   Insulin Glargine (BASAGLAR KWIKPEN) 100 UNIT/ML Inject 16 Units into the skin at bedtime. 15 mL 5   insulin lispro (HUMALOG KWIKPEN) 100 UNIT/ML KwikPen Inject 0.02 mLs (2 Units total) into the skin 3 (three) times daily with meals. 0-149 = 0 units 150-199= 2 units  200-249= 4 units 250-299 = 6 units 300-349= 8 units 350-399= 10 units 400-449= 12 units and please call clinic to report sugar and insulin taken 15 mL 11   Insulin Pen Needle (UNIFINE PENTIPS) 31G X 5 MM MISC 1 Needle at bedtime. 100 each 2   lisinopril (ZESTRIL) 20 MG tablet TAKE ONE TABLET BY MOUTH ONCE EVERY DAY 90 tablet 3   metFORMIN (GLUCOPHAGE)  500 MG tablet Take 2 tablets (1,000 mg total) by mouth 2 (two) times daily with a meal. 120 tablet 1   metoprolol tartrate (LOPRESSOR) 25 MG tablet TAKE ONE TABLET BY MOUTH 2 TIMES A DAY 180 tablet 0   nitroGLYCERIN (NITROSTAT) 0.4 MG SL tablet Dissolve 1 tablet (0.4 mg total) under the tongue once every 5 (five) minutes as needed for chest pain. If no relief after first dose, call 911. (Max of 3 doses in 15 minutes). 30 tablet 1   pantoprazole (PROTONIX) 20 MG tablet Take 1 tablet (20 mg total) by mouth daily. 30 tablet 2   PROAIR HFA 108 (90 Base) MCG/ACT inhaler INHALE 2 PUFFS BY MOUTH ONCE EVERY 4 HOURS AS NEEDED. REPLACES ALBUTEROL. 25.5 g 3   Rightest GL300 Lancets MISC test up to 4 times a day as directed 100 each 3   topiramate (TOPAMAX) 50 MG tablet Take 50 mg by mouth 2 (two)  times daily.     No current facility-administered medications for this visit.     ALLERGIES No Known Allergies   PHYSICAL EXAM: There were no vitals taken for this visit. Pleasant male.  Right hip moves well left hip moves well.  Right knee is thickened.  No definite Baker's cyst.  Mild laxity.  Mild crepitus with flexion.  Left knee moves well.  Symmetric knee and ankle jerks.  Good distal pulses   ASSESSMENT: Osteoarthritis of the knee Diabetes Hypertension Prior CVA    PLAN: Offered injection, declined, will use topicals and Tylenol.  Not if evidence for cane use May need to get back with orthopedics Encouraged applying for Medicaid    G. Al Pimple. MD           12/29/2022,  10:55 AM

## 2022-12-30 ENCOUNTER — Telehealth: Payer: Self-pay | Admitting: Emergency Medicine

## 2022-12-30 ENCOUNTER — Other Ambulatory Visit: Payer: Self-pay

## 2022-12-30 DIAGNOSIS — H40053 Ocular hypertension, bilateral: Secondary | ICD-10-CM

## 2022-12-30 DIAGNOSIS — E119 Type 2 diabetes mellitus without complications: Secondary | ICD-10-CM

## 2022-12-30 DIAGNOSIS — Z135 Encounter for screening for eye and ear disorders: Secondary | ICD-10-CM

## 2022-12-30 NOTE — Telephone Encounter (Signed)
Called patient to discuss need for referral to [facility] re: elevated pressure in eyes and with diabetes he has a higher risk of Glaucoma.   Spoke with patient's mother, at patient's verbal request, advised of above. Also, advised patient will need to complete a [facility] application. Mailed application to patient and instructed if they need help completing the application to contact Mandy at [facility] and she would help them. Mother agreed and voiced understanding.

## 2023-01-01 ENCOUNTER — Other Ambulatory Visit: Payer: Self-pay

## 2023-01-06 ENCOUNTER — Other Ambulatory Visit: Payer: Self-pay

## 2023-01-06 LAB — HM DIABETES EYE EXAM

## 2023-01-10 ENCOUNTER — Observation Stay
Admission: EM | Admit: 2023-01-10 | Discharge: 2023-01-11 | Disposition: A | Discharge status: Home / Self Care | Payer: Medicaid Other | Attending: Internal Medicine | Admitting: Internal Medicine

## 2023-01-10 ENCOUNTER — Encounter: Payer: Self-pay | Admitting: Emergency Medicine

## 2023-01-10 ENCOUNTER — Emergency Department: Payer: Medicaid Other

## 2023-01-10 ENCOUNTER — Other Ambulatory Visit: Payer: Self-pay

## 2023-01-10 DIAGNOSIS — E669 Obesity, unspecified: Secondary | ICD-10-CM | POA: Diagnosis not present

## 2023-01-10 DIAGNOSIS — I10 Essential (primary) hypertension: Secondary | ICD-10-CM

## 2023-01-10 DIAGNOSIS — Z7982 Long term (current) use of aspirin: Secondary | ICD-10-CM | POA: Insufficient documentation

## 2023-01-10 DIAGNOSIS — Z7984 Long term (current) use of oral hypoglycemic drugs: Secondary | ICD-10-CM | POA: Insufficient documentation

## 2023-01-10 DIAGNOSIS — F17210 Nicotine dependence, cigarettes, uncomplicated: Secondary | ICD-10-CM | POA: Diagnosis not present

## 2023-01-10 DIAGNOSIS — E1169 Type 2 diabetes mellitus with other specified complication: Secondary | ICD-10-CM | POA: Diagnosis present

## 2023-01-10 DIAGNOSIS — R0789 Other chest pain: Principal | ICD-10-CM

## 2023-01-10 DIAGNOSIS — E872 Acidosis, unspecified: Secondary | ICD-10-CM | POA: Insufficient documentation

## 2023-01-10 DIAGNOSIS — R079 Chest pain, unspecified: Secondary | ICD-10-CM | POA: Diagnosis present

## 2023-01-10 DIAGNOSIS — Z8673 Personal history of transient ischemic attack (TIA), and cerebral infarction without residual deficits: Secondary | ICD-10-CM | POA: Diagnosis not present

## 2023-01-10 DIAGNOSIS — Z794 Long term (current) use of insulin: Secondary | ICD-10-CM | POA: Insufficient documentation

## 2023-01-10 DIAGNOSIS — E119 Type 2 diabetes mellitus without complications: Secondary | ICD-10-CM | POA: Diagnosis not present

## 2023-01-10 DIAGNOSIS — E785 Hyperlipidemia, unspecified: Secondary | ICD-10-CM | POA: Diagnosis not present

## 2023-01-10 LAB — CBC
HCT: 47.8 % (ref 39.0–52.0)
Hemoglobin: 15.6 g/dL (ref 13.0–17.0)
MCH: 29.8 pg (ref 26.0–34.0)
MCHC: 32.6 g/dL (ref 30.0–36.0)
MCV: 91.4 fL (ref 80.0–100.0)
Platelets: 210 10*3/uL (ref 150–400)
RBC: 5.23 MIL/uL (ref 4.22–5.81)
RDW: 14 % (ref 11.5–15.5)
WBC: 7.4 10*3/uL (ref 4.0–10.5)
nRBC: 0 % (ref 0.0–0.2)

## 2023-01-10 LAB — BASIC METABOLIC PANEL
Anion gap: 12 (ref 5–15)
BUN: 16 mg/dL (ref 8–23)
CO2: 21 mmol/L — ABNORMAL LOW (ref 22–32)
Calcium: 8.6 mg/dL — ABNORMAL LOW (ref 8.9–10.3)
Chloride: 106 mmol/L (ref 98–111)
Creatinine, Ser: 0.88 mg/dL (ref 0.61–1.24)
GFR, Estimated: >60 mL/min (ref >60)
Glucose, Bld: 162 mg/dL — ABNORMAL HIGH (ref 70–99)
Potassium: 3.8 mmol/L (ref 3.5–5.1)
Sodium: 139 mmol/L (ref 135–145)

## 2023-01-10 LAB — TROPONIN I (HIGH SENSITIVITY)
Troponin I (High Sensitivity): 3 ng/L (ref <18)
Troponin I (High Sensitivity): 3 ng/L (ref <18)

## 2023-01-10 MED ORDER — NITROGLYCERIN 0.4 MG SL SUBL: 0.4000 mg | SUBLINGUAL_TABLET | SUBLINGUAL | Status: DC | PRN

## 2023-01-10 MED ORDER — PANTOPRAZOLE SODIUM 20 MG PO TBEC
20.0000 mg | DELAYED_RELEASE_TABLET | Freq: Every day | ORAL | Status: DC
  Filled 2023-01-10: qty 1

## 2023-01-10 MED ORDER — AMLODIPINE BESYLATE 5 MG PO TABS: 5.0000 mg | ORAL_TABLET | Freq: Every day | ORAL | Status: DC

## 2023-01-10 MED ORDER — ALBUTEROL SULFATE (2.5 MG/3ML) 0.083% IN NEBU: 3.0000 mL | INHALATION_SOLUTION | RESPIRATORY_TRACT | Status: DC | PRN

## 2023-01-10 MED ORDER — ATORVASTATIN CALCIUM 20 MG PO TABS: 40.0000 mg | ORAL_TABLET | Freq: Every day | ORAL | Status: DC

## 2023-01-10 MED ORDER — DAPAGLIFLOZIN PROPANEDIOL 5 MG PO TABS
5.0000 mg | ORAL_TABLET | Freq: Every day | ORAL | Status: DC
  Filled 2023-01-10: qty 1

## 2023-01-10 MED ORDER — LISINOPRIL 10 MG PO TABS
20.0000 mg | ORAL_TABLET | Freq: Every day | ORAL | Status: DC
  Administered 2023-01-10: 20 mg via ORAL
  Filled 2023-01-10: qty 2

## 2023-01-10 MED ORDER — AMITRIPTYLINE HCL 50 MG PO TABS: 100.0000 mg | ORAL_TABLET | Freq: Every day | ORAL | Status: DC

## 2023-01-10 MED ORDER — ASPIRIN 81 MG PO CHEW: 81.0000 mg | CHEWABLE_TABLET | Freq: Every day | ORAL | Status: DC

## 2023-01-10 MED ORDER — METOPROLOL TARTRATE 25 MG PO TABS: 25.0000 mg | ORAL_TABLET | Freq: Two times a day (BID) | ORAL | Status: DC

## 2023-01-10 MED ORDER — ENOXAPARIN SODIUM 60 MG/0.6ML IJ SOSY
0.5000 mg/kg | PREFILLED_SYRINGE | INTRAMUSCULAR | Status: DC
  Administered 2023-01-10: 50 mg via SUBCUTANEOUS
  Filled 2023-01-10: qty 0.6

## 2023-01-10 MED ORDER — INSULIN ASPART 100 UNIT/ML IJ SOLN: 0.0000 [IU] | Freq: Three times a day (TID) | INTRAMUSCULAR | Status: DC

## 2023-01-10 MED ORDER — LISINOPRIL 10 MG PO TABS: 10.0000 mg | ORAL_TABLET | Freq: Every day | ORAL | Status: DC

## 2023-01-10 MED ORDER — MOMETASONE FURO-FORMOTEROL FUM 100-5 MCG/ACT IN AERO: 2.0000 | INHALATION_SPRAY | Freq: Two times a day (BID) | RESPIRATORY_TRACT | Status: DC

## 2023-01-10 NOTE — H&P (Signed)
History and Physical    Roy Norton: Roy Norton:[id number] DOB: 03-Jul-1961 DOA: 01/10/2023 DOS: the Roy Norton was seen and examined on 01/10/2023 PCP: Roy Norton, No Pcp Per  Roy Norton coming from: Home  Chief Complaint:  Chief Complaint  Roy Norton presents with   Chest Pain   HPI: DAMARIEN Norton is a 62 y.o. male with medical history significant of essential hypertension, type 2 diabetes, acid reflux, history of stroke, who present to the hospital with chest pain. Roy Norton woke up this morning at 8 AM, he stretched his bilateral arms really hard, then experienced left-sided chest pain.  The pain localized in the left breast, sharp and stabbing-like, 5/10 in severity, no radiation.  Each time the pain lasted about 2 or 3 minutes, he had a 3-4 episodes during half hour..  Then resolved.  Roy Norton was associated with shortness of breath.  It was also worse with deep breath.  Troponin was negative, chest x-ray has no acute changes.  Roy Norton was admitted to the hospital for observation. Review of Systems: As mentioned in the history of present illness. All other systems reviewed and are negative. Past Medical History:  Diagnosis Date   Diabetes mellitus without complication (HCC)    Edentulous    GERD (gastroesophageal reflux disease)    Hyperlipidemia    Hypertension    Stroke (HCC)    No deficits   Past Surgical History:  Procedure Laterality Date   CARDIAC CATHETERIZATION  2013   Social History:  reports that he has been smoking cigarettes. He has a 5.00 pack-year smoking history. He has never used smokeless tobacco. He reports that he does not drink alcohol and does not use drugs.  No Known Allergies  Family History  Problem Relation Age of Onset   Hypertension Mother    Hypertension Father    Diabetes Father     Prior to Admission medications   Medication Sig Start Date End Date Taking? Authorizing Provider  amitriptyline (ELAVIL) 50 MG tablet Take 2 tablets (100 mg total)  by mouth at bedtime. 03/07/20   Iloabachie, Chioma E, NP  amLODipine (NORVASC) 5 MG tablet Take 1 tablet (5 mg total) by mouth daily. 11/05/22 11/05/23  Iloabachie, Chioma E, NP  aspirin 81 MG chewable tablet Chew 1 tablet (81 mg total) by mouth daily. 03/07/20   Iloabachie, Chioma E, NP  atorvastatin (LIPITOR) 40 MG tablet Take 1 tablet (40 mg total) by mouth daily. 11/05/22 11/05/23  Iloabachie, Chioma E, NP  butalbital-acetaminophen-caffeine (FIORICET) 50-325-40 MG tablet Take 1 tablet by mouth as needed for headache.    [provider]  Cetirizine HCl 10 MG CAPS Take 1 capsule (10 mg total) by mouth daily. 04/18/20   Mike Gip, FNP  dapagliflozin propanediol (FARXIGA) 5 MG TABS tablet Take 1 tablet (5 mg total) by mouth daily. 08/27/22   Iloabachie, Chioma E, NP  fluticasone-salmeterol (WIXELA INHUB) 100-50 MCG/ACT AEPB Inhale 1 puff into the lungs 2 (two) times daily. 06/11/22   Iloabachie, Chioma E, NP  fluticasone-salmeterol (WIXELA INHUB) 100-50 MCG/ACT AEPB Inhale into the lungs 2 (two) times daily. 07/14/22   Iloabachie, Chioma E, NP  glucose blood (RIGHTEST GS550 BLOOD GLUCOSE) test strip test up to 4 times a day 07/23/22   Iloabachie, Chioma E, NP  Insulin Glargine (BASAGLAR KWIKPEN) 100 UNIT/ML Inject 16 Units into the skin at bedtime. 06/11/22   Iloabachie, Chioma E, NP  insulin lispro (HUMALOG KWIKPEN) 100 UNIT/ML KwikPen Inject 0.02 mLs (2 Units total) into the skin 3 (three)  times daily with meals. 0-149 = 0 units 150-199= 2 units  200-249= 4 units 250-299 = 6 units 300-349= 8 units 350-399= 10 units 400-449= 12 units and please call clinic to report sugar and insulin taken Roy Norton not taking: Reported on 12/29/2022 02/12/22   Glenna Fellows, FNP  Insulin Pen Needle (UNIFINE PENTIPS) 31G X 5 MM MISC 1 Needle at bedtime. 11/17/22   Iloabachie, Chioma E, NP  lisinopril (ZESTRIL) 20 MG tablet TAKE ONE TABLET BY MOUTH ONCE EVERY DAY 02/05/22 02/11/23  Iloabachie, Chioma E, NP  metFORMIN  (GLUCOPHAGE) 500 MG tablet Take 2 tablets (1,000 mg total) by mouth 2 (two) times daily with a meal. 08/06/22     metoprolol tartrate (LOPRESSOR) 25 MG tablet TAKE ONE TABLET BY MOUTH 2 TIMES A DAY 04/02/22 04/02/23  Iloabachie, Chioma E, NP  nitroGLYCERIN (NITROSTAT) 0.4 MG SL tablet Dissolve 1 tablet (0.4 mg total) under the tongue once every 5 (five) minutes as needed for chest pain. If no relief after first dose, call 911. (Max of 3 doses in 15 minutes). 05/27/21   Iloabachie, Chioma E, NP  pantoprazole (PROTONIX) 20 MG tablet Take 1 tablet (20 mg total) by mouth daily. 02/05/22 02/05/23  Iloabachie, Chioma E, NP  PROAIR HFA 108 (90 Base) MCG/ACT inhaler INHALE 2 PUFFS BY MOUTH ONCE EVERY 4 HOURS AS NEEDED. REPLACES ALBUTEROL. 09/16/21 09/16/22  Iloabachie, Chioma E, NP  Rightest GL300 Lancets MISC test up to 4 times a day as directed 07/23/22   Iloabachie, Chioma E, NP  topiramate (TOPAMAX) 50 MG tablet Take 50 mg by mouth 2 (two) times daily.    [provider]    Physical Exam: Vitals:   01/10/23 1109 01/10/23 1110 01/10/23 1450  BP: 130/77  127/88  Pulse: 66  (!) 58  Resp: 16  16  Temp: 98.2 F (36.8 C)    TempSrc: Oral    SpO2: 97%  97%  Weight:  97.5 kg   Height:  5\' 10"  (1.778 m)    Physical Exam Constitutional:      General: He is not in acute distress.    Appearance: He is obese. He is not ill-appearing or toxic-appearing.  Eyes:     Extraocular Movements: Extraocular movements intact.     Pupils: Pupils are equal, round, and reactive to light.  Neck:     Thyroid: No thyromegaly.     Vascular: No hepatojugular reflux.  Cardiovascular:     Rate and Rhythm: Normal rate and regular rhythm.     Heart sounds: Normal heart sounds.  Pulmonary:     Effort: Pulmonary effort is normal. No respiratory distress.  Chest:     Chest wall: No tenderness.  Abdominal:     General: Bowel sounds are normal. There is no abdominal bruit.     Palpations: Abdomen is soft. There is no  hepatomegaly or mass.  Musculoskeletal:        General: Normal range of motion.     Cervical back: Normal range of motion.     Right lower leg: No edema.     Left lower leg: No edema.  Skin:    General: Skin is warm and dry.     Coloration: Skin is not cyanotic.  Neurological:     General: No focal deficit present.     Mental Status: He is alert and oriented to person, place, and time.     Cranial Nerves: No cranial nerve deficit.  Psychiatric:  Mood and Affect: Mood normal.        Behavior: Behavior normal.     Data Reviewed:  Chest x-ray normal, troponin not elevated.  Assessment and Plan: Atypical chest pain. Roy Norton has multiple risk factors for heart disease including essential hypertension, dyslipidemia, obesity, current smoker.  But chest pain is atypical.  Troponin was negative.  Will continue to monitor overnight.  ED has consulted cardiology.  Obesity. Diet exercise advised  Tobacco abuse. Advised to quit  Essential hypertension. Continue home medicines  Type 2 diabetes I will continue some status of insulin, plan to resume home medicines at time of discharge.    Advance Care Planning:   Code Status: Full Code Roy Norton is a full code.  Consults: Cardiology  Family Communication: Sister at the bedside.  Severity of Illness: The appropriate Roy Norton status for this Roy Norton is OBSERVATION. Observation status is judged to be reasonable and necessary in order to provide the required intensity of service to ensure the Roy Norton's safety. The Roy Norton's presenting symptoms, physical exam findings, and initial radiographic and laboratory data in the context of their medical condition is felt to place them at decreased risk for further clinical deterioration. Furthermore, it is anticipated that the Roy Norton will be medically stable for discharge from the hospital within 2 midnights of admission.   Author: Marrion Coy, MD 01/10/2023 3:35 PM  For on call review  [email or web address].

## 2023-01-10 NOTE — ED Provider Notes (Signed)
[facility] Provider Note    Event Date/Time   First MD Initiated Contact with Patient 01/10/23 1415     (approximate)   History   Chest Pain   HPI  Roy Norton is a 62 y.o. male with history of diabetes, hypertension, hyperlipidemia, tobacco use presenting to the emergency department for evaluation of chest pain.  Patient was awakened from his sleep this morning around 8 AM with onset of chest pain described as a sharp pressure over the left side of his chest, nonradiating.  Pain was worse when he was up and walking and with exertion.  Denies history of similar.  He reports he was given nitro by his doctor for chest pain previously.  He took 1 without benefit.  He called EMS and was given 2 nitroglycerin sprays with improvement in his chest pain, has since resolved since being in the ER.  Last stress test was a few years ago.      Physical Exam   Triage Vital Signs: ED Triage Vitals  Enc Vitals Group     BP 01/10/23 1109 130/77     Pulse Rate 01/10/23 1109 66     Resp 01/10/23 1109 16     Temp 01/10/23 1109 98.2 F (36.8 C)     Temp Source 01/10/23 1109 Oral     SpO2 01/10/23 1109 97 %     Weight 01/10/23 1110 215 lb (97.5 kg)     Height 01/10/23 1110 5\' 10"  (1.778 m)     Head Circumference --      Peak Flow --      Pain Score 01/10/23 1109 2     Pain Loc --      Pain Edu? --      Excl. in GC? --     Most recent vital signs: Vitals:   01/10/23 1109 01/10/23 1450  BP: 130/77 127/88  Pulse: 66 (!) 58  Resp: 16 16  Temp: 98.2 F (36.8 C)   SpO2: 97% 97%     General: Awake, interactive  CV:  Regular rate, good peripheral perfusion.  Resp:  Lungs clear, unlabored respirations.  Abd:  Soft, nondistended.  Neuro:  Symmetric facial movement, fluid speech   ED Results / Procedures / Treatments   Labs (all labs ordered are listed, but only abnormal results are displayed) Labs Reviewed  BASIC METABOLIC PANEL - Abnormal; Notable  for the following components:      Result Value   CO2 21 (*)    Glucose, Bld 162 (*)    Calcium 8.6 (*)    All other components within normal limits  CBC  HIV ANTIBODY (ROUTINE TESTING W REFLEX)  TROPONIN I (HIGH SENSITIVITY)  TROPONIN I (HIGH SENSITIVITY)     EKG EKG independently reviewed interpreted by myself (ER attending) demonstrates:  EKG demonstrates normal sinus rhythm rate 70, PR 166, QRS 92, QTc 440, no acute ST changes  HEART SCORE:    History:               Highly Suspicious - 2                               Moderately Suspicious - 1                               Slightly Suspicious - 0  ECG  Significant ST-deviation - 2                               Nonspecific repolarization - 1                               Normal - 0  Age                      > 45 Years Old         - 2                               >74 and <49 Years Old - 1                               <26 years old - 0  Risk Factors       3 risk factors or history or known disease - 2                              1 or 2 risk factors - 1                              No risk factors known - 0  Troponin            3 times the normal limit - 2                              >1 and <3 times the normal limit - 1                              1 times the normal limit - 0 Total Score:     4  RADIOLOGY Imaging independently reviewed and interpreted by myself demonstrates:  CXR without pneumonia or pneumothorax  PROCEDURES:  Critical Care performed: No  Procedures   MEDICATIONS ORDERED IN ED: Medications  aspirin chewable tablet 81 mg (has no administration in time range)  amLODipine (NORVASC) tablet 5 mg (has no administration in time range)  atorvastatin (LIPITOR) tablet 40 mg (has no administration in time range)  lisinopril (ZESTRIL) tablet 10 mg (has no administration in time range)  metoprolol tartrate (LOPRESSOR) tablet 25 mg (has no administration in time range)  nitroGLYCERIN  (NITROSTAT) SL tablet 0.4 mg (has no administration in time range)  amitriptyline (ELAVIL) tablet 100 mg (has no administration in time range)  dapagliflozin propanediol (FARXIGA) tablet 5 mg (has no administration in time range)  pantoprazole (PROTONIX) EC tablet 20 mg (has no administration in time range)  mometasone-formoterol (DULERA) 100-5 MCG/ACT inhaler 2 puff (has no administration in time range)  albuterol (PROVENTIL) (2.5 MG/3ML) 0.083% nebulizer solution 3 mL (has no administration in time range)  insulin aspart (novoLOG) injection 0-9 Units (has no administration in time range)  enoxaparin (LOVENOX) injection 50 mg (has no administration in time range)     IMPRESSION / MDM / ASSESSMENT AND PLAN / ED COURSE  I reviewed the triage vital signs and the nursing notes.  Differential diagnosis includes,  but is not limited to, ACS, unstable angina, low suspicion PE in the absence of shortness of breath and with resolution of symptoms, consideration for pneumonia, pneumothorax  Patient's presentation is most consistent with acute presentation with potential threat to life or bodily function.  62 year old male presenting to the emergency department for evaluation of chest pain.  Initial workup is overall reassuring, but clinical history of acute onset exertional chest pain with multiple risk factors is concerning.  Moderate risk heart score.  Will reach out to hospitalist team to discuss possible admission.  Case this with hospitalist team.  They will evaluate the patient for anticipated admission.  Did request that I contact cardiology to arrange stress test.  Spoke with Dr. Duke Salvia who will attempt to coordinate this for tomorrow.      FINAL CLINICAL IMPRESSION(S) / ED DIAGNOSES   Final diagnoses:  Acute chest pain     Rx / DC Orders   ED Discharge Orders     None        Note:  This document was prepared using Dragon voice recognition software and may include  unintentional dictation errors.   Trinna Post, MD 01/10/23 1539

## 2023-01-10 NOTE — ED Triage Notes (Signed)
Pt to ED via [facility] from home for chest pain. Pt states that the pain has now resolved. Pt reports that the pain comes and goes. Pt states that pain started this morning around 0800. Pt denies any other symptoms associated with the pain. Pt is in NAD.

## 2023-01-10 NOTE — ED Triage Notes (Signed)
Pt in via EMS from home with c/o CP since 0800. No hx of MI, Took a nitro pill and 324mg  of asa and the pain decreased from a 5 to a 2.  EMS reports gave 2 nitro sprays on the way here. # 18g to left [facility]

## 2023-01-11 ENCOUNTER — Observation Stay: Payer: Medicaid Other

## 2023-01-11 ENCOUNTER — Other Ambulatory Visit: Payer: Self-pay

## 2023-01-11 DIAGNOSIS — R0789 Other chest pain: Secondary | ICD-10-CM | POA: Diagnosis not present

## 2023-01-11 DIAGNOSIS — I10 Essential (primary) hypertension: Secondary | ICD-10-CM | POA: Diagnosis not present

## 2023-01-11 DIAGNOSIS — E119 Type 2 diabetes mellitus without complications: Secondary | ICD-10-CM | POA: Diagnosis not present

## 2023-01-11 NOTE — Discharge Summary (Signed)
Physician Discharge Summary   Patient: Roy Norton MRN: [id number] DOB: 04/29/1961  Admit date:     01/10/2023  Discharge date: 01/11/23  Discharge Physician: Marrion Coy   PCP: Patient, No Pcp Per   Recommendations at discharge:   Follow-up with cardiology in 1 week for outpatient workup.  Discharge Diagnoses: Principal Problem:   Chest pain Active Problems:   Hyperlipidemia   Essential hypertension   Diabetes mellitus without complication (HCC)   Obesity (BMI 30-39.9) Minimal metabolic acidosis. Resolved Problems:   * No resolved hospital problems. *  Hospital Course: ILIJA CHEZEM is a 62 y.o. male with medical history significant of essential hypertension, type 2 diabetes, acid reflux, history of stroke, who present to the hospital with chest pain. Patient woke up this morning at 8 AM, he stretched his bilateral arms really hard, then experienced left-sided chest pain.   Patient EKG does not have any ischemic changes, chest x-ray was normal, troponin negative. Discussed with Dr. Kirke Corin, patient is low risk due to no EKG changes, normal troponin.  He would not be able to see patient until in afternoon, patient does not want to stay.  Patient also refused stress test. At this point, he will be discharged to home.  Assessment and Plan: Atypical chest pain. Patient has multiple risk factors for heart disease including essential hypertension, dyslipidemia, obesity, current smoker.  But chest pain is atypical.  Troponin was negative.  Chest pain most likely was skeletal muscular due to overstretching of the arms. He is monitored overnight, no recurrence of chest pain.  Patient refused stress test, okay to have outpatient workup.  Obesity. Diet exercise advised   Tobacco abuse. Advised to quit   Essential hypertension. Continue home medicines   Type 2 diabetes Resume home treatment.        Consultants: None Procedures performed: None  Disposition: Home Diet  recommendation:  Discharge Diet Orders (From admission, onward)     Start     Ordered   01/11/23 0000  Diet - low sodium heart healthy        01/11/23 0902           Cardiac diet DISCHARGE MEDICATION: Allergies as of 01/11/2023   No Known Allergies      Medication List     STOP taking these medications    amitriptyline 50 MG tablet Commonly known as: ELAVIL   butalbital-acetaminophen-caffeine 50-325-40 MG tablet Commonly known as: FIORICET   Cetirizine HCl 10 MG Caps   metoprolol tartrate 25 MG tablet Commonly known as: LOPRESSOR   topiramate 50 MG tablet Commonly known as: TOPAMAX       TAKE these medications    amLODipine 5 MG tablet Commonly known as: NORVASC Take 1 tablet (5 mg total) by mouth daily.   aspirin 81 MG chewable tablet Chew 1 tablet (81 mg total) by mouth daily.   atorvastatin 40 MG tablet Commonly known as: LIPITOR Take 1 tablet (40 mg total) by mouth daily.   Basaglar KwikPen 100 UNIT/ML Inject 16 Units into the skin at bedtime.   Farxiga 5 MG Tabs tablet Generic drug: dapagliflozin propanediol Take 1 tablet (5 mg total) by mouth daily.   HumaLOG KwikPen 100 UNIT/ML KwikPen Generic drug: insulin lispro Inject 2 Units into the skin 3 (three) times daily with meals. 0-149 = 0 units 150-199= 2 units  200-249= 4 units 250-299 = 6 units 300-349= 8 units 350-399= 10 units 400-449= 12 units and please call clinic to report sugar  and insulin taken (Inject 0.02 mLs (2 Units total) into the skin 3 (three) times daily with meals. 0-149 = 0 units 150-199= 2 units  200-249= 4 units 250-299 = 6 units 300-349= 8 units 350-399= 10 units 400-449= 12 units and please call clinic to report sugar and insulin taken)   lisinopril 20 MG tablet Commonly known as: ZESTRIL TAKE ONE TABLET BY MOUTH ONCE EVERY DAY   metFORMIN 500 MG tablet Commonly known as: GLUCOPHAGE Take 2 tablets (1,000 mg total) by mouth 2 (two) times daily with a meal.    nitroGLYCERIN 0.4 MG SL tablet Commonly known as: NITROSTAT Dissolve 1 tablet (0.4 mg total) under the tongue once every 5 (five) minutes as needed for chest pain. If no relief after first dose, call 911. (Max of 3 doses in 15 minutes).   pantoprazole 20 MG tablet Commonly known as: PROTONIX Take 1 tablet (20 mg total) by mouth daily.   ProAir HFA 108 (90 Base) MCG/ACT inhaler Generic drug: albuterol INHALE 2 PUFFS BY MOUTH ONCE EVERY 4 HOURS AS NEEDED. REPLACES ALBUTEROL.   Rightest GL300 Lancets Misc test up to 4 times a day as directed   Rightest GS550 Blood Glucose test strip Generic drug: glucose blood test up to 4 times a day   Unifine Pentips 31G X 5 MM Misc Generic drug: Insulin Pen Needle Use as directed at bedtime (1 Needle at bedtime.)   Wixela Inhub 100-50 MCG/ACT Aepb Generic drug: fluticasone-salmeterol Inhale into the lungs 2 (two) times daily. What changed: Another medication with the same name was removed. Continue taking this medication, and follow the directions you see here.        Follow-up Information     End, Cristal Deer, MD Follow up in 1 week(s).   Specialty: Cardiology Contact information: [address] [place] [address] [phone]                Discharge Exam: Ceasar Mons Weights   01/10/23 1110  Weight: 97.5 kg   General exam: Appears calm and comfortable  Respiratory system: Clear to auscultation. Respiratory effort normal. Cardiovascular system: S1 & S2 heard, RRR. No JVD, murmurs, rubs, gallops or clicks. No pedal edema. Gastrointestinal system: Abdomen is nondistended, soft and nontender. No organomegaly or masses felt. Normal bowel sounds heard. Central nervous system: Alert and oriented. No focal neurological deficits. Extremities: Symmetric 5 x 5 power. Skin: No rashes, lesions or ulcers Psychiatry: Judgement and insight appear normal. Mood & affect appropriate.    Condition at discharge: good  The  results of significant diagnostics from this hospitalization (including imaging, microbiology, ancillary and laboratory) are listed below for reference.   Imaging Studies: DG Chest 2 View  Result Date: 01/10/2023 CLINICAL DATA:  Acute chest pain beginning several hours ago. EXAM: CHEST - 2 VIEW COMPARISON:  04/10/2020 FINDINGS: The heart size and mediastinal contours are within normal limits. Both lungs are clear. The visualized skeletal structures are unremarkable. IMPRESSION: No active cardiopulmonary disease. Electronically Signed   By: Danae Orleans M.D.   On: 01/10/2023 11:30    Microbiology: Results for orders placed or performed during the hospital encounter of 04/24/20  SARS CORONAVIRUS 2 (TAT 6-24 HRS) Nasopharyngeal Nasopharyngeal Swab     Status: None   Collection Time: 04/24/20 11:01 AM   Specimen: Nasopharyngeal Swab  Result Value Ref Range Status   SARS Coronavirus 2 NEGATIVE NEGATIVE Final    Comment: (NOTE) SARS-CoV-2 target nucleic acids are NOT DETECTED.  The SARS-CoV-2 RNA  is generally detectable in upper and lower respiratory specimens during the acute phase of infection. Negative results do not preclude SARS-CoV-2 infection, do not rule out co-infections with other pathogens, and should not be used as the sole basis for treatment or other patient management decisions. Negative results must be combined with clinical observations, patient history, and epidemiological information. The expected result is Negative.  Fact Sheet for Patients: HairSlick.no  Fact Sheet for Healthcare Providers: quierodirigir.com  This test is not yet approved or cleared by the [place] FDA and  has been authorized for detection and/or diagnosis of SARS-CoV-2 by FDA under an Emergency Use Authorization (EUA). This EUA will remain  in effect (meaning this test can be used) for the duration of the COVID-19 declaration under Se ction  564(b)(1) of the Act, 21 U.S.C. section 360bbb-3(b)(1), unless the authorization is terminated or revoked sooner.  Performed at [facility], [address], [place], [place] [address]     Labs: CBC: Recent Labs  Lab 01/10/23 1114  WBC 7.4  HGB 15.6  HCT 47.8  MCV 91.4  PLT 210   Basic Metabolic Panel: Recent Labs  Lab 01/10/23 1114  NA 139  K 3.8  CL 106  CO2 21*  GLUCOSE 162*  BUN 16  CREATININE 0.88  CALCIUM 8.6*   Liver Function Tests: No results for input(s): "AST", "ALT", "ALKPHOS", "BILITOT", "PROT", "ALBUMIN" in the last 168 hours. CBG: No results for input(s): "GLUCAP" in the last 168 hours.  Discharge time spent: greater than 30 minutes.  Signed: Marrion Coy, MD [facility] 01/11/2023

## 2023-01-11 NOTE — ED Notes (Signed)
Pt discharge to home. Pt VSS, GCS 15, NAD. Pt verbalized understanding of discharge instructions with no additional questions at this time.  

## 2023-01-11 NOTE — ED Notes (Signed)
Assumed care from Alex,RN. Pt resting comfortably in bed at this time. Pt denies any current needs or questions. Call light with in reach.   

## 2023-01-18 ENCOUNTER — Other Ambulatory Visit: Payer: Self-pay

## 2023-01-18 ENCOUNTER — Other Ambulatory Visit: Payer: Self-pay | Admitting: Gerontology

## 2023-01-18 DIAGNOSIS — K219 Gastro-esophageal reflux disease without esophagitis: Secondary | ICD-10-CM

## 2023-01-19 ENCOUNTER — Other Ambulatory Visit: Payer: Self-pay

## 2023-01-19 MED FILL — Pantoprazole Sodium EC Tab 20 MG (Base Equiv): ORAL | 30 days supply | Qty: 30 | Fill #0 | Status: AC

## 2023-01-20 ENCOUNTER — Telehealth: Payer: Self-pay

## 2023-01-20 ENCOUNTER — Other Ambulatory Visit: Payer: Self-pay

## 2023-01-20 NOTE — Telephone Encounter (Signed)
Pt's called and needed info for the eye center he was referred to. Gave phone number to pt. Pt verbalized understanding.

## 2023-01-21 ENCOUNTER — Other Ambulatory Visit: Payer: Self-pay

## 2023-01-22 ENCOUNTER — Other Ambulatory Visit: Payer: Self-pay

## 2023-02-04 ENCOUNTER — Encounter: Payer: Self-pay | Admitting: Gerontology

## 2023-02-04 ENCOUNTER — Other Ambulatory Visit: Payer: Self-pay

## 2023-02-04 ENCOUNTER — Ambulatory Visit: Payer: Self-pay | Admitting: Gerontology

## 2023-02-04 VITALS — BP 113/82 | HR 87 | Temp 98.1°F | Resp 16 | Ht 70.0 in | Wt 217.0 lb

## 2023-02-04 DIAGNOSIS — L989 Disorder of the skin and subcutaneous tissue, unspecified: Secondary | ICD-10-CM | POA: Insufficient documentation

## 2023-02-04 DIAGNOSIS — I10 Essential (primary) hypertension: Secondary | ICD-10-CM

## 2023-02-04 DIAGNOSIS — E1169 Type 2 diabetes mellitus with other specified complication: Secondary | ICD-10-CM

## 2023-02-04 DIAGNOSIS — E119 Type 2 diabetes mellitus without complications: Secondary | ICD-10-CM

## 2023-02-04 DIAGNOSIS — Z09 Encounter for follow-up examination after completed treatment for conditions other than malignant neoplasm: Secondary | ICD-10-CM

## 2023-02-04 LAB — GLUCOSE, POCT (MANUAL RESULT ENTRY): POC Glucose: 159 mg/dl — AB (ref 70–99)

## 2023-02-04 LAB — POCT GLYCOSYLATED HEMOGLOBIN (HGB A1C): Hemoglobin A1C: 7.9 % — AB (ref 4.0–5.6)

## 2023-02-04 MED ORDER — LISINOPRIL 20 MG PO TABS
ORAL_TABLET | Freq: Every day | ORAL | 3 refills | Status: AC
Start: 2023-02-04 — End: 2024-02-04
  Filled 2023-02-04: qty 90, fill #0
  Filled 2023-04-20: qty 90, 90d supply, fill #0
  Filled 2023-07-07: qty 30, 30d supply, fill #1
  Filled 2023-08-13 (×2): qty 30, 30d supply, fill #2
  Filled 2023-09-08: qty 30, 30d supply, fill #3
  Filled 2023-10-12 – 2023-10-13 (×2): qty 30, 30d supply, fill #4
  Filled 2023-11-24: qty 30, 30d supply, fill #5
  Filled 2024-01-04: qty 30, 30d supply, fill #6

## 2023-02-04 MED ORDER — AMLODIPINE BESYLATE 5 MG PO TABS
5.0000 mg | ORAL_TABLET | Freq: Every day | ORAL | 2 refills | Status: AC
Start: 2023-02-04 — End: 2024-02-04
  Filled 2023-02-04 – 2023-02-23 (×2): qty 30, 30d supply, fill #0
  Filled 2023-03-22: qty 30, 30d supply, fill #1
  Filled 2023-04-20: qty 30, 30d supply, fill #2

## 2023-02-04 MED ORDER — ATORVASTATIN CALCIUM 40 MG PO TABS
40.0000 mg | ORAL_TABLET | Freq: Every day | ORAL | 2 refills | Status: AC
Start: 2023-02-04 — End: 2024-02-04
  Filled 2023-02-04 – 2023-02-23 (×2): qty 30, 30d supply, fill #0
  Filled 2023-03-22: qty 30, 30d supply, fill #1
  Filled 2023-04-20: qty 30, 30d supply, fill #2

## 2023-02-04 NOTE — Progress Notes (Signed)
   Established Patient Office Visit  Subjective   Patient ID: Roy Norton, male    DOB: 07/03/1961  Age: 62 y.o. MRN: [id number]  No chief complaint on file.   HPI Roy Norton is a 62 y.o. male with history of diabetes, hypertension, hyperlipidemia, tobacco use who  presents today follow up. He was seen  at the [facility] ED by Dr. Rosalia Hammers on 01/10/23 for an Acute Chest pain.  ROS    Objective:     There were no vitals taken for this visit. BP Readings from Last 3 Encounters:  01/11/23 (!) 137/94  12/29/22 (!) 141/86  11/05/22 125/86   Wt Readings from Last 3 Encounters:  01/10/23 215 lb (97.5 kg)  12/29/22 218 lb 1.6 oz (98.9 kg)  11/05/22 217 lb 9.6 oz (98.7 kg)      Physical Exam   No results found for any visits on 02/04/23.  Last CBC Lab Results  Component Value Date   WBC 7.4 01/10/2023   HGB 15.6 01/10/2023   HCT 47.8 01/10/2023   MCV 91.4 01/10/2023   MCH 29.8 01/10/2023   RDW 14.0 01/10/2023   PLT 210 01/10/2023   Last metabolic panel Lab Results  Component Value Date   GLUCOSE 162 (H) 01/10/2023   NA 139 01/10/2023   K 3.8 01/10/2023   CL 106 01/10/2023   CO2 21 (L) 01/10/2023   BUN 16 01/10/2023   CREATININE 0.88 01/10/2023   GFRNONAA >60 01/10/2023   CALCIUM 8.6 (L) 01/10/2023   PROT 7.6 08/06/2022   ALBUMIN 4.6 08/06/2022   LABGLOB 3.0 08/06/2022   AGRATIO 1.5 08/06/2022   BILITOT 0.4 08/06/2022   ALKPHOS 90 08/06/2022   AST 24 08/06/2022   ALT 30 08/06/2022   ANIONGAP 12 01/10/2023   Last lipids Lab Results  Component Value Date   CHOL 148 09/03/2022   HDL 37 (L) 09/03/2022   LDLCALC 76 09/03/2022   TRIG 208 (H) 09/03/2022   CHOLHDL 4.0 09/03/2022   Last hemoglobin A1c Lab Results  Component Value Date   HGBA1C 8.3 (A) 11/05/2022   Last thyroid functions Lab Results  Component Value Date   TSH 1.770 08/27/2021   T4TOTAL 9.1 03/06/2020   Last vitamin D No results found for: "25OHVITD2", "25OHVITD3", "VD25OH" Last  vitamin B12 and Folate No results found for: "VITAMINB12", "FOLATE"    The ASCVD Risk score (Arnett DK, et al., 2019) failed to calculate for the following reasons:   The patient has a prior MI or stroke diagnosis    Assessment & Plan:   Problem List Items Addressed This Visit   None   No follow-ups on file.    Mickie Hillier, FNP

## 2023-02-04 NOTE — Patient Instructions (Signed)

## 2023-02-04 NOTE — Progress Notes (Signed)
-  Established Patient Office Visit  Subjective   Patient ID: Roy Norton, male    DOB: 1961-08-19  Age: 62 y.o. MRN: [id number]  Chief Complaint  Patient presents with   Follow-up    Patient has appointment with [facility] on 07/27/23.   Diabetes    Diabetes   HPI Roy Norton is a 62 year old male who has history of type 2 diabetes, GERD, hyperlipidemia, hypertension, stroke and presents for follow up visit.   He states that he's compliant with his medications, denies side effects and continues to make healthy lifestyle changes. He was seen at the ED on 01/10/2023 with chest pain, EKG does not have any ischemic changes, chest x-ray was normal, troponin negative and has not followed up with Cardiologist .  He denies chest pain, palpitation and light headedness. His HgbA1c checked during visit decreased from  8.3% to 7.9%  and his blood glucose was  159 mg/dl. He states that he checks his blood glucose bid, an his readings ranges between  99 mg/dl -244 mg/dl.  He has a dime sized lesion in the middle of his scalp, he denies erythema, hair loss to sight and soreness.  He states that lesion has been present  for approximately 2 years.  He  states that lesion has not worsened , and lesion resolves with applying  Neosporin , but lesion erupts  immediately after within 2-5 days.  Overall, he states that he's doing well and offers no further complaint.     Review of Systems  Constitutional: Negative.   HENT: Negative.    Eyes: Negative.   Respiratory: Negative.    Cardiovascular: Negative.   Gastrointestinal: Negative.   Genitourinary: Negative.   Musculoskeletal: Negative.   Skin:        Dime sized lesion to scalp  Neurological: Negative.   Psychiatric/Behavioral: Negative.        Objective:     BP 113/82 (BP Location: Right Arm, Patient Position: Sitting, Cuff Size: Large)   Pulse 87   Temp 98.1 F (36.7 C) (Oral)   Resp 16   Ht 5\' 10"  (1.778 m)   Wt 217 lb  (98.4 kg)   SpO2 93%   BMI 31.14 kg/m  BP Readings from Last 3 Encounters:  02/04/23 113/82  01/11/23 (!) 137/94  12/29/22 (!) 141/86   Wt Readings from Last 3 Encounters:  02/04/23 217 lb (98.4 kg)  01/10/23 215 lb (97.5 kg)  12/29/22 218 lb 1.6 oz (98.9 kg)    Physical Exam HENT:     Head:     Comments: Dime sized lesion in the center of head with redness, soreness, and hair loss around it.  Patient reports he has had this for approximately 2 years.  He reports he puts Neosporin on it with resolve; the bump returns immediately after within 2-5 days. Cardiovascular:     Rate and Rhythm: Regular rhythm.     Heart sounds: Normal heart sounds.  Pulmonary:     Breath sounds: Normal breath sounds.  Abdominal:     General: Bowel sounds are normal.  Musculoskeletal:     Cervical back: Normal range of motion.  Skin:    General: Skin is warm and dry.  Neurological:     Mental Status: He is alert and oriented to person, place, and time.  Psychiatric:        Mood and Affect: Mood normal.      Results for orders placed or performed in visit  on 02/04/23  POCT Glucose (CBG)  Result Value Ref Range   POC Glucose 159 (A) 70 - 99 mg/dl  POCT HgB Z6X  Result Value Ref Range   Hemoglobin A1C 7.9 (A) 4.0 - 5.6 %   HbA1c POC (<> result, manual entry)     HbA1c, POC (prediabetic range)     HbA1c, POC (controlled diabetic range)      Last CBC Lab Results  Component Value Date   WBC 7.4 01/10/2023   HGB 15.6 01/10/2023   HCT 47.8 01/10/2023   MCV 91.4 01/10/2023   MCH 29.8 01/10/2023   RDW 14.0 01/10/2023   PLT 210 01/10/2023   Last metabolic panel Lab Results  Component Value Date   GLUCOSE 162 (H) 01/10/2023   NA 139 01/10/2023   K 3.8 01/10/2023   CL 106 01/10/2023   CO2 21 (L) 01/10/2023   BUN 16 01/10/2023   CREATININE 0.88 01/10/2023   GFRNONAA >60 01/10/2023   CALCIUM 8.6 (L) 01/10/2023   PROT 7.6 08/06/2022   ALBUMIN 4.6 08/06/2022   LABGLOB 3.0 08/06/2022    AGRATIO 1.5 08/06/2022   BILITOT 0.4 08/06/2022   ALKPHOS 90 08/06/2022   AST 24 08/06/2022   ALT 30 08/06/2022   ANIONGAP 12 01/10/2023   Last lipids Lab Results  Component Value Date   CHOL 148 09/03/2022   HDL 37 (L) 09/03/2022   LDLCALC 76 09/03/2022   TRIG 208 (H) 09/03/2022   CHOLHDL 4.0 09/03/2022   Last hemoglobin A1c Lab Results  Component Value Date   HGBA1C 7.9 (A) 02/04/2023   Last thyroid functions Lab Results  Component Value Date   TSH 1.770 08/27/2021   T4TOTAL 9.1 03/06/2020   Last vitamin D No results found for: "25OHVITD2", "25OHVITD3", "VD25OH" Last vitamin B12 and Folate No results found for: "VITAMINB12", "FOLATE"    The ASCVD Risk score (Arnett DK, et al., 2019) failed to calculate for the following reasons:   The patient has a prior MI or stroke diagnosis    Assessment & Plan:   1. Diabetes mellitus without complication (HCC) His diabetes is improving, though his HgbA1c was 7.9%, his goal should be less than 7%. He will continue on current medication, check his blood glucose tid, record and bring log to follow up appointment. His fasting blood glucose reading should be between 80-130 mg/dl. He was encouraged to continue on low carb/non concentrated sweet diet and exercise as tolerated.  - POCT Glucose (CBG) - POCT HgB A1C  2. Essential hypertension His blood pressure is under control, he will continue on current medication  and was encouraged to continue on DASH diet  and exercise as tolerated - amLODipine (NORVASC) 5 MG tablet; Take 1 tablet (5 mg total) by mouth daily.  - lisinopril (ZESTRIL) 20 MG tablet; TAKE ONE TABLET BY MOUTH ONCE EVERY DAY   3. Hyperlipidemia associated with type 2 diabetes mellitus (HCC) He will continue on current medication, was encouraged to continue on low fat/heart healthy diet, exercise as tolerated. - atorvastatin (LIPITOR) 40 MG tablet; Take 1 tablet (40 mg total) by mouth daily.    4. Hospital discharge  follow-up He was advised to follow up hospital discharge instructions and follow up - Ambulatory referral to Cardiology  5. Skin lesion of scalp Unknown etiology of skin lesion, tender with touch, no erythema or swelling. He was advised to continue applying Neosporin to site and go to the ED for worsening symptoms. He will follow up with Dermatology- Ambulatory  referral to Dermatology    Return in about 13 weeks (around 05/06/2023).    Chioma Trellis Paganini, NP                +

## 2023-02-11 ENCOUNTER — Other Ambulatory Visit: Payer: Self-pay

## 2023-02-15 ENCOUNTER — Other Ambulatory Visit: Payer: Self-pay

## 2023-02-15 ENCOUNTER — Other Ambulatory Visit: Payer: Self-pay | Admitting: Gerontology

## 2023-02-15 DIAGNOSIS — E119 Type 2 diabetes mellitus without complications: Secondary | ICD-10-CM

## 2023-02-16 ENCOUNTER — Other Ambulatory Visit: Payer: Self-pay

## 2023-02-16 MED ORDER — INSULIN LISPRO (1 UNIT DIAL) 100 UNIT/ML (KWIKPEN)
2.0000 [IU] | PEN_INJECTOR | Freq: Three times a day (TID) | SUBCUTANEOUS | 1 refills | Status: DC
Start: 2023-02-16 — End: 2023-07-07
  Filled 2023-02-16: qty 15, 50d supply, fill #0

## 2023-02-23 ENCOUNTER — Other Ambulatory Visit: Payer: Self-pay

## 2023-02-24 ENCOUNTER — Other Ambulatory Visit: Payer: Self-pay

## 2023-02-24 MED ORDER — TRUE METRIX METER W/DEVICE KIT
PACK | 0 refills | Status: AC
  Filled 2023-02-24: qty 1, 1d supply, fill #0

## 2023-02-24 MED ORDER — TRUE METRIX BLOOD GLUCOSE TEST VI STRP
ORAL_STRIP | 0 refills | Status: DC
  Filled 2023-02-24: qty 50, 25d supply, fill #0

## 2023-02-24 MED ORDER — TRUEPLUS LANCETS 33G MISC
0 refills | Status: AC
  Filled 2023-02-24: qty 100, 25d supply, fill #0

## 2023-03-22 ENCOUNTER — Other Ambulatory Visit: Payer: Self-pay

## 2023-04-07 ENCOUNTER — Other Ambulatory Visit: Payer: Self-pay

## 2023-04-13 ENCOUNTER — Other Ambulatory Visit: Payer: Self-pay

## 2023-04-15 ENCOUNTER — Ambulatory Visit: Payer: Medicaid Other | Admitting: Cardiology

## 2023-04-15 ENCOUNTER — Other Ambulatory Visit: Payer: Self-pay

## 2023-04-15 MED ORDER — DAPAGLIFLOZIN PROPANEDIOL 5 MG PO TABS
5.0000 mg | ORAL_TABLET | Freq: Every day | ORAL | 3 refills | Status: AC
  Filled 2023-08-13: qty 30, 30d supply, fill #0
  Filled 2023-09-08: qty 90, 90d supply, fill #0
  Filled 2023-09-08: qty 180, 180d supply, fill #0
  Filled 2024-03-17: qty 90, 90d supply, fill #1
  Filled 2024-03-17: qty 180, 180d supply, fill #1

## 2023-04-15 NOTE — Progress Notes (Deleted)
Cardiology Office Note:  .   Date:  04/15/2023  ID:  [name], DOB 08/10/1961, MRN [id number] PCP: Patient, No Pcp Per  [facility] Providers Cardiologist:  None { Click to update primary MD,subspecialty MD or APP then REFRESH:1}    No chief complaint on file.   History of Present Illness: .     ELOI EASTES is a 62 y.o. male with a PMH notable for HTN, HLD, DM-2 (currently on insulin (glargine plus meal coverage and metformin plus dapagliflozin/Farxiga), GERD and?  CVA who presents here for Chest Pain Evaluation at the request of Iloabachie, Chioma E, NP.  ORLANDER BIRDWELL was last seen @ [facility] ER on 5/19-20/2024 - presented with Chest pain that awoke him from sleep. Sharp "pressure" over L chest, non-radiating. Worse with walking & exertion. NTG x 1 - no benefit. EMS x 2 by EMS - improved it some - resolved upon arrival to ER. Plan was ST in AM  - but did not want to stay until the next day for ST. Troponin Negative & EKG non-ischemic.  D/c home with OP f/u.  He was seen on 02/04/2023 by Rolm Gala, NP for ER follow-up.  At that time he had not yet had his cardiology visit.  Denied any chest pain palpitations or lightheadedness.  A1c was down to 7.9 from 8.3.  No further chest pain.  No med changes made.  Referred to cardiology.   Subjective   INTERVAL HISTORY   ROS:  Cardiovascular ROS: {roscv:310661} Review of Systems - {ros master:310782}  Past Medical History:  Diagnosis Date   Diabetes mellitus without complication (HCC)    Edentulous    GERD (gastroesophageal reflux disease)    Hyperlipidemia    Hypertension    Stroke [facility])    No deficits   Past Surgical History:  Procedure Laterality Date   CARDIAC CATHETERIZATION  2013   No outpatient medications have been marked as taking for the 04/15/23 encounter (Appointment) with Marykay Lex, MD.  Per most recent clinic note: Amlodipine 5 mg daily, lisinopril 20 mg daily, Lipitor 40 mg daily,  metformin 1000 mg twice daily, Farxiga 5 mg daily, fluticasone-centimeter ALT 100-50 mcg/ACT twice daily INH, insulin/glargine 16 units nightly, insulin/lispro meal coverage, pantoprazole 20 mg daily, NTG as needed.   Social & Family History:  reports that he has been smoking cigarettes. He has a 5 pack-year smoking history. He has never used smokeless tobacco. He reports that he does not drink alcohol and does not use drugs. family history includes Diabetes in his father; Hypertension in his father and mother.     Objective   Studies Reviewed: .       ETT/GXT 03/04/2017: Exercised for 7 minutes.  Peak HR 150 bpm.  90% MPHR.  8.4 METS.  No EKG changes of ischemia.  LOW RISK. Myoview 04/2015: EF 55 to 65%.  LOW RISK.  No ischemia or infarction Echo 04/2015: EF 5055%.  Mild concentric LVH.  No RWMA.  Mild MR.  Otherwise normal.  Risk Assessment/Calculations:     No BP recorded.  {Refresh Note OR Click here to enter BP  :1}***         Physical Exam:   VS:  There were no vitals taken for this visit.   Wt Readings from Last 3 Encounters:  02/04/23 217 lb (98.4 kg)  01/10/23 215 lb (97.5 kg)  12/29/22 218 lb 1.6 oz (98.9 kg)    GEN: Well nourished, well  developed in no acute distress; *** NECK: No JVD; No carotid bruits CARDIAC: Normal S1, S2; RRR, no murmurs, rubs, gallops RESPIRATORY:  Clear to auscultation without rales, wheezing or rhonchi ; nonlabored, good air movement. ABDOMEN: Soft, non-tender, non-distended EXTREMITIES:  No edema; No deformity      ASSESSMENT AND PLAN: .    Problem List Items Addressed This Visit       Cardiology Problems   Hyperlipidemia (Chronic)     Other   Chest pain - Primary        {Are you ordering a CV Procedure (e.g. stress test, cath, DCCV, TEE, etc)?   Press F2        :[id number]}   Dispo: No follow-ups on file.  Total time spent: *** min spent with patient + *** min spent charting = *** min  Signed, Marykay Lex, MD, MS Bryan Lemma, M.D., M.S. Interventional Cardiologist  [facility]  Pager # [phone] Phone # [phone] [address] [place], [place] [address]

## 2023-04-16 ENCOUNTER — Other Ambulatory Visit: Payer: Self-pay

## 2023-04-20 ENCOUNTER — Other Ambulatory Visit: Payer: Self-pay

## 2023-04-20 ENCOUNTER — Other Ambulatory Visit: Payer: Self-pay | Admitting: Gerontology

## 2023-04-21 ENCOUNTER — Ambulatory Visit: Payer: Medicaid Other | Admitting: Gerontology

## 2023-04-21 ENCOUNTER — Other Ambulatory Visit: Payer: Self-pay

## 2023-04-21 ENCOUNTER — Other Ambulatory Visit: Payer: Self-pay | Admitting: Gerontology

## 2023-04-21 ENCOUNTER — Encounter: Payer: Self-pay | Admitting: Gerontology

## 2023-04-21 VITALS — BP 125/87 | HR 89 | Ht 70.0 in | Wt 215.6 lb

## 2023-04-21 DIAGNOSIS — I10 Essential (primary) hypertension: Secondary | ICD-10-CM

## 2023-04-21 DIAGNOSIS — E785 Hyperlipidemia, unspecified: Secondary | ICD-10-CM

## 2023-04-21 DIAGNOSIS — E119 Type 2 diabetes mellitus without complications: Secondary | ICD-10-CM

## 2023-04-21 MED ORDER — AMLODIPINE BESYLATE 5 MG PO TABS
5.0000 mg | ORAL_TABLET | Freq: Every day | ORAL | 2 refills | Status: DC
Start: 2023-04-21 — End: 2023-09-07
  Filled 2023-04-21 – 2023-06-09 (×2): qty 30, 30d supply, fill #0
  Filled 2023-07-07: qty 30, 30d supply, fill #1
  Filled 2023-08-13: qty 30, 30d supply, fill #2

## 2023-04-21 MED ORDER — ATORVASTATIN CALCIUM 40 MG PO TABS
40.0000 mg | ORAL_TABLET | Freq: Every day | ORAL | 2 refills | Status: DC
  Filled 2023-04-21 – 2023-06-09 (×2): qty 30, 30d supply, fill #0
  Filled 2023-07-07: qty 30, 30d supply, fill #1
  Filled 2023-08-13: qty 30, 30d supply, fill #2

## 2023-04-21 MED ORDER — TRUE METRIX BLOOD GLUCOSE TEST VI STRP
ORAL_STRIP | 3 refills | Status: DC
Start: 2023-04-21 — End: 2023-07-09
  Filled 2023-04-21: qty 50, 16d supply, fill #0
  Filled 2023-06-23: qty 100, 33d supply, fill #1

## 2023-04-21 NOTE — Patient Instructions (Signed)

## 2023-04-21 NOTE — Progress Notes (Signed)
Established Patient Office Visit  Subjective   Patient ID: Roy Norton, male    DOB: 05/30/1961  Age: 62 y.o. MRN: [id number]  Chief Complaint  Patient presents with   Follow-up    HPI  Roy Norton is a 61 year old male who has history of type 2 diabetes, GERD, hyperlipidemia, hypertension, stroke and presents for follow up visit and medication refill.   He states that he's compliant with his medications, denies side effects and continues to make healthy lifestyle changes , His HgbA1c checked on 02/04/23 was 7.9%, he brought his blood glucose log and his fasting readings ranges between 126 to 200 mg/dl, noon reading ranges between 120 to 170  mg/dl and evening readings are between 130 to 210 mg/dl. He states that his fasting reading this morning was 172 mg/dl, did not have breakfast and his reading was 119 mg/dl during visit. He denies hypoglycemic symptoms, endorses hyperglycemic symptoms and peripheral neuropathy. Overall, he states that he's doing well and offers no further complaint. He has active Medicaid and was advised to call and schedule new patient appointment.   Patient Active Problem List   Diagnosis Date Noted   Skin lesion of scalp 02/04/2023   Chest pain 01/10/2023   Knee pain, right 11/05/2022   Caregiver burden 02/10/2022   Headache 02/05/2022   Perineal lump 11/28/2020   History of shortness of breath 03/07/2020   History of insomnia 03/07/2020   Migraine 12/12/2015   History of tobacco abuse 04/30/2015   Hyperlipidemia 04/30/2015   Essential hypertension 04/30/2015   Diabetes mellitus without complication (HCC) 04/30/2015   Obesity (BMI 30-39.9) 04/30/2015   Chest pain at rest 04/28/2015   Dizziness 04/11/2015   Past Medical History:  Diagnosis Date   Diabetes mellitus without complication (HCC)    Edentulous    GERD (gastroesophageal reflux disease)    Hyperlipidemia    Hypertension    Stroke (HCC)    No deficits   Past Surgical History:   Procedure Laterality Date   CARDIAC CATHETERIZATION  2013   Social History   Tobacco Use   Smoking status: Some Days    Current packs/day: 0.50    Average packs/day: 0.5 packs/day for 10.0 years (5.0 ttl pk-yrs)    Types: Cigarettes   Smokeless tobacco: Never   Tobacco comments:    1-2 cigarettes daily  Vaping Use   Vaping status: Never Used  Substance Use Topics   Alcohol use: No    Alcohol/week: 0.0 standard drinks of alcohol   Drug use: No   Family History  Problem Relation Age of Onset   Hypertension Mother    Hypertension Father    Diabetes Father    No Known Allergies    Review of Systems  Constitutional: Negative.   Eyes: Negative.   Respiratory: Negative.    Cardiovascular: Negative.   Gastrointestinal: Negative.   Genitourinary:  Negative for frequency.  Skin: Negative.   Neurological: Negative.  Negative for headaches.  Endo/Heme/Allergies:  Positive for polydipsia.  Psychiatric/Behavioral: Negative.        Objective:     BP 125/87   Pulse 89   Ht 5\' 10"  (1.778 m)   Wt 215 lb 9.6 oz (97.8 kg)   SpO2 94%   BMI 30.94 kg/m  BP Readings from Last 3 Encounters:  04/21/23 125/87  02/04/23 113/82  01/11/23 (!) 137/94   Wt Readings from Last 3 Encounters:  04/21/23 215 lb 9.6 oz (97.8 kg)  02/04/23 217 lb (98.4  kg)  01/10/23 215 lb (97.5 kg)      Physical Exam HENT:     Head: Normocephalic and atraumatic.     Mouth/Throat:     Mouth: Mucous membranes are moist.  Eyes:     Pupils: Pupils are equal, round, and reactive to light.  Cardiovascular:     Rate and Rhythm: Normal rate and regular rhythm.     Pulses: Normal pulses.     Heart sounds: Normal heart sounds.  Pulmonary:     Effort: Pulmonary effort is normal.     Breath sounds: Normal breath sounds.  Skin:    General: Skin is warm and dry.  Neurological:     General: No focal deficit present.     Mental Status: He is alert and oriented to person, place, and time.  Psychiatric:         Mood and Affect: Mood normal.        Behavior: Behavior normal.        Thought Content: Thought content normal.        Judgment: Judgment normal.      No results found for any visits on 04/21/23.  Last CBC Lab Results  Component Value Date   WBC 7.4 01/10/2023   HGB 15.6 01/10/2023   HCT 47.8 01/10/2023   MCV 91.4 01/10/2023   MCH 29.8 01/10/2023   RDW 14.0 01/10/2023   PLT 210 01/10/2023   Last metabolic panel Lab Results  Component Value Date   GLUCOSE 162 (H) 01/10/2023   NA 139 01/10/2023   K 3.8 01/10/2023   CL 106 01/10/2023   CO2 21 (L) 01/10/2023   BUN 16 01/10/2023   CREATININE 0.88 01/10/2023   GFRNONAA >60 01/10/2023   CALCIUM 8.6 (L) 01/10/2023   PROT 7.6 08/06/2022   ALBUMIN 4.6 08/06/2022   LABGLOB 3.0 08/06/2022   AGRATIO 1.5 08/06/2022   BILITOT 0.4 08/06/2022   ALKPHOS 90 08/06/2022   AST 24 08/06/2022   ALT 30 08/06/2022   ANIONGAP 12 01/10/2023   Last lipids Lab Results  Component Value Date   CHOL 148 09/03/2022   HDL 37 (L) 09/03/2022   LDLCALC 76 09/03/2022   TRIG 208 (H) 09/03/2022   CHOLHDL 4.0 09/03/2022   Last hemoglobin A1c Lab Results  Component Value Date   HGBA1C 7.9 (A) 02/04/2023   Last thyroid functions Lab Results  Component Value Date   TSH 1.770 08/27/2021   T4TOTAL 9.1 03/06/2020   Last vitamin D No results found for: "25OHVITD2", "25OHVITD3", "VD25OH" Last vitamin B12 and Folate No results found for: "VITAMINB12", "FOLATE"    The ASCVD Risk score (Arnett DK, et al., 2019) failed to calculate for the following reasons:   The patient has a prior MI or stroke diagnosis    Assessment & Plan:   1. Hyperlipidemia associated with type 2 diabetes mellitus (HCC) - He will continue current medication,low fat/cholesterol diet and exercise as tolerated. - atorvastatin (LIPITOR) 40 MG tablet; Take 1 tablet (40 mg total) by mouth daily.  Dispense: 30 tablet; Refill: 2  2. Essential hypertension - His blood  pressure is improving, he will continue current medication, DASH diet and exercise as tolerated. - amLODipine (NORVASC) 5 MG tablet; Take 1 tablet (5 mg total) by mouth daily.  Dispense: 30 tablet; Refill: 2  3. Diabetes mellitus without complication (HCC) - He was encouraged to continue checking his blood glucose, record and bring log to follow up appointment. He was encouraged to continue on  low carb/non concentrated sweet diet and exercise as tolerated.  - glucose blood (TRUE METRIX BLOOD GLUCOSE TEST) test strip; Use to check blood glucose 3 times daily.  Dispense: 100 each; Refill: 3   Return in about 27 days (around 05/18/2023), or if symptoms worsen or fail to improve.    Tome Wilson Trellis Paganini, NP

## 2023-04-22 ENCOUNTER — Other Ambulatory Visit: Payer: Self-pay

## 2023-04-23 ENCOUNTER — Other Ambulatory Visit: Payer: Self-pay

## 2023-04-27 ENCOUNTER — Other Ambulatory Visit: Payer: Self-pay

## 2023-04-28 ENCOUNTER — Other Ambulatory Visit: Payer: Self-pay

## 2023-05-06 ENCOUNTER — Ambulatory Visit: Payer: Self-pay | Admitting: Gerontology

## 2023-06-04 ENCOUNTER — Other Ambulatory Visit: Payer: Self-pay

## 2023-06-09 ENCOUNTER — Other Ambulatory Visit: Payer: Self-pay

## 2023-06-11 ENCOUNTER — Other Ambulatory Visit: Payer: Self-pay

## 2023-06-18 ENCOUNTER — Other Ambulatory Visit: Payer: Self-pay

## 2023-06-23 ENCOUNTER — Ambulatory Visit: Payer: Medicaid Other | Admitting: Physician Assistant

## 2023-06-23 ENCOUNTER — Encounter: Payer: Self-pay | Admitting: Physician Assistant

## 2023-06-23 ENCOUNTER — Other Ambulatory Visit: Payer: Self-pay

## 2023-06-23 ENCOUNTER — Other Ambulatory Visit: Payer: Self-pay | Admitting: Gerontology

## 2023-06-23 VITALS — BP 143/88 | HR 83 | Temp 98.2°F | Resp 20 | Ht 70.0 in | Wt 212.7 lb

## 2023-06-23 DIAGNOSIS — E119 Type 2 diabetes mellitus without complications: Secondary | ICD-10-CM

## 2023-06-23 DIAGNOSIS — R0989 Other specified symptoms and signs involving the circulatory and respiratory systems: Secondary | ICD-10-CM

## 2023-06-23 DIAGNOSIS — E782 Mixed hyperlipidemia: Secondary | ICD-10-CM

## 2023-06-23 DIAGNOSIS — I10 Essential (primary) hypertension: Secondary | ICD-10-CM | POA: Diagnosis not present

## 2023-06-23 DIAGNOSIS — Z1211 Encounter for screening for malignant neoplasm of colon: Secondary | ICD-10-CM

## 2023-06-23 DIAGNOSIS — J440 Chronic obstructive pulmonary disease with acute lower respiratory infection: Secondary | ICD-10-CM | POA: Diagnosis not present

## 2023-06-23 DIAGNOSIS — E66811 Obesity, class 1: Secondary | ICD-10-CM

## 2023-06-23 DIAGNOSIS — Z125 Encounter for screening for malignant neoplasm of prostate: Secondary | ICD-10-CM

## 2023-06-23 DIAGNOSIS — J209 Acute bronchitis, unspecified: Secondary | ICD-10-CM

## 2023-06-23 DIAGNOSIS — Z23 Encounter for immunization: Secondary | ICD-10-CM | POA: Diagnosis not present

## 2023-06-23 DIAGNOSIS — K219 Gastro-esophageal reflux disease without esophagitis: Secondary | ICD-10-CM

## 2023-06-23 DIAGNOSIS — J449 Chronic obstructive pulmonary disease, unspecified: Secondary | ICD-10-CM

## 2023-06-23 DIAGNOSIS — Z7689 Persons encountering health services in other specified circumstances: Secondary | ICD-10-CM

## 2023-06-23 DIAGNOSIS — Z794 Long term (current) use of insulin: Secondary | ICD-10-CM

## 2023-06-23 DIAGNOSIS — Z114 Encounter for screening for human immunodeficiency virus [HIV]: Secondary | ICD-10-CM

## 2023-06-23 NOTE — Progress Notes (Signed)
New patient visit  Patient: Roy Norton   DOB: 1960-08-26   62 y.o. Male  MRN: [id number] Visit Date: 06/23/2023  Today's healthcare provider: Debera Lat, PA-C   Chief Complaint  Patient presents with   Establish Care   Subjective    Roy Norton is a 62 y.o. male who presents today as a new patient to establish care.   Discussed the use of AI scribe software for clinical note transcription with the patient, who gave verbal consent to proceed.  History of Present Illness   The patient, with a history of hypertension, diabetes, hyperlipidemia, COPD, and a history of two strokes, presents for a general checkup. He reports taking daily medication for hypertension, but is unsure of the specific medication. Despite this, he believes his blood pressure is well controlled. He denies any current depressive symptoms and is not interested in starting any medication for depression.  The patient has been managing his diabetes with metformin and two types of insulin, one of which is taken as needed. He reports that his blood sugar levels have been stable recently, with a reading of 115 that morning. He also takes Lipitor for his high cholesterol and Farxiga for his diabetes.  He has a history of COPD and is currently smoking a pack of cigarettes daily, having started smoking five to six years ago. He has tried to quit but found it too difficult. He is currently taking Vaxelis and ProAir for his COPD.  The patient also reports a history of two strokes, the most recent of which occurred ten years ago. He reports no residual deficits from these events and is currently on blood thinners. He also takes medication for acid reflux and denies any recent headaches.  He reports a recent fall a couple of weeks ago, but denies any loss of consciousness or vision problems. He has a lazy eye and is scheduled for surgery. He also reports a mild heart attack three weeks ago, but has had no further incidents  since.        Past Medical History:  Diagnosis Date   Asthma    Diabetes mellitus without complication (HCC)    Edentulous    GERD (gastroesophageal reflux disease)    Glaucoma    Heart murmur    Hyperlipidemia    Hypertension    Stroke (HCC)    No deficits   Past Surgical History:  Procedure Laterality Date   CARDIAC CATHETERIZATION  2013   Family Status  Relation Name Status   Mother  Alive   Father  Deceased   MGM  (Not Specified)   MGF  (Not Specified)   PGF  (Not Specified)  No partnership data on file   Family History  Problem Relation Age of Onset   Hypertension Mother    Hypertension Father    Diabetes Father    Heart Problems Maternal Grandmother    Emphysema Maternal Grandfather    Emphysema Paternal Grandfather    Social History   Socioeconomic History   Marital status: Single    Spouse name: Not on file   Number of children: Not on file   Years of education: Not on file   Highest education level: 11th grade  Occupational History   Occupation: none  Tobacco Use   Smoking status: Every Day    Current packs/day: 0.50    Average packs/day: 0.5 packs/day for 10.0 years (5.0 ttl pk-yrs)    Types: Cigarettes   Smokeless tobacco: Never  Tobacco comments:    1-2 cigarettes daily  Vaping Use   Vaping status: Never Used  Substance and Sexual Activity   Alcohol use: No    Alcohol/week: 0.0 standard drinks of alcohol   Drug use: No   Sexual activity: Yes  Other Topics Concern   Not on file  Social History Narrative   Rents, 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   Social Determinants of Health   Financial Resource Strain: Low Risk  (11/05/2022)   Overall Financial Resource Strain (CARDIA)    Difficulty of Paying Living Expenses: Not hard at all  Food Insecurity: No Food Insecurity (11/05/2022)   Hunger Vital Sign    Worried About Running Out of Food in the Last Year: Never true    Ran Out of Food in the Last Year: Never  true  Transportation Needs: No Transportation Needs (11/05/2022)   PRAPARE - Administrator, Civil Service (Medical): No    Lack of Transportation (Non-Medical): No  Physical Activity: Sufficiently Active (11/05/2022)   Exercise Vital Sign    Days of Exercise per Week: 2 days    Minutes of Exercise per Session: 120 min  Stress: No Stress Concern Present (11/05/2022)   [employer] of Occupational Health - Occupational Stress Questionnaire    Feeling of Stress : Not at all  Social Connections: Socially Isolated (11/05/2022)   Social Connection and Isolation Panel [NHANES]    Frequency of Communication with Friends and Family: Never    Frequency of Social Gatherings with Friends and Family: Never    Attends Religious Services: More than 4 times per year    Active Member of [employer] or Organizations: No    Attends Banker Meetings: Never    Marital Status: Never married   Outpatient Medications Prior to Visit  Medication Sig   amLODipine (NORVASC) 5 MG tablet Take 1 tablet (5 mg total) by mouth daily.   aspirin 81 MG chewable tablet Chew 1 tablet (81 mg total) by mouth daily.   atorvastatin (LIPITOR) 40 MG tablet Take 1 tablet (40 mg total) by mouth daily.   Blood Glucose Monitoring Suppl (TRUE METRIX METER) w/Device KIT use to check blood glucose   dapagliflozin propanediol (FARXIGA) 5 MG TABS tablet Take 1 tablet (5 mg total) by mouth daily.   fluticasone-salmeterol (WIXELA INHUB) 100-50 MCG/ACT AEPB Inhale 1 puff into the lungs 2 (two) times daily.   glucose blood (RIGHTEST GS550 BLOOD GLUCOSE) test strip test up to 4 times a day   glucose blood (TRUE METRIX BLOOD GLUCOSE TEST) test strip Use to check blood glucose 3 times daily.   Insulin Glargine (BASAGLAR KWIKPEN) 100 UNIT/ML Inject 16 Units into the skin at bedtime.   insulin lispro (HUMALOG KWIKPEN) 100 UNIT/ML KwikPen Inject 0.02 mLs (2 Units total) into the skin 3 (three) times daily with meals. 0-149 = 0  units 150-199= 2 units  200-249= 4 units 250-299 = 6 units 300-349= 8 units 350-399= 10 units 400-449= 12 units and please call clinic to report sugar and insulin taken   Insulin Pen Needle (UNIFINE PENTIPS) 31G X 5 MM MISC 1 Needle at bedtime.   lisinopril (ZESTRIL) 20 MG tablet TAKE ONE TABLET BY MOUTH ONCE EVERY DAY   metFORMIN (GLUCOPHAGE) 500 MG tablet Take 2 tablets (1,000 mg total) by mouth 2 (two) times daily with a meal.   nitroGLYCERIN (NITROSTAT) 0.4 MG SL tablet Dissolve 1 tablet (0.4 mg total) under the tongue  once every 5 (five) minutes as needed for chest pain. If no relief after first dose, call 911. (Max of 3 doses in 15 minutes).   pantoprazole (PROTONIX) 20 MG tablet Take 1 tablet (20 mg total) by mouth daily.   Rightest GL300 Lancets MISC test up to 4 times a day as directed   TRUEplus Lancets 33G MISC use to check blood sugar   PROAIR HFA 108 (90 Base) MCG/ACT inhaler INHALE 2 PUFFS BY MOUTH ONCE EVERY 4 HOURS AS NEEDED. REPLACES ALBUTEROL.   No facility-administered medications prior to visit.   No Known Allergies  Immunization History  Administered Date(s) Administered   Influenza, Seasonal, Injecte, Preservative Fre 06/23/2023   Influenza,inj,Quad PF,6+ Mos 04/29/2015   Moderna Sars-Covid-2 Vaccination 02/12/2020, 03/22/2020   Pneumococcal Polysaccharide-23 04/29/2015    Health Maintenance  Topic Date Due   HIV Screening  Never done   Hepatitis C Screening  Never done   DTaP/Tdap/Td (1 - Tdap) Never done   Zoster Vaccines- Shingrix (1 of 2) Never done   Colonoscopy  Never done   COVID-19 Vaccine (3 - Moderna risk series) 04/19/2020   FOOT EXAM  06/12/2023   HEMOGLOBIN A1C  08/06/2023   Diabetic kidney evaluation - Urine ACR  08/07/2023   OPHTHALMOLOGY EXAM  01/06/2024   Diabetic kidney evaluation - eGFR measurement  01/10/2024   INFLUENZA VACCINE  Completed   HPV VACCINES  Aged Out    Patient Care Team: Debera Lat, PA-C as PCP - General  (Physician Assistant)  Review of Systems  All other systems reviewed and are negative.  Except see HPI       Objective    BP (!) 143/88 (BP Location: Left Arm, Patient Position: Sitting, Cuff Size: Normal)   Pulse 83   Temp 98.2 F (36.8 C)   Resp 20   Ht 5\' 10"  (1.778 m)   Wt 212 lb 11.2 oz (96.5 kg)   SpO2 96%   BMI 30.52 kg/m     Physical Exam Vitals reviewed.  Constitutional:      General: He is not in acute distress.    Appearance: Normal appearance. He is not diaphoretic.  HENT:     Head: Normocephalic and atraumatic.  Eyes:     General: No scleral icterus.    Conjunctiva/sclera: Conjunctivae normal.  Cardiovascular:     Rate and Rhythm: Normal rate and regular rhythm.     Pulses: Normal pulses.     Heart sounds: Normal heart sounds. No murmur heard. Pulmonary:     Effort: Pulmonary effort is normal. No respiratory distress.     Breath sounds: Rhonchi present.  Musculoskeletal:     Cervical back: Neck supple.     Right lower leg: No edema.     Left lower leg: No edema.  Lymphadenopathy:     Cervical: No cervical adenopathy.  Skin:    General: Skin is warm and dry.     Findings: No rash.  Neurological:     Mental Status: He is alert and oriented to person, place, and time. Mental status is at baseline.  Psychiatric:        Mood and Affect: Mood normal.        Behavior: Behavior normal.     Depression Screen    06/23/2023    2:04 PM 11/05/2022   10:49 AM  PHQ 2/9 Scores  PHQ - 2 Score 3 0  PHQ- 9 Score 11    No results found for any visits on  06/23/23.  Assessment & Plan        1. Encounter for immunization - Flu vaccine trivalent PF, 6mos and older(Flulaval,Afluria,Fluarix,Fluzone)  2. Essential hypertension Chronic and unstable Controlled on Amlodipine 5mg   and Lisinopril 20mg  daily. No reported leg swelling. -Continue current medications. - Hemoglobin A1c - Lipid panel - TSH - Comprehensive metabolic panel - CBC with  Differential/Platelet - PSA Total (Reflex To Free) Will reassess after  receiving lab results Will Follow-up  3. Diabetes mellitus without complication (HCC) Chronic and stable On Metformin 2000mg , Farxiga 5 mg, and Insulin (16 units at bedtime and 3 units twice a day as needed). Recent blood glucose levels reported as stable. -Continue current medications. - Hemoglobin A1c - Lipid panel - TSH - Comprehensive metabolic panel - CBC with Differential/Platelet - PSA Total (Reflex To Free) Will Follow-up  4. Obesity (BMI 30.0-34.9) Chronic and BMI of 30+ - Hemoglobin A1c - Lipid panel - TSH - Comprehensive metabolic panel - CBC with Differential/Platelet - PSA Total (Reflex To Free) .will Follow-up  5. Mixed hyperlipidemia Chronic and previously stable On Lipitor 40mg  daily. -Continue current medication. - Hemoglobin A1c - Lipid panel - TSH - Comprehensive metabolic panel - CBC with Differential/Platelet - PSA Total (Reflex To Free) Will Follow-up  6. Gastroesophageal reflux disease without esophagitis Chronic, on PPI Advised lifestyle modifications -Continue current medication.  7. Abnormal lung sounds - DG Chest 2 View; Future 8. Acute bronchitis with COPD (HCC) Abnormal lung sounds in the setting of copd On wixela or ProAir.  -Order chest X-ray to assess for any changes related to COPD. -Refer to pulmonologist for further management. - Ambulatory referral to Pulmonology  Colon cancer screening -Cologuard  Encounter for screening for HIV -HIV antibodies  Prostate cancer screening -PSA  Flu shot/immunization Was completed  History of Stroke Two episodes approximately 10 years ago with no residual deficits. Currently on blood thinners. -Continue current anticoagulation therapy.  Tobacco Use Pack a day for approximately 5-6 years. Expressed difficulty with quitting. -Provide resources for smoking cessation.  Recent fall with facial swelling, but no  reported loss of consciousness or vision changes. Imaging declined  General Health Maintenance -Order comprehensive blood work including A1c, lipid panel, thyroid function tests, liver and kidney function tests, electrolytes, hemoglobin, and PSA. -Order Cologuard test for colon cancer screening. -Schedule follow-up appointment in approximately six weeks.      Encounter to establish care Welcomed to our clinic Reviewed past medical hx, social hx, family hx and surgical hx Pt advised to send all vaccination records or screening   Return in about 4 weeks (around 07/21/2023) for chronic disease f/u.    The patient was advised to call back or seek an in-person evaluation if the symptoms worsen or if the condition fails to improve as anticipated.  I discussed the assessment and treatment plan with the patient. The patient was provided an opportunity to ask questions and all were answered. The patient agreed with the plan and demonstrated an understanding of the instructions.  I, Debera Lat, PA-C have reviewed all documentation for this visit. The documentation on  06/23/23  for the exam, diagnosis, procedures, and orders are all accurate and complete.  Debera Lat, [facility], MMS [facility] [phone] (phone) [phone] (fax)  [facility]

## 2023-06-24 LAB — CBC WITH DIFFERENTIAL/PLATELET
Basophils Absolute: 0 10*3/uL (ref 0.0–0.2)
Basos: 0 %
EOS (ABSOLUTE): 0.1 10*3/uL (ref 0.0–0.4)
Eos: 1 %
Hematocrit: 48.1 % (ref 37.5–51.0)
Hemoglobin: 15.8 g/dL (ref 13.0–17.7)
Immature Grans (Abs): 0 10*3/uL (ref 0.0–0.1)
Immature Granulocytes: 0 %
Lymphocytes Absolute: 2.8 10*3/uL (ref 0.7–3.1)
Lymphs: 35 %
MCH: 29.9 pg (ref 26.6–33.0)
MCHC: 32.8 g/dL (ref 31.5–35.7)
MCV: 91 fL (ref 79–97)
Monocytes Absolute: 0.4 10*3/uL (ref 0.1–0.9)
Monocytes: 5 %
Neutrophils Absolute: 4.8 10*3/uL (ref 1.4–7.0)
Neutrophils: 59 %
Platelets: 229 10*3/uL (ref 150–450)
RBC: 5.28 x10E6/uL (ref 4.14–5.80)
RDW: 13 % (ref 11.6–15.4)
WBC: 8.2 10*3/uL (ref 3.4–10.8)

## 2023-06-24 LAB — COMPREHENSIVE METABOLIC PANEL
ALT: 28 [IU]/L (ref 0–44)
AST: 23 [IU]/L (ref 0–40)
Albumin: 4.9 g/dL (ref 3.9–4.9)
Alkaline Phosphatase: 95 [IU]/L (ref 44–121)
BUN/Creatinine Ratio: 16 (ref 10–24)
BUN: 14 mg/dL (ref 8–27)
Bilirubin Total: 0.4 mg/dL (ref 0.0–1.2)
CO2: 23 mmol/L (ref 20–29)
Calcium: 9.7 mg/dL (ref 8.6–10.2)
Chloride: 103 mmol/L (ref 96–106)
Creatinine, Ser: 0.89 mg/dL (ref 0.76–1.27)
Globulin, Total: 2.8 g/dL (ref 1.5–4.5)
Glucose: 130 mg/dL — ABNORMAL HIGH (ref 70–99)
Potassium: 4.2 mmol/L (ref 3.5–5.2)
Sodium: 141 mmol/L (ref 134–144)
Total Protein: 7.7 g/dL (ref 6.0–8.5)
eGFR: 97 mL/min/{1.73_m2} (ref >59)

## 2023-06-24 LAB — PSA TOTAL (REFLEX TO FREE): Prostate Specific Ag, Serum: 1.5 ng/mL (ref 0.0–4.0)

## 2023-06-24 LAB — TSH: TSH: 1.29 u[IU]/mL (ref 0.450–4.500)

## 2023-06-24 LAB — HEMOGLOBIN A1C
Est. average glucose Bld gHb Est-mCnc: 157 mg/dL
Hgb A1c MFr Bld: 7.1 % — ABNORMAL HIGH (ref 4.8–5.6)

## 2023-06-24 LAB — LIPID PANEL
Chol/HDL Ratio: 3.8 ratio (ref 0.0–5.0)
Cholesterol, Total: 139 mg/dL (ref 100–199)
HDL: 37 mg/dL — ABNORMAL LOW (ref >39)
LDL Chol Calc (NIH): 81 mg/dL (ref 0–99)
Triglycerides: 114 mg/dL (ref 0–149)
VLDL Cholesterol Cal: 21 mg/dL (ref 5–40)

## 2023-06-25 ENCOUNTER — Encounter: Payer: Self-pay | Admitting: Physician Assistant

## 2023-06-28 ENCOUNTER — Other Ambulatory Visit: Payer: Self-pay

## 2023-06-28 NOTE — Progress Notes (Signed)
All labs are normal except A1C of 7.1. Advised to continue with current DM regimen, low carb diet and regular exercise. Except low good cholesterol. Advised healthy diet and exercise for weight loss

## 2023-07-01 ENCOUNTER — Ambulatory Visit: Payer: Medicaid Other | Admitting: Cardiology

## 2023-07-01 NOTE — Progress Notes (Deleted)
  Cardiology Office Note:  .   Date:  07/01/2023  ID:  Roy Norton, DOB December 20, 1960, MRN [id number] PCP: Debera Lat, PA-C  [facility] Providers Cardiologist:  None { Click to update primary MD,subspecialty MD or APP then REFRESH:1}    No chief complaint on file.   Patient Profile: .     Roy Norton is a *** 62 y.o. male smoker with a PMH notable for HTN, HLD, DM-2, COPD/Chronic Bronchitis who presents here for *** at the request of [place], Chioma E, NP.    Roy Norton was seen in [facility] ED 01/10/23 -> awakened from sleep ~8 AM w/ sharp pressure over L chest. Worse with exertion. Took 1 NTG without benefit & 2 NTG sprays with improvement. Pain resolved upon arrival to ER.  R/o MI - d/c home with plans for OP ST.   He was last seen on 06/23/23 for COPD/Acute Bronchitis => referred to Pulmonology  Subjective  Discussed the use of AI scribe software for clinical note transcription with the patient, who gave verbal consent to proceed.  History of Present Illness          Cardiovascular ROS: {roscv:310661}  ROS:  Review of Systems - {ros master:310782}    Objective   Studies Reviewed: Marland Kitchen        ECHO 6//2018: *** Myoview 04/2015 : ***  Risk Assessment/Calculations:   {Does this patient have ATRIAL FIBRILLATION?:[phone]} No BP recorded.  {Refresh Note OR Click here to enter BP  :1}***         Physical Exam:   VS:  There were no vitals taken for this visit.   Wt Readings from Last 3 Encounters:  06/23/23 212 lb 11.2 oz (96.5 kg)  04/21/23 215 lb 9.6 oz (97.8 kg)  02/04/23 217 lb (98.4 kg)    GEN: Well nourished, well developed in no acute distress; *** NECK: No JVD; No carotid bruits CARDIAC: Normal S1, S2; RRR, no murmurs, rubs, gallops RESPIRATORY:  Clear to auscultation without rales, wheezing or rhonchi ; nonlabored, good air movement. ABDOMEN: Soft, non-tender, non-distended EXTREMITIES:  No edema; No deformity      ASSESSMENT AND  PLAN: .    Problem List Items Addressed This Visit   None    Assessment and Plan                 {Are you ordering a CV Procedure (e.g. stress test, cath, DCCV, TEE, etc)?   Press F2        :[id number]}   Follow-Up: No follow-ups on file.  Total time spent: *** min spent with patient + *** min spent charting = *** min      Signed, Marykay Lex, MD, MS Bryan Lemma, M.D., M.S. Interventional Cardiologist  [facility]  Pager # [phone] Phone # [phone] [address] [place], [place] [address]

## 2023-07-02 ENCOUNTER — Other Ambulatory Visit: Payer: Self-pay | Admitting: Gerontology

## 2023-07-02 ENCOUNTER — Other Ambulatory Visit: Payer: Self-pay

## 2023-07-02 ENCOUNTER — Encounter: Payer: Self-pay | Admitting: Pulmonary Disease

## 2023-07-02 ENCOUNTER — Other Ambulatory Visit: Payer: Self-pay | Admitting: Physician Assistant

## 2023-07-02 DIAGNOSIS — E119 Type 2 diabetes mellitus without complications: Secondary | ICD-10-CM

## 2023-07-02 NOTE — Telephone Encounter (Signed)
Requested medications are due for refill today.  yes  Requested medications are on the active medications list.  yes  Last refill. Varied   Future visit scheduled.   yes  Notes to clinic.  Rx's signed by another provider. Please review for refill.    Requested Prescriptions  Pending Prescriptions Disp Refills   Insulin Glargine (BASAGLAR KWIKPEN) 100 UNIT/ML 15 mL 5    Sig: Inject 16 Units into the skin at bedtime.     Endocrinology:  Diabetes - Insulins Passed - 07/02/2023  7:56 AM      Passed - HBA1C is between 0 and 7.9 and within 180 days    Hemoglobin A1C  Date Value Ref Range Status  08/01/2015 5.9  Final   Hgb A1c MFr Bld  Date Value Ref Range Status  06/23/2023 7.1 (H) 4.8 - 5.6 % Final    Comment:             Prediabetes: 5.7 - 6.4          Diabetes: >6.4          Glycemic control for adults with diabetes: <7.0          Passed - Valid encounter within last 6 months    Recent Outpatient Visits           1 week ago Acute bronchitis with COPD (HCC)   [place] [facility] [place], [place], PA-C       Future Appointments             In 2 weeks Debera Lat, PA-C [place] [facility]   In 3 months Herbie Baltimore, Piedad Climes, MD [facility] [place]   In 7 months Deirdre Evener, MD [place]             metFORMIN (GLUCOPHAGE) 500 MG tablet 120 tablet 1    Sig: Take 2 tablets (1,000 mg total) by mouth 2 (two) times daily with a meal.     Endocrinology:  Diabetes - Biguanides Failed - 07/02/2023  7:56 AM      Failed - B12 Level in normal range and within 720 days    No results found for: "VITAMINB12"       Passed - Cr in normal range and within 360 days    Creatinine  Date Value Ref Range Status  08/03/2014 0.98 0.60 - 1.30 mg/dL Final   Creatinine, Ser  Date Value Ref Range Status  06/23/2023 0.89 0.76 - 1.27 mg/dL Final         Passed - HBA1C is between 0 and 7.9 and within  180 days    Hemoglobin A1C  Date Value Ref Range Status  08/01/2015 5.9  Final   Hgb A1c MFr Bld  Date Value Ref Range Status  06/23/2023 7.1 (H) 4.8 - 5.6 % Final    Comment:             Prediabetes: 5.7 - 6.4          Diabetes: >6.4          Glycemic control for adults with diabetes: <7.0          Passed - eGFR in normal range and within 360 days    EGFR (African American)  Date Value Ref Range Status  08/03/2014 >60 >49mL/min Final  04/16/2014 >60  Final   GFR calc Af Amer  Date Value Ref Range Status  08/28/2020 106 >59 mL/min/1.73 Final  Comment:    **In accordance with recommendations from the NKF-ASN Task force,**   [facility] is in the process of updating its eGFR calculation to the   2021 CKD-EPI creatinine equation that estimates kidney function   without a race variable.    EGFR (Non-African Amer.)  Date Value Ref Range Status  08/03/2014 >60 >75mL/min Final    Comment:    eGFR values <11mL/min/1.73 m2 may be an indication of chronic kidney disease (CKD). Calculated eGFR, using the MRDR Study equation, is useful in  patients with stable renal function. The eGFR calculation will not be reliable in acutely ill patients when serum creatinine is changing rapidly. It is not useful in patients on dialysis. The eGFR calculation may not be applicable to patients at the low and high extremes of body sizes, pregnant women, and vegetarians.   04/16/2014 >60  Final    Comment:    eGFR values <25mL/min/1.73 m2 may be an indication of chronic kidney disease (CKD). Calculated eGFR is useful in patients with stable renal function. The eGFR calculation will not be reliable in acutely ill patients when serum creatinine is changing rapidly. It is not useful in  patients on dialysis. The eGFR calculation may not be applicable to patients at the low and high extremes of body sizes, pregnant women, and vegetarians.    GFR, Estimated  Date Value Ref Range Status   01/10/2023 >60 >60 mL/min Final    Comment:    (NOTE) Calculated using the CKD-EPI Creatinine Equation (2021)    eGFR  Date Value Ref Range Status  06/23/2023 97 >59 mL/min/1.73 Final         Passed - Valid encounter within last 6 months    Recent Outpatient Visits           1 week ago Acute bronchitis with COPD (HCC)   [facility] [place], [place], PA-C       Future Appointments             In 2 weeks Debera Lat, PA-C [facility]   In 3 months Herbie Baltimore, Piedad Climes, MD [facility] [place]   In 7 months Deirdre Evener, MD [place] [facility]            Passed - CBC within normal limits and completed in the last 12 months    WBC  Date Value Ref Range Status  06/23/2023 8.2 3.4 - 10.8 x10E3/uL Final  01/10/2023 7.4 4.0 - 10.5 K/uL Final   RBC  Date Value Ref Range Status  06/23/2023 5.28 4.14 - 5.80 x10E6/uL Final  01/10/2023 5.23 4.22 - 5.81 MIL/uL Final   Hemoglobin  Date Value Ref Range Status  06/23/2023 15.8 13.0 - 17.7 g/dL Final   Hematocrit  Date Value Ref Range Status  06/23/2023 48.1 37.5 - 51.0 % Final   MCHC  Date Value Ref Range Status  06/23/2023 32.8 31.5 - 35.7 g/dL Final  11/91/4782 95.6 30.0 - 36.0 g/dL Final   [facility]  Date Value Ref Range Status  06/23/2023 29.9 26.6 - 33.0 pg Final  01/10/2023 29.8 26.0 - 34.0 pg Final   MCV  Date Value Ref Range Status  06/23/2023 91 79 - 97 fL Final  08/03/2014 94 80 - 100 fL Final   No results found for: "PLTCOUNTKUC", "LABPLAT", "POCPLA" RDW  Date Value Ref Range Status  06/23/2023 13.0 11.6 - 15.4 % Final  08/03/2014 14.3 11.5 - 14.5 %  Final

## 2023-07-03 ENCOUNTER — Other Ambulatory Visit: Payer: Self-pay

## 2023-07-05 ENCOUNTER — Other Ambulatory Visit: Payer: Self-pay

## 2023-07-06 ENCOUNTER — Other Ambulatory Visit: Payer: Self-pay

## 2023-07-06 MED FILL — Metformin HCl Tab 500 MG: ORAL | 30 days supply | Qty: 120 | Fill #0 | Status: AC

## 2023-07-06 MED FILL — Insulin Glargine Soln Pen-Injector 100 Unit/ML: SUBCUTANEOUS | 75 days supply | Qty: 12 | Fill #0 | Status: AC

## 2023-07-07 ENCOUNTER — Other Ambulatory Visit: Payer: Self-pay | Admitting: Physician Assistant

## 2023-07-07 ENCOUNTER — Other Ambulatory Visit: Payer: Self-pay

## 2023-07-07 DIAGNOSIS — K219 Gastro-esophageal reflux disease without esophagitis: Secondary | ICD-10-CM

## 2023-07-07 DIAGNOSIS — I10 Essential (primary) hypertension: Secondary | ICD-10-CM

## 2023-07-07 DIAGNOSIS — E119 Type 2 diabetes mellitus without complications: Secondary | ICD-10-CM

## 2023-07-07 DIAGNOSIS — E1169 Type 2 diabetes mellitus with other specified complication: Secondary | ICD-10-CM

## 2023-07-08 ENCOUNTER — Other Ambulatory Visit: Payer: Self-pay

## 2023-07-08 ENCOUNTER — Telehealth: Payer: Self-pay | Admitting: Physician Assistant

## 2023-07-08 ENCOUNTER — Other Ambulatory Visit: Payer: Self-pay | Admitting: Physician Assistant

## 2023-07-08 DIAGNOSIS — K219 Gastro-esophageal reflux disease without esophagitis: Secondary | ICD-10-CM

## 2023-07-08 DIAGNOSIS — E1169 Type 2 diabetes mellitus with other specified complication: Secondary | ICD-10-CM

## 2023-07-08 DIAGNOSIS — E119 Type 2 diabetes mellitus without complications: Secondary | ICD-10-CM

## 2023-07-08 DIAGNOSIS — I10 Essential (primary) hypertension: Secondary | ICD-10-CM

## 2023-07-08 MED ORDER — INSULIN LISPRO (1 UNIT DIAL) 100 UNIT/ML (KWIKPEN)
2.0000 [IU] | PEN_INJECTOR | Freq: Three times a day (TID) | SUBCUTANEOUS | 1 refills | Status: AC
  Filled 2023-07-08: qty 15, 42d supply, fill #0

## 2023-07-08 NOTE — Telephone Encounter (Signed)
Requested medication (s) are due for refill today:yes  Requested medication (s) are on the active medication list: yes  Last refill:  02/16/23 15 ml 1 RF  Future visit scheduled: yes  Notes to clinic:  last refilled by another provider ispense: 15 mL     Refills: 1     Start: 02/16/2023      By: Rolm Gala, NP      Requested Prescriptions  Pending Prescriptions Disp Refills   insulin lispro (HUMALOG KWIKPEN) 100 UNIT/ML KwikPen 15 mL 1    Sig: Inject 0.02 mLs (2 Units total) into the skin 3 (three) times daily with meals. 0-149 = 0 units 150-199= 2 units  200-249= 4 units 250-299 = 6 units 300-349= 8 units 350-399= 10 units 400-449= 12 units and please call clinic to report sugar and insulin taken     There is no refill protocol information for this order

## 2023-07-08 NOTE — Telephone Encounter (Signed)
All meds listed were last ordered by Ludger Nutting NP at [facility] [place]: Amlodipine 04/21/23 #30 2 RF Atorvastatin 04/21/23 #30 2 RF Fluticasone 07/15/22 #180 3 RF Glucose test strips 04/21/23 100 each 3 RF Lancets 07/23/22 100 3 RF Pantoprazole: 01/19/23 #30 2 RF Lisinopril 02/04/23 #90 3 RF  Requested Prescriptions  Pending Prescriptions Disp Refills   amLODipine (NORVASC) 5 MG tablet 30 tablet 2    Sig: Take 1 tablet (5 mg total) by mouth daily.     Cardiovascular: Calcium Channel Blockers 2 Failed - 07/08/2023 10:18 AM      Failed - Last BP in normal range    BP Readings from Last 1 Encounters:  06/23/23 (!) 143/88         Passed - Last Heart Rate in normal range    Pulse Readings from Last 1 Encounters:  06/23/23 83         Passed - Valid encounter within last 6 months    Recent Outpatient Visits           2 weeks ago Acute bronchitis with COPD [facility])   [facility] [place], [place], PA-C       Future Appointments             In 1 week Debera Lat, PA-C [facility], [facility]   In 2 months Marykay Lex, MD [place] [facility] [place]   In 7 months Deirdre Evener, MD [facility]             atorvastatin (LIPITOR) 40 MG tablet 30 tablet 2    Sig: Take 1 tablet (40 mg total) by mouth daily.     Cardiovascular:  Antilipid - Statins Failed - 07/08/2023 10:18 AM      Failed - Lipid Panel in normal range within the last 12 months    Cholesterol, Total  Date Value Ref Range Status  06/23/2023 139 100 - 199 mg/dL Final   Cholesterol  Date Value Ref Range Status  04/16/2014 161 0 - 200 mg/dL Final   Ldl Cholesterol, Calc  Date Value Ref Range Status  04/16/2014 SEE COMMENT 0 - 100 mg/dL Final    Comment:    LDL/VLDL - Unable to report VLDL and LDL due to a  - Triglyceride value that is 400 mg/dL or   - greater.    LDL Chol Calc (NIH)  Date Value Ref Range Status   06/23/2023 81 0 - 99 mg/dL Final   HDL Cholesterol  Date Value Ref Range Status  04/16/2014 24 (L) 40 - 60 mg/dL Final   HDL  Date Value Ref Range Status  06/23/2023 37 (L) >39 mg/dL Final   Triglycerides  Date Value Ref Range Status  06/23/2023 114 0 - 149 mg/dL Final  78/29/5621 308 (H) 0 - 200 mg/dL Final         Passed - Patient is not pregnant      Passed - Valid encounter within last 12 months    Recent Outpatient Visits           2 weeks ago Acute bronchitis with COPD (HCC)   [place] [facility] [place], [place], PA-C       Future Appointments             In 1 week Debera Lat, PA-C [facility], [facility]   In 2 months Herbie Baltimore, Piedad Climes, MD [facility]  at [place]   In 7 months Deirdre Evener, MD [place] [facility]             [facility] INHUB 100-50 MCG/ACT AEPB [Pharmacy Med Name: fluticasone-salmeterol [facility] INHUB) 100-50 MCG/ACT Aerosol Powder, Breath Activtivatede] 180 each 3    Sig: Inhale 1 puff into the lungs 2 (two) times daily.     Pulmonology:  Combination Products Passed - 07/08/2023 10:18 AM      Passed - Valid encounter within last 12 months    Recent Outpatient Visits           2 weeks ago Acute bronchitis with COPD [facility])   [place] [facility] [place], [place], PA-C       Future Appointments             In 1 week Debera Lat, PA-C [facility], [facility]   In 2 months Herbie Baltimore Piedad Climes, MD [place] [facility] [place]   In 7 months Deirdre Evener, MD [place] [facility]             TRUE METRIX BLOOD GLUCOSE TEST test strip [Pharmacy Med Name: glucose blood (TRUE METRIX BLOOD GLUCOSE TEST) test strip] 100 each 3    Sig: Use to check blood glucose 3 times daily.     Endocrinology: Diabetes - Testing Supplies Passed - 07/08/2023 10:18 AM      Passed - Valid encounter within last 12 months    Recent  Outpatient Visits           2 weeks ago Acute bronchitis with COPD [facility])   [place] [facility] [place], [place], PA-C       Future Appointments             In 1 week Debera Lat, PA-C [facility], [facility]   In 2 months Herbie Baltimore, Piedad Climes, MD [facility] at [place]   In 7 months Deirdre Evener, MD [facility] [place] [facility]             Rightest GL300 Lancets MISC [Pharmacy Med Name: Rightest GL300 Lancets Misc] 100 each 3    Sig: test up to 4 times a day as directed     Endocrinology: Diabetes - Testing Supplies Passed - 07/08/2023 10:18 AM      Passed - Valid encounter within last 12 months    Recent Outpatient Visits           2 weeks ago Acute bronchitis with COPD [facility])   [place] [facility] [place], [place], PA-C       Future Appointments             In 1 week Debera Lat, PA-C [facility], [facility]   In 2 months Marykay Lex, MD [place] [facility] [place]   In 7 months Deirdre Evener, MD [place] [facility]             pantoprazole (PROTONIX) 20 MG tablet 30 tablet 2    Sig: Take 1 tablet (20 mg total) by mouth daily.     Gastroenterology: Proton Pump Inhibitors Passed - 07/08/2023 10:18 AM      Passed - Valid encounter within last 12 months    Recent Outpatient Visits           2 weeks ago Acute bronchitis with COPD [facility])    [facility]  Debera Lat, PA-C       Future Appointments             In 1 week Debera Lat, PA-C [facility], [facility]   In 2 months Herbie Baltimore, Piedad Climes, MD [place] [facility] [place]   In 7 months Deirdre Evener, MD [place] [facility]             lisinopril (ZESTRIL) 20 MG tablet 90 tablet 3    Sig: TAKE ONE TABLET BY MOUTH ONCE EVERY DAY     Cardiovascular:  ACE Inhibitors Failed - 07/08/2023 10:18 AM       Failed - Last BP in normal range    BP Readings from Last 1 Encounters:  06/23/23 (!) 143/88         Passed - Cr in normal range and within 180 days    Creatinine  Date Value Ref Range Status  08/03/2014 0.98 0.60 - 1.30 mg/dL Final   Creatinine, Ser  Date Value Ref Range Status  06/23/2023 0.89 0.76 - 1.27 mg/dL Final         Passed - K in normal range and within 180 days    Potassium  Date Value Ref Range Status  06/23/2023 4.2 3.5 - 5.2 mmol/L Final  08/03/2014 4.1 3.5 - 5.1 mmol/L Final         Passed - Patient is not pregnant      Passed - Valid encounter within last 6 months    Recent Outpatient Visits           2 weeks ago Acute bronchitis with COPD [facility])   [place] [facility] [place], [employer], PA-C       Future Appointments             In 1 week Debera Lat, PA-C [facility], [facility]   In 2 months Herbie [place], Piedad Climes, MD [place] [facility] [place]   In 7 months Deirdre Evener, MD [facility] [place] [facility]

## 2023-07-08 NOTE — Telephone Encounter (Signed)
Pt's mother is calling in requesting for Janna to call her regarding the pt's medication. Please follow up with Hilda Lias.

## 2023-07-08 NOTE — Telephone Encounter (Signed)
Requested medication (s) are due for refill today: routing for review  Requested medication (s) are on the active medication list: yes  Last refill:  multiple dates  Future visit scheduled: yes  Notes to clinic:  Unable to refill per protocol, last refill by another provider. Routing for approval.     Requested Prescriptions  Pending Prescriptions Disp Refills   amLODipine (NORVASC) 5 MG tablet 30 tablet 2    Sig: Take 1 tablet (5 mg total) by mouth daily.     Cardiovascular: Calcium Channel Blockers 2 Failed - 07/08/2023  3:49 PM      Failed - Last BP in normal range    BP Readings from Last 1 Encounters:  06/23/23 (!) 143/88         Passed - Last Heart Rate in normal range    Pulse Readings from Last 1 Encounters:  06/23/23 83         Passed - Valid encounter within last 6 months    Recent Outpatient Visits           2 weeks ago Acute bronchitis with COPD [facility])   [place] [facility] [place], [place], PA-C       Future Appointments             In 1 week Debera Lat, PA-C [facility], [facility]   In 2 months Marykay Lex, MD [place] [facility] [place]   In 7 months Deirdre Evener, MD [place] [facility]             atorvastatin (LIPITOR) 40 MG tablet 30 tablet 2    Sig: Take 1 tablet (40 mg total) by mouth daily.     Cardiovascular:  Antilipid - Statins Failed - 07/08/2023  3:49 PM      Failed - Lipid Panel in normal range within the last 12 months    Cholesterol, Total  Date Value Ref Range Status  06/23/2023 139 100 - 199 mg/dL Final   Cholesterol  Date Value Ref Range Status  04/16/2014 161 0 - 200 mg/dL Final   Ldl Cholesterol, Calc  Date Value Ref Range Status  04/16/2014 SEE COMMENT 0 - 100 mg/dL Final    Comment:    LDL/VLDL - Unable to report VLDL and LDL due to a  - Triglyceride value that is 400 mg/dL or   - greater.    LDL Chol Calc (NIH)  Date Value Ref Range  Status  06/23/2023 81 0 - 99 mg/dL Final   HDL Cholesterol  Date Value Ref Range Status  04/16/2014 24 (L) 40 - 60 mg/dL Final   HDL  Date Value Ref Range Status  06/23/2023 37 (L) >39 mg/dL Final   Triglycerides  Date Value Ref Range Status  06/23/2023 114 0 - 149 mg/dL Final  82/95/6213 086 (H) 0 - 200 mg/dL Final         Passed - Patient is not pregnant      Passed - Valid encounter within last 12 months    Recent Outpatient Visits           2 weeks ago Acute bronchitis with COPD [facility])   [place] [facility] [place], [place], PA-C       Future Appointments             In 1 week Debera Lat, PA-C [facility], [facility]   In 2 months Marykay Lex, MD  [place] [facility] [place]   In 7 months Deirdre Evener, MD [facility] [place] [facility]             [facility] INHUB 100-50 MCG/ACT AEPB [Pharmacy Med Name: fluticasone-salmeterol [facility] INHUB) 100-50 MCG/ACT Aerosol Powder, Breath Activtivatede] 180 each 3    Sig: Inhale 1 puff into the lungs 2 (two) times daily.     Pulmonology:  Combination Products Passed - 07/08/2023  3:49 PM      Passed - Valid encounter within last 12 months    Recent Outpatient Visits           2 weeks ago Acute bronchitis with COPD [facility])   [place] [facility] [place], [place], [place]       Future Appointments             In 1 week Debera Lat, PA-C [facility], [facility]   In 2 months Herbie [place] Piedad Climes, MD [place] [facility] at [place]   In 7 months Deirdre Evener, MD [place] [facility]             TRUE METRIX BLOOD GLUCOSE TEST test strip [Pharmacy Med Name: glucose blood (TRUE METRIX BLOOD GLUCOSE TEST) test strip] 100 each 3    Sig: Use to check blood glucose 3 times daily.     Endocrinology: Diabetes - Testing Supplies Passed - 07/08/2023  3:49 PM      Passed - Valid encounter within last 12 months     Recent Outpatient Visits           2 weeks ago Acute bronchitis with COPD [facility])   [place] [facility] [place], [place], [place]       Future Appointments             In 1 week Debera Lat, PA-C [facility], [facility]   In 2 months Herbie Baltimore, Piedad Climes, MD [facility] at [place]   In 7 months Deirdre Evener, MD [facility] [place] [facility]             Rightest GL300 Lancets MISC [Pharmacy Med Name: Rightest GL300 Lancets Misc] 100 each 3    Sig: test up to 4 times a day as directed     Endocrinology: Diabetes - Testing Supplies Passed - 07/08/2023  3:49 PM      Passed - Valid encounter within last 12 months    Recent Outpatient Visits           2 weeks ago Acute bronchitis with COPD [facility])   [place] [facility] [place], [place], PA-C       Future Appointments             In 1 week Debera Lat, PA-C [facility], [facility]   In 2 months Marykay Lex, MD [place] [facility] at [place]   In 7 months Deirdre Evener, MD [place] [facility]             pantoprazole (PROTONIX) 20 MG tablet 30 tablet 2    Sig: Take 1 tablet (20 mg total) by mouth daily.     Gastroenterology: Proton Pump Inhibitors Passed - 07/08/2023  3:49 PM      Passed - Valid encounter within last 12 months    Recent Outpatient Visits           2 weeks ago Acute bronchitis with COPD (HCC)  [facility] [place], [place], PA-C       Future Appointments             In 1 week Debera Lat, PA-C [facility] [place]   In 2 months Herbie Baltimore, Piedad Climes, MD [place] [facility] at [place]   In 7 months Deirdre Evener, MD [place] [facility]             lisinopril (ZESTRIL) 20 MG tablet 90 tablet 3    Sig: TAKE ONE TABLET BY MOUTH ONCE EVERY DAY     Cardiovascular:  ACE Inhibitors Failed - 07/08/2023  3:49 PM       Failed - Last BP in normal range    BP Readings from Last 1 Encounters:  06/23/23 (!) 143/88         Passed - Cr in normal range and within 180 days    Creatinine  Date Value Ref Range Status  08/03/2014 0.98 0.60 - 1.30 mg/dL Final   Creatinine, Ser  Date Value Ref Range Status  06/23/2023 0.89 0.76 - 1.27 mg/dL Final         Passed - K in normal range and within 180 days    Potassium  Date Value Ref Range Status  06/23/2023 4.2 3.5 - 5.2 mmol/L Final  08/03/2014 4.1 3.5 - 5.1 mmol/L Final         Passed - Patient is not pregnant      Passed - Valid encounter within last 6 months    Recent Outpatient Visits           2 weeks ago Acute bronchitis with COPD [facility])   [place] [facility] [place], [place], PA-C       Future Appointments             In 1 week Debera Lat, PA-C [facility], [facility]   In 2 months Herbie [place], Piedad Climes, MD [place] [facility] at [place]   In 7 months Deirdre Evener, MD [facility] [place] [facility]

## 2023-07-09 ENCOUNTER — Other Ambulatory Visit: Payer: Self-pay

## 2023-07-09 ENCOUNTER — Other Ambulatory Visit: Payer: Self-pay | Admitting: Physician Assistant

## 2023-07-09 ENCOUNTER — Other Ambulatory Visit: Payer: Self-pay | Admitting: Gerontology

## 2023-07-09 DIAGNOSIS — E119 Type 2 diabetes mellitus without complications: Secondary | ICD-10-CM

## 2023-07-09 MED ORDER — ACCU-CHEK GUIDE W/DEVICE KIT
PACK | 0 refills | Status: AC
  Filled 2023-07-09: qty 1, 1d supply, fill #0

## 2023-07-09 MED FILL — Lancets: 25 days supply | Qty: 100 | Fill #0 | Status: AC

## 2023-07-09 MED FILL — Glucose Blood Test Strip: 30 days supply | Qty: 100 | Fill #0 | Status: AC

## 2023-07-12 ENCOUNTER — Telehealth: Payer: Self-pay | Admitting: Physician Assistant

## 2023-07-12 ENCOUNTER — Other Ambulatory Visit: Payer: Self-pay

## 2023-07-12 LAB — COLOGUARD

## 2023-07-12 MED ORDER — TECHLITE PEN NEEDLES 31G X 5 MM MISC
1.0000 | Freq: Every day | 2 refills | Status: DC
  Filled 2023-07-12: qty 100, 25d supply, fill #0
  Filled 2023-09-08: qty 100, 25d supply, fill #1
  Filled 2023-10-12: qty 100, 25d supply, fill #2

## 2023-07-12 NOTE — Telephone Encounter (Signed)
Requested medication (s) are due for refill today: yes  Requested medication (s) are on the active medication list: no  Last refill:  11/17/22 #100 2 RF   Future visit scheduled: yes  Notes to clinic:  prescriber not at this practice/ requesting "Techlite" pen needles- needs new order   Requested Prescriptions  Pending Prescriptions Disp Refills   Insulin Pen Needle (TECHLITE PEN NEEDLES) 31G X 5 MM MISC 100 each 2    Sig: 1 Needle at bedtime.     There is no refill protocol information for this order

## 2023-07-12 NOTE — Telephone Encounter (Signed)
Followed up with patient to reschedule Nov 25th,2024 appointment, Provider will be out of the office..Patient stated that he has found another provider and will no longer need our services. (Transfer of Care)

## 2023-07-15 ENCOUNTER — Other Ambulatory Visit: Payer: Self-pay

## 2023-07-19 ENCOUNTER — Ambulatory Visit: Payer: Medicaid Other | Admitting: Physician Assistant

## 2023-07-26 ENCOUNTER — Other Ambulatory Visit: Payer: Self-pay

## 2023-07-26 MED ORDER — NICOTINE 21 MG/24HR TD PT24
21.0000 mg | MEDICATED_PATCH | Freq: Every day | TRANSDERMAL | 0 refills | Status: DC
  Filled 2023-07-26: qty 28, 28d supply, fill #0

## 2023-07-26 MED ORDER — NICOTINE 7 MG/24HR TD PT24: 7.0000 mg | MEDICATED_PATCH | Freq: Every day | TRANSDERMAL | 0 refills | Status: DC

## 2023-07-26 MED ORDER — NICOTINE 14 MG/24HR TD PT24
14.0000 mg | MEDICATED_PATCH | Freq: Every day | TRANSDERMAL | 0 refills | Status: AC
  Filled 2023-07-26: qty 14, 14d supply, fill #0

## 2023-07-27 ENCOUNTER — Other Ambulatory Visit: Payer: Self-pay

## 2023-07-28 ENCOUNTER — Other Ambulatory Visit: Payer: Self-pay

## 2023-08-13 ENCOUNTER — Institutional Professional Consult (permissible substitution): Payer: Medicaid Other | Admitting: Student in an Organized Health Care Education/Training Program

## 2023-08-13 ENCOUNTER — Other Ambulatory Visit: Payer: Self-pay

## 2023-08-13 MED FILL — Glucose Blood Test Strip: 30 days supply | Qty: 100 | Fill #1 | Status: CN

## 2023-09-01 ENCOUNTER — Other Ambulatory Visit: Payer: Self-pay

## 2023-09-01 MED FILL — Glucose Blood Test Strip: 30 days supply | Qty: 100 | Fill #1 | Status: AC

## 2023-09-06 ENCOUNTER — Other Ambulatory Visit: Payer: Self-pay | Admitting: Physician Assistant

## 2023-09-06 ENCOUNTER — Other Ambulatory Visit: Payer: Self-pay | Admitting: Gerontology

## 2023-09-06 ENCOUNTER — Other Ambulatory Visit: Payer: Self-pay

## 2023-09-06 DIAGNOSIS — I10 Essential (primary) hypertension: Secondary | ICD-10-CM

## 2023-09-06 DIAGNOSIS — E1169 Type 2 diabetes mellitus with other specified complication: Secondary | ICD-10-CM

## 2023-09-07 ENCOUNTER — Other Ambulatory Visit: Payer: Self-pay

## 2023-09-07 MED FILL — Amlodipine Besylate Tab 5 MG (Base Equivalent): ORAL | 90 days supply | Qty: 90 | Fill #0 | Status: AC

## 2023-09-07 MED FILL — Atorvastatin Calcium Tab 40 MG (Base Equivalent): ORAL | 90 days supply | Qty: 90 | Fill #0 | Status: AC

## 2023-09-07 NOTE — Telephone Encounter (Signed)
 Requested Prescriptions  Pending Prescriptions Disp Refills   atorvastatin  (LIPITOR) 40 MG tablet 90 tablet 1    Sig: Take 1 tablet (40 mg total) by mouth daily.     Cardiovascular:  Antilipid - Statins Failed - 09/07/2023  1:46 PM      Failed - Lipid Panel in normal range within the last 12 months    Cholesterol, Total  Date Value Ref Range Status  06/23/2023 139 100 - 199 mg/dL Final   Cholesterol  Date Value Ref Range Status  04/16/2014 161 0 - 200 mg/dL Final   Ldl Cholesterol, Calc  Date Value Ref Range Status  04/16/2014 SEE COMMENT 0 - 100 mg/dL Final    Comment:    LDL/VLDL - Unable to report VLDL and LDL due to a  - Triglyceride value that is 400 mg/dL or   - greater.    LDL Chol Calc (NIH)  Date Value Ref Range Status  06/23/2023 81 0 - 99 mg/dL Final   HDL Cholesterol  Date Value Ref Range Status  04/16/2014 24 (L) 40 - 60 mg/dL Final   HDL  Date Value Ref Range Status  06/23/2023 37 (L) >39 mg/dL Final   Triglycerides  Date Value Ref Range Status  06/23/2023 114 0 - 149 mg/dL Final  91/75/7984 589 (H) 0 - 200 mg/dL Final         Passed - Patient is not pregnant      Passed - Valid encounter within last 12 months    Recent Outpatient Visits           2 months ago Acute bronchitis with COPD [facility])   [place] [facility] [place], [place], PA-C       Future Appointments             In 3 weeks Anner Alm ORN, MD [place] [facility] [place]   In 5 months Hester Alm BROCKS, MD [place] [facility]             amLODipine  (NORVASC ) 5 MG tablet 90 tablet 1    Sig: Take 1 tablet (5 mg total) by mouth daily.     Cardiovascular: Calcium  Channel Blockers 2 Failed - 09/07/2023  1:46 PM      Failed - Last BP in normal range    BP Readings from Last 1 Encounters:  06/23/23 (!) 143/88         Passed - Last Heart Rate in normal range    Pulse Readings from Last 1 Encounters:  06/23/23 83         Passed -  Valid encounter within last 6 months    Recent Outpatient Visits           2 months ago Acute bronchitis with [facility])    [facility] [place], [place], PA-C       Future Appointments             In 3 weeks Anner Alm ORN, MD [facility] at [place]   In 5 months Hester Alm BROCKS, MD [facility] [facility]

## 2023-09-08 ENCOUNTER — Other Ambulatory Visit: Payer: Self-pay

## 2023-09-08 MED ORDER — FLUTICASONE-SALMETEROL 100-50 MCG/ACT IN AEPB
1.0000 | INHALATION_SPRAY | Freq: Two times a day (BID) | RESPIRATORY_TRACT | 3 refills | Status: DC
  Filled 2023-09-08: qty 60, 30d supply, fill #0
  Filled 2023-09-21 – 2023-11-01 (×2): qty 180, 90d supply, fill #1
  Filled 2023-11-01: qty 60, 30d supply, fill #1
  Filled 2023-11-01: qty 180, 90d supply, fill #1
  Filled 2024-01-04: qty 60, 30d supply, fill #2
  Filled 2024-03-17: qty 60, 30d supply, fill #3
  Filled 2024-05-04: qty 60, 30d supply, fill #4
  Filled 2024-07-19: qty 60, 30d supply, fill #5

## 2023-09-14 ENCOUNTER — Ambulatory Visit: Payer: Medicaid Other | Admitting: Student in an Organized Health Care Education/Training Program

## 2023-09-14 ENCOUNTER — Encounter: Payer: Self-pay | Admitting: Student in an Organized Health Care Education/Training Program

## 2023-09-14 ENCOUNTER — Other Ambulatory Visit: Payer: Self-pay

## 2023-09-14 VITALS — BP 120/88 | HR 110 | Temp 97.6°F | Ht 70.0 in | Wt 218.0 lb

## 2023-09-14 DIAGNOSIS — F17210 Nicotine dependence, cigarettes, uncomplicated: Secondary | ICD-10-CM

## 2023-09-14 DIAGNOSIS — R0602 Shortness of breath: Secondary | ICD-10-CM

## 2023-09-14 DIAGNOSIS — F17200 Nicotine dependence, unspecified, uncomplicated: Secondary | ICD-10-CM

## 2023-09-14 MED ORDER — SPIRIVA RESPIMAT 2.5 MCG/ACT IN AERS
2.0000 | INHALATION_SPRAY | Freq: Every day | RESPIRATORY_TRACT | 11 refills | Status: AC
  Filled 2023-09-14: qty 4, 30d supply, fill #0
  Filled 2023-10-12: qty 4, 30d supply, fill #1
  Filled 2024-01-04: qty 4, 30d supply, fill #2
  Filled 2024-03-17: qty 4, 30d supply, fill #3

## 2023-09-14 MED ORDER — NICOTINE POLACRILEX 2 MG MT LOZG
2.0000 mg | LOZENGE | OROMUCOSAL | 3 refills | Status: DC | PRN
  Filled 2023-09-14: qty 81, 7d supply, fill #0

## 2023-09-14 NOTE — Patient Instructions (Signed)
The [facility] Quitline: Call 1-800-QUIT-NOW 575-707-4353). The [facility] Quitline is a free service for [employer]. Trained counselors are available from 8 am until 3 am, 365 days per year. Services are available in both [place] and [place].   Web Resources Free online support programs can help you track your progress and share experiences with others who are quitting. These are examples: www.becomeanex.org www.trytostop.org  www.smokefree.gov  www.[employer].com.br.aspx  [facility]: offers comprehensive in-person tobacco treatment counseling at [facility] ([address], [place] [address]).  Open to everyone. Virtual appointments available. Free parking. Call [phone] to schedule an appointment or [phone] for general information.    Tobacco Cessation Medications  Nicotine Replacement Therapy (NRT)  Nicotine is the addictive part of tobacco smoke, but not the most dangerous part. There are 7000 other toxins in cigarettes, including carbon monoxide, that cause disease. People do not generally become addicted to medication. Common problems: People don't use enough medication or stop too early. Medications are safe and effective. Overdose is very uncommon. Use medications as long as needed (3 months minimum). Some combinations work better than single medications. Long acting medications like the NRT patch and bupropion provide continuous treatment for withdrawal symptoms.  PLUS  Short acting medications like the NRT gum, lozenge, inhaler, and nasal spray help people to cope with breakthrough cravings.  ? Nicotine Patch  Place patch on hairless skin on upper body, including arms and back. Each day: discard old patch, shower, apply new patch to a different site. Apply hydrocortisone cream to mildly red/irritated areas. Call provider if rash develops. If patch causes sleep disturbance, remove patch  at bedtime and replace each morning after shower. Side effects may include: skin irritation, headache, insomnia, abnormal/vivid dreams.  ? Nicotine Gum  Chew gum slowly, park in cheek when peppery taste or tingling sensation begins (about 15-30 chews). When taste or tingling goes away, begin chewing again. Use until nicotine is gone (taste or tingle does not return, usually 30 minutes). Park in different areas of mouth. Nicotine is absorbed through the lining of the mouth. Use enough to control cravings, up to 24 pieces per day (if used alone). Avoid eating or drinking for 15 minutes before using and during use. Side effects may include: mouth/jaw soreness, hiccups, indigestion, hypersalivation.  If gum is not chewed correctly, additional side effects may include lightheadedness, nausea/vomiting, throat and mouth irritation.  ? Nicotine Lozenge  Allow to dissolve slowly in mouth (20-30 minutes). Do not chew or swallow. Nicotine release may cause a warm tingling sensation. Occasionally rotate to different areas of the mouth. Use enough to control cravings, up to 20 lozenges per day (if used alone). Avoid eating or drinking for 15 minutes before using and during use. Side effects may include: nausea, hiccups, cough, heartburn, headache, gas, insomnia.  ? Nicotine Nasal Spray Use 1 spray in each nostril (1 dose) and tilt head back for 1 minute. Do not sniff, swallow, or inhale through nose.  Use at least 8 doses (1 spray in each nostril) , up to 40 doses per day (if used alone). To reduce nasal irritation, spray on cotton swab and insert into nose. Side effects may include: nasal and/or throat irritation (hot, peppery, or burning sensation), nasal irritation, tearing, sneezing, cough, headache.  ? Nicotine Oral Inhaler (puffer) Inhale into the back of the throat or puff in short breaths. Do not inhale into the lungs.  Puff continuously for 20 minutes (about 80 puffs) until cartridge  is  empty. Change cartridge when it loses the "burning in throat" sensation (feels like air only). Open cartridges can be saved and used again within 24 hours. Use at least 6 and up to 16 cartridges per day (if used alone).  Avoid eating or drinking for 15 minutes before using and during use. Side effects may include: mouth and/or throat irritation, unpleasant taste, cough, nasal irritation, indigestion, hiccups, headache.  ? Chantix (varenicline) Days 1-3: Take one 0.5 mg white pill each morning for 3 days, one week before quit date. Days 4-7: Increase to one 0.5 mg white pill twice a day in morning and evening for 4 days.  On Day 8 (target quit date), increase to one 1 mg blue pill twice a day. Maintain this dose for a minimum of 3 months. Take with food and a full glass of water to reduce nausea. Be sure that the two doses are at least 8 hours apart, but try to take second dose early in the evening (i.e. 6 pm) to avoid sleep problems. Common side effects include: nausea, insomnia, headache, abnormal/vivid dreams. Tell your doctor if you have any history of psychiatric illness prior to starting Chantix.  STOP taking CHANTIX and contact a healthcare provider immediately if you experience agitation, hostility, depressed mood, changes in thoughts or behavior that are not typical for you, thinking about or attempting suicide, allergic or skin reactions including swelling, rash, redness, or peeling of the skin.  For patients who have heart disease: Smoking is a major risk factor for cardiovascular disease, and Chantix can help you quit smoking. Chantix may be associated with a small, increased risk of certain heart events in patients who have heart disease. If you have any new or worsening symptoms of heart disease while taking Chantix, such as shortness of breath or trouble breathing, new or worsening chest pain, or new or worsening pain in your legs when walking, call your doctor or get emergency medical  help immediately.  ? Wellbutrin / Zyban (bupropion) Take one 150 mg pill each morning for 3 days, one week before target quit date. On Day 4, increase to one 150 mg pill twice a day, morning and evening.  Maintain this dose for a minimum of 3 months. Be sure that the two doses are at least 8 hours apart, but try to take second dose early in the evening (i.e. 6 pm) to avoid sleep problems. Avoid or minimize use of alcohol when taking this medication. Common side effects include: dry mouth, headache, insomnia, nausea, weight loss.  Risk of seizure is 08/998. STOP taking BUPROPION and contact a healthcare provider immediately if you experience agitation, hostility, depressed mood, changes in thoughts or behavior that are not typical for you, thinking about or attempting suicide, allergic or skin reactions including swelling, rash, redness, or peeling of the skin.

## 2023-09-14 NOTE — Progress Notes (Signed)
Assessment & Plan:   1. Shortness of breath (Primary)  Patient is presenting for the evaluation of shortness of breath with exertion with a history of asthma as well as current active tobacco use.  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  On exam, his lungs are clear and he does not have any rhonchi or wheeze.  History is somewhat limited and the patient is not able to elaborate further on his previous diagnosis.  Given his smoking history as well as occupational exposure, I am inclined to add LAMA therapy to his current treatment plan with Spiriva Respimat in addition to his current Advair.  I will also obtain a pulmonary function test to assess spirometry for obstruction, and lung volumes/DLCO for restriction.  - Pulmonary Function Test [facility] Only; Future - Tiotropium Bromide Monohydrate (SPIRIVA RESPIMAT) 2.5 MCG/ACT AERS; Inhale 2 puffs into the lungs daily.  Dispense: 4 g; Refill: 11  2. Tobacco use disorder  Patient continues to actively smoke, and was counseled at length regarding smoking cessation. He was previously given nicotine patches and I will add nicotine lozenges to his current regimen to help with his efforts.  - nicotine polacrilex (NICOTINE MINI) 2 MG lozenge; Take 1 lozenge (2 mg total) by mouth every 2 (two) hours as needed for smoking cessation.  Dispense: 81 lozenge; Refill: 3   Return in about 3 months (around 12/13/2023).  I spent 60 minutes caring for this patient today, including preparing to see the patient, obtaining a medical history , reviewing a separately obtained history, performing a medically appropriate examination and/or evaluation, counseling and educating the patient/family/caregiver, ordering medications, tests, or procedures, and documenting clinical information in the electronic health record. 3 minutes spent counseling the patient regarding smoking cessation.  Raechel Chute,  MD [facility]  End of visit medications:  Meds ordered this encounter  Medications   Tiotropium Bromide Monohydrate (SPIRIVA RESPIMAT) 2.5 MCG/ACT AERS    Sig: Inhale 2 puffs into the lungs daily.    Dispense:  4 g    Refill:  11   nicotine polacrilex (NICOTINE MINI) 2 MG lozenge    Sig: Take 1 lozenge (2 mg total) by mouth every 2 (two) hours as needed for smoking cessation.    Dispense:  81 lozenge    Refill:  3    stock size 81     Current Outpatient Medications:    Accu-Chek Softclix Lancets lancets, Test blood sugars 4 (four) times daily., Disp: 100 each, Rfl: 3   amLODipine (NORVASC) 5 MG tablet, Take 1 tablet (5 mg total) by mouth daily., Disp: 90 tablet, Rfl: 1   aspirin 81 MG chewable tablet, Chew 1 tablet (81 mg total) by mouth daily., Disp: 90 tablet, Rfl: 1   atorvastatin (LIPITOR) 40 MG tablet, Take 1 tablet (40 mg total) by mouth daily., Disp: 90 tablet, Rfl: 1   Blood Glucose Monitoring Suppl (ACCU-CHEK GUIDE) w/Device KIT, check blood sugar as directed, Disp: 1 kit, Rfl: 0   Blood Glucose Monitoring Suppl (TRUE METRIX METER) w/Device KIT, use to check blood glucose, Disp: 1 kit, Rfl: 0   dapagliflozin propanediol (FARXIGA) 5 MG TABS tablet, Take 1 tablet (5 mg total) by mouth daily., Disp: 90 tablet, Rfl: 3   fluticasone-salmeterol (ADVAIR DISKUS) 100-50 MCG/ACT AEPB, Inhale 1 puff into the lungs 2 (two) times daily., Disp: 180 each, Rfl: 3   glucose blood (ACCU-CHEK GUIDE TEST) test  strip, Test blood sugar 3 (three) times daily., Disp: 100 each, Rfl: 3   glucose blood (RIGHTEST GS550 BLOOD GLUCOSE) test strip, test up to 4 times a day, Disp: 100 each, Rfl: 3   Insulin Glargine (BASAGLAR KWIKPEN) 100 UNIT/ML, Inject 16 Units into the skin at bedtime., Disp: 15 mL, Rfl: 5   insulin lispro (HUMALOG KWIKPEN) 100 UNIT/ML KwikPen, Inject 0.02 mLs (2 Units total) into the skin 3 (three) times daily with meals. 0-149 = 0 units; 150-199= 2 units; 200-249= 4  units; 250-299 = 6 units; 300-349= 8 units; 350-399= 10 units; 400-449= 12 units and please call clinic to report sugar and insulin taken, Disp: 15 mL, Rfl: 1   Insulin Pen Needle (TECHLITE PEN NEEDLES) 31G X 5 MM MISC, Use as directed with insulin., Disp: 100 each, Rfl: 2   lisinopril (ZESTRIL) 20 MG tablet, TAKE ONE TABLET BY MOUTH ONCE EVERY DAY, Disp: 90 tablet, Rfl: 3   metFORMIN (GLUCOPHAGE) 500 MG tablet, Take 2 tablets (1,000 mg total) by mouth 2 (two) times daily with a meal., Disp: 120 tablet, Rfl: 1   nicotine (NICODERM CQ - DOSED IN MG/24 HOURS) 14 mg/24hr patch, Place 1 patch (14 mg total) onto the skin daily for 14 days. Remove patch before applying another. Continue for 2 weeks. Start after completing course of 21 mg patch., Disp: 14 patch, Rfl: 0   nicotine (NICODERM CQ - DOSED IN MG/24 HOURS) 21 mg/24hr patch, Place 1 patch (21 mg total) onto the skin daily for 42 days. Remove patch before applying another. Continue for 6 weeks., Disp: 42 patch, Rfl: 0   nicotine (NICODERM CQ - DOSED IN MG/24 HR) 7 mg/24hr patch, Place 1 patch (7 mg total) onto the skin daily for 14 days. Remove patch before applying another. Continue for 2 weeks. Start after completing course of 14 mg patch., Disp: 14 patch, Rfl: 0   nicotine polacrilex (NICOTINE MINI) 2 MG lozenge, Take 1 lozenge (2 mg total) by mouth every 2 (two) hours as needed for smoking cessation., Disp: 81 lozenge, Rfl: 3   nitroGLYCERIN (NITROSTAT) 0.4 MG SL tablet, Dissolve 1 tablet (0.4 mg total) under the tongue once every 5 (five) minutes as needed for chest pain. If no relief after first dose, call 911. (Max of 3 doses in 15 minutes)., Disp: 30 tablet, Rfl: 1   pantoprazole (PROTONIX) 20 MG tablet, Take 1 tablet (20 mg total) by mouth daily., Disp: 30 tablet, Rfl: 2   Tiotropium Bromide Monohydrate (SPIRIVA RESPIMAT) 2.5 MCG/ACT AERS, Inhale 2 puffs into the lungs daily., Disp: 4 g, Rfl: 11   TRUEplus Lancets 33G MISC, use to check blood  sugar, Disp: 100 each, Rfl: 0   PROAIR HFA 108 (90 Base) MCG/ACT inhaler, INHALE 2 PUFFS BY MOUTH ONCE EVERY 4 HOURS AS NEEDED. REPLACES ALBUTEROL., Disp: 25.5 g, Rfl: 3   Subjective:   PATIENT ID: Roy Norton GENDER: male DOB: April 04, 1961, MRN: [id number]  Chief Complaint  Patient presents with   Consult    Cough, wheezing and shortness of breath on exertion.     HPI  Patient is a pleasant 63 year old male presenting today to clinic for the evaluation of shortness of breath.  Patient presents for the evaluation of shortness of breath that is most notable with exertion.  He does not have any symptoms at rest.  He denies having any cough, sputum production, hemoptysis, wheezing, chest tightness, or chest pain.  He reports history of asthma that is controlled with  Advair with which he is compliant.  Patient was seen by his primary care physician and was referred to pulmonary.  He is unable to further elaborate on the nature of the referral or the nature of the symptoms.  He tells me that he is in his usual state of health and there has not been any change or difference in his symptoms recently.  He denies having had any exacerbations of his respiratory illness and nor having had to use prednisone or antibiotics recently. He has not had to use his rescue inhaler recently.  On review of the patient's chart, he has been followed by his primary care physician, last seen in October 2024.  Patient is noted to have presumptive diagnosis of COPD given his smoking history.  He was noted to have rhonchi on lung auscultation during said visit.  Patient previously worked in the [employer] as well as worked with [employer] with plenty of exposure to dust.  He currently smokes up to 1 pack a day and has done so for many years.  Ancillary information including prior medications, full medical/surgical/family/social histories, and PFTs (when available) are listed below and have been reviewed.   Review of  Systems  Constitutional:  Negative for chills, diaphoresis, fever, malaise/fatigue and weight loss.  Respiratory:  Positive for shortness of breath. Negative for cough, hemoptysis, sputum production and wheezing.   Cardiovascular:  Negative for chest pain.     Objective:   Vitals:   09/14/23 1529  BP: 120/88  Pulse: (!) 110  Temp: 97.6 F (36.4 C)  TempSrc: Temporal  SpO2: 95%  Weight: 218 lb (98.9 kg)  Height: 5\' 10"  (1.778 m)   95% on RA  BMI Readings from Last 3 Encounters:  09/14/23 31.28 kg/m  06/23/23 30.52 kg/m  04/21/23 30.94 kg/m   Wt Readings from Last 3 Encounters:  09/14/23 218 lb (98.9 kg)  06/23/23 212 lb 11.2 oz (96.5 kg)  04/21/23 215 lb 9.6 oz (97.8 kg)    Physical Exam Constitutional:      Appearance: He is obese. He is not ill-appearing.  Cardiovascular:     Rate and Rhythm: Normal rate and regular rhythm.     Pulses: Normal pulses.     Heart sounds: Normal heart sounds.  Pulmonary:     Effort: Pulmonary effort is normal.     Breath sounds: Normal breath sounds.  Abdominal:     Palpations: Abdomen is soft.  Musculoskeletal:     Right lower leg: No edema.     Left lower leg: No edema.  Neurological:     General: No focal deficit present.     Mental Status: He is alert and oriented to person, place, and time. Mental status is at baseline.     Ancillary Information    Past Medical History:  Diagnosis Date   Asthma    Diabetes mellitus without complication (HCC)    Edentulous    GERD (gastroesophageal reflux disease)    Glaucoma    Heart murmur    Hyperlipidemia    Hypertension    Stroke (HCC)    No deficits     Family History  Problem Relation Age of Onset   Hypertension Mother    Hypertension Father    Diabetes Father    Heart Problems Maternal Grandmother    Emphysema Maternal Grandfather    Emphysema Paternal Grandfather      Past Surgical History:  Procedure Laterality Date   CARDIAC CATHETERIZATION  2013  Social History   Socioeconomic History   Marital status: Single    Spouse name: Not on file   Number of children: Not on file   Years of education: Not on file   Highest education level: 11th grade  Occupational History   Occupation: none  Tobacco Use   Smoking status: Every Day    Current packs/day: 0.50    Average packs/day: 0.5 packs/day for 10.0 years (5.0 ttl pk-yrs)    Types: Cigarettes   Smokeless tobacco: Never   Tobacco comments:    1-2 cigarettes daily  Vaping Use   Vaping status: Never Used  Substance and Sexual Activity   Alcohol use: No    Alcohol/week: 0.0 standard drinks of alcohol   Drug use: No   Sexual activity: Yes  Other Topics Concern   Not on file  Social History Narrative   Rents, 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   Social Drivers of Corporate investment banker Strain: Low Risk  (07/26/2023)   [facility]   Overall Financial Resource Strain (CARDIA)    Difficulty of Paying Living Expenses: Not hard at all  Food Insecurity: No Food Insecurity (11/05/2022)   Hunger Vital Sign    Worried About Running Out of Food in the Last Year: Never true    Ran Out of Food in the Last Year: Never true  Transportation Needs: No Transportation Needs (11/05/2022)   PRAPARE - Administrator, Civil Service (Medical): No    Lack of Transportation (Non-Medical): No  Physical Activity: Sufficiently Active (11/05/2022)   Exercise Vital Sign    Days of Exercise per Week: 2 days    Minutes of Exercise per Session: 120 min  Stress: No Stress Concern Present (11/05/2022)   [employer] of Occupational Health - Occupational Stress Questionnaire    Feeling of Stress : Not at all  Social Connections: Socially Isolated (11/05/2022)   Social Connection and Isolation Panel [NHANES]    Frequency of Communication with Friends and Family: Never    Frequency of Social Gatherings with Friends and Family:  Never    Attends Religious Services: More than 4 times per year    Active Member of Clubs or Organizations: No    Attends Banker Meetings: Never    Marital Status: Never married  Intimate Partner Violence: Not At Risk (11/05/2022)   Humiliation, Afraid, Rape, and Kick questionnaire    Fear of Current or Ex-Partner: No    Emotionally Abused: No    Physically Abused: No    Sexually Abused: No     No Known Allergies   CBC    Component Value Date/Time   WBC 8.2 06/23/2023 1504   WBC 7.4 01/10/2023 1114   RBC 5.28 06/23/2023 1504   RBC 5.23 01/10/2023 1114   HGB 15.8 06/23/2023 1504   HCT 48.1 06/23/2023 1504   PLT 229 06/23/2023 1504   MCV 91 06/23/2023 1504   MCV 94 08/03/2014 1245   MCH 29.9 06/23/2023 1504   MCH 29.8 01/10/2023 1114   MCHC 32.8 06/23/2023 1504   MCHC 32.6 01/10/2023 1114   RDW 13.0 06/23/2023 1504   RDW 14.3 08/03/2014 1245   LYMPHSABS 2.8 06/23/2023 1504   LYMPHSABS 2.8 04/16/2014 0337   MONOABS 0.5 08/04/2018 2351   MONOABS 0.4 04/16/2014 0337   EOSABS 0.1 06/23/2023 1504   EOSABS 0.1 04/16/2014 0337   BASOSABS 0.0 06/23/2023 1504   BASOSABS  0.0 04/16/2014 2952    Pulmonary Functions Testing Results:     No data to display          Outpatient Medications Prior to Visit  Medication Sig Dispense Refill   Accu-Chek Softclix Lancets lancets Test blood sugars 4 (four) times daily. 100 each 3   amLODipine (NORVASC) 5 MG tablet Take 1 tablet (5 mg total) by mouth daily. 90 tablet 1   aspirin 81 MG chewable tablet Chew 1 tablet (81 mg total) by mouth daily. 90 tablet 1   atorvastatin (LIPITOR) 40 MG tablet Take 1 tablet (40 mg total) by mouth daily. 90 tablet 1   Blood Glucose Monitoring Suppl (ACCU-CHEK GUIDE) w/Device KIT check blood sugar as directed 1 kit 0   Blood Glucose Monitoring Suppl (TRUE METRIX METER) w/Device KIT use to check blood glucose 1 kit 0   dapagliflozin propanediol (FARXIGA) 5 MG TABS tablet Take 1 tablet (5 mg  total) by mouth daily. 90 tablet 3   fluticasone-salmeterol (ADVAIR DISKUS) 100-50 MCG/ACT AEPB Inhale 1 puff into the lungs 2 (two) times daily. 180 each 3   glucose blood (ACCU-CHEK GUIDE TEST) test strip Test blood sugar 3 (three) times daily. 100 each 3   glucose blood (RIGHTEST GS550 BLOOD GLUCOSE) test strip test up to 4 times a day 100 each 3   Insulin Glargine (BASAGLAR KWIKPEN) 100 UNIT/ML Inject 16 Units into the skin at bedtime. 15 mL 5   insulin lispro (HUMALOG KWIKPEN) 100 UNIT/ML KwikPen Inject 0.02 mLs (2 Units total) into the skin 3 (three) times daily with meals. 0-149 = 0 units; 150-199= 2 units; 200-249= 4 units; 250-299 = 6 units; 300-349= 8 units; 350-399= 10 units; 400-449= 12 units and please call clinic to report sugar and insulin taken 15 mL 1   Insulin Pen Needle (TECHLITE PEN NEEDLES) 31G X 5 MM MISC Use as directed with insulin. 100 each 2   lisinopril (ZESTRIL) 20 MG tablet TAKE ONE TABLET BY MOUTH ONCE EVERY DAY 90 tablet 3   metFORMIN (GLUCOPHAGE) 500 MG tablet Take 2 tablets (1,000 mg total) by mouth 2 (two) times daily with a meal. 120 tablet 1   nicotine (NICODERM CQ - DOSED IN MG/24 HOURS) 14 mg/24hr patch Place 1 patch (14 mg total) onto the skin daily for 14 days. Remove patch before applying another. Continue for 2 weeks. Start after completing course of 21 mg patch. 14 patch 0   nicotine (NICODERM CQ - DOSED IN MG/24 HOURS) 21 mg/24hr patch Place 1 patch (21 mg total) onto the skin daily for 42 days. Remove patch before applying another. Continue for 6 weeks. 42 patch 0   nicotine (NICODERM CQ - DOSED IN MG/24 HR) 7 mg/24hr patch Place 1 patch (7 mg total) onto the skin daily for 14 days. Remove patch before applying another. Continue for 2 weeks. Start after completing course of 14 mg patch. 14 patch 0   nitroGLYCERIN (NITROSTAT) 0.4 MG SL tablet Dissolve 1 tablet (0.4 mg total) under the tongue once every 5 (five) minutes as needed for chest pain. If no relief  after first dose, call 911. (Max of 3 doses in 15 minutes). 30 tablet 1   pantoprazole (PROTONIX) 20 MG tablet Take 1 tablet (20 mg total) by mouth daily. 30 tablet 2   TRUEplus Lancets 33G MISC use to check blood sugar 100 each 0   PROAIR HFA 108 (90 Base) MCG/ACT inhaler INHALE 2 PUFFS BY MOUTH ONCE EVERY 4 HOURS AS  NEEDED. REPLACES ALBUTEROL. 25.5 g 3   No facility-administered medications prior to visit.

## 2023-09-16 ENCOUNTER — Other Ambulatory Visit: Payer: Self-pay

## 2023-09-21 ENCOUNTER — Other Ambulatory Visit: Payer: Self-pay

## 2023-09-21 MED FILL — Insulin Glargine Soln Pen-Injector 100 Unit/ML: SUBCUTANEOUS | 75 days supply | Qty: 12 | Fill #1 | Status: AC

## 2023-09-23 ENCOUNTER — Other Ambulatory Visit: Payer: Self-pay

## 2023-09-23 MED ORDER — NURTEC 75 MG PO TBDP
ORAL_TABLET | Freq: Every day | ORAL | 5 refills | Status: AC
  Filled 2023-09-23: qty 16, 30d supply, fill #0

## 2023-09-23 MED ORDER — AIMOVIG 140 MG/ML ~~LOC~~ SOAJ
SUBCUTANEOUS | 5 refills | Status: AC
  Filled 2023-09-23: qty 1, 28d supply, fill #0

## 2023-09-28 ENCOUNTER — Other Ambulatory Visit: Payer: Self-pay

## 2023-09-28 MED ORDER — ERGOCALCIFEROL 1.25 MG (50000 UT) PO CAPS
1.0000 | ORAL_CAPSULE | ORAL | 0 refills | Status: AC
  Filled 2023-09-28: qty 8, 56d supply, fill #0

## 2023-09-30 ENCOUNTER — Ambulatory Visit: Payer: Medicaid Other | Attending: Cardiology | Admitting: Cardiology

## 2023-09-30 ENCOUNTER — Encounter: Payer: Self-pay | Admitting: Cardiology

## 2023-09-30 VITALS — BP 125/84 | HR 92 | Ht 70.0 in | Wt 215.2 lb

## 2023-09-30 DIAGNOSIS — R079 Chest pain, unspecified: Secondary | ICD-10-CM

## 2023-09-30 DIAGNOSIS — E1169 Type 2 diabetes mellitus with other specified complication: Secondary | ICD-10-CM

## 2023-09-30 DIAGNOSIS — Z87891 Personal history of nicotine dependence: Secondary | ICD-10-CM | POA: Diagnosis not present

## 2023-09-30 DIAGNOSIS — E785 Hyperlipidemia, unspecified: Secondary | ICD-10-CM

## 2023-09-30 DIAGNOSIS — J449 Chronic obstructive pulmonary disease, unspecified: Secondary | ICD-10-CM

## 2023-09-30 DIAGNOSIS — E119 Type 2 diabetes mellitus without complications: Secondary | ICD-10-CM | POA: Diagnosis not present

## 2023-09-30 DIAGNOSIS — I10 Essential (primary) hypertension: Secondary | ICD-10-CM | POA: Diagnosis not present

## 2023-09-30 DIAGNOSIS — Z8673 Personal history of transient ischemic attack (TIA), and cerebral infarction without residual deficits: Secondary | ICD-10-CM

## 2023-09-30 NOTE — Patient Instructions (Signed)
 Medication Instructions:  - Your physician recommends that you continue on your current medications as directed. Please refer to the Current Medication list given to you today.  *If you need a refill on your cardiac medications before your next appointment, please call your pharmacy*   Lab Work: - none ordered  If you have labs (blood work) drawn today and your tests are completely normal, you will receive your results only by: MyChart Message (if you have MyChart) OR A paper copy in the mail If you have any lab test that is abnormal or we need to change your treatment, we will call you to review the results.   Testing/Procedures: - none ordered   Follow-Up: At [facility], you and your health needs are our priority.  As part of our continuing mission to provide you with exceptional heart care, we have created designated Provider Care Teams.  These Care Teams include your primary Cardiologist (physician) and Advanced Practice Providers (APPs -  Physician Assistants and Nurse Practitioners) who all work together to provide you with the care you need, when you need it.  We recommend signing up for the patient portal called "MyChart".  Sign up information is provided on this After Visit Summary.  MyChart is used to connect with patients for Virtual Visits (Telemedicine).  Patients are able to view lab/test results, encounter notes, upcoming appointments, etc.  Non-urgent messages can be sent to your provider as well.   To learn more about what you can do with MyChart, go to [email or web address].    Your next appointment:   As needed   Provider:   You may see Bryan Lemma, MD or one of the following Advanced Practice Providers on your designated Care Team:   Nicolasa Ducking, NP Eula Listen, PA-C Cadence Fransico Michael, PA-C Charlsie Quest, NP Carlos Levering, NP    Other Instructions N/a

## 2023-09-30 NOTE — Progress Notes (Signed)
 Cardiology Office Note:  .   Date:  10/02/2023  ID:  Roy Norton, DOB 1961-01-31, MRN [id number] PCP: Dineen Channel, DEVONNA  [facility] Providers Cardiologist:  None     Chief Complaint  Patient presents with   New Patient (Initial Visit)    Chest pain in May 2024.  Cardiology consult placed.  Patient feels well. Medications reviewed verbally.     Patient Profile: .     Roy Norton is a mildly obese 63 y.o. 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 He was also referred by Dr. Isadora from pulmonary medicine.   Roy Norton was seen by Chioma E. Iloabachie, NP from [facility] on February 04, 2023 as a hospital follow-up.  He noted compliance to medications and indicate he was trying to make lifestyle modifications.  They discussed his ER visit at [facility] on 01/10/2023 for chest pain where he was ruled out for MI based on troponin levels and normal EKG.  The plan was for him to follow-up with cardiology but he had not yet done so.  They noted that his A1c had improved somewhat (down from 8.3% to 7.9%, and indicating that his blood sugar levels are ranging from 99-159.  He did not mention having any further chest pain.  At that time he was on amlodipine  5 mg daily, lisinopril  20 mg daily and Lipitor 40 mg daily, Farxiga  5 mg daily, along with glargine insulin  and meal coverage.  He was only on Advair  and as needed albuterol . => Referral for cardiology resubmitted.  He then went to [facility] clinic on July 26, 2023 to establish primary care .He was still smoking a pack a day but wanting to quit. No history of stent or coronary artery disease but no to the chest pain episode in May.  A1c was down to 7.1.  No further chest pain complaints.  No knowledge of or  comment about cardiology consult  He was recently seen by Dr. Isadora from [facility] (on September 23, 2023) for evaluation of shortness of breath and history of asthma.  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  Patient was noted to be a relatively poor historian not elaborating much on his medical history. => Hx PFTs and started on Spiriva  as well as Advair .  Smoke cessation counseling  Subjective  Discussed the use of AI scribe software for clinical note transcription with the patient, who gave verbal consent to proceed.  History of Present Illness   Roy Norton is a 63 year old male with PMH of COPD & prior CVA with CRFs of smoking, DM-2, HLD & HTN who presents for a cardiology consultation.  Initial consult placed in May-June of 2024 following an ER visit/overnight observation for chest pain at [facility] on 01/10/2023.  He describes the episode as a mild myocardial infarction, resulting in an overnight hospitalization -- He presented with sharp chest pressure reportedly relieved by NTG, BUT RULED OUT FOR MI with negative Troponin levels & unremarkable EKG.  A stress test was planned but not completed, and Cardiology Consultation placed that he did not pursue.    Since then, he has not experienced any further chest pain, heart racing,  or shortness of breath. His troponin levels during the episode were not elevated. Previous cardiac evaluations include a treadmill stress test in July 2018, which showed no EKG changes, and a nuclear stress test in September 2016, indicating normal pump function and low risk with no blockages. An echocardiogram in September 2016 showed low normal pump function at 50-55% with normal valves.  He has a history of diabetes, managed with Farxiga  5 mg, Humalog  insulin  with meals, and Basaglar  insulin  16 units nightly. He also takes metformin  1000 mg twice daily. His recent A1c is 7.1, indicating  controlled diabetes.  He is on lisinopril  and Norvasc  for blood pressure management. His blood pressure is generally well-controlled, though his diastolic pressure is slightly elevated.  He uses Advair  twice every morning and a rescue inhaler once a day as needed for COPD management. He also uses Spiriva .  He has a history of smoking and is currently using the Nicoderm patch, although it is not helping him quit. He has a history of heavy alcohol use but has since quit drinking.  Family history is notable for his father, who had diabetes and died from complications related to alcohol use. His mother is alive with no significant medical history, and his siblings are reported to have no medical issues.  CV ROS: No recent chest pain/ pressure with rest or exerion.  No more than usual shortness of breath on exertion, but not at rest.  No PND, orthopnea or edema. lightheadedness, dizziness, or recurrent stroke symptoms. No blood in stool or epistaxis.   No rapid heart rates or prominent irregular heartbeats.  No recurrent TIA/CVA/amaurosis fugax symptoms. No claudication.       Objective   Medications DM-2: - Farxiga  5 mg - Metformin  1000 mg twice a day; - Humalog  insulin  with meals; - Basaglar  16 g at night BP: - Lisinopril  20 mg daily, - Norvasc  5 mg daily CV: - Atorvastatin  40 mg.,   Aspirin  81 mg Pulm: - Advair  1 puff twice daily, - Spiriva  2 puffs daily; Albuterol  as needed - Nicoderm patch - Protonix  20 mg daily  Soc Hx:  Producer, Television/film/video  Family History - Patient's father died from drinking - Patient's father had diabetes  Studies Reviewed: SABRA   EKG Interpretation Date/Time:  Thursday September 30 2023 10:14:39 EST Ventricular Rate:  92 PR Interval:  162 QRS Duration:  94 QT Interval:  360 QTC Calculation: 445 R Axis:   21  Text Interpretation: Normal sinus rhythm Normal ECG When compared with ECG of 10-Jan-2023 11:02, Nonspecific T wave abnormality now evident in Inferior  leads Confirmed by Anner Lenis ([id number]) on 09/30/2023 10:59:21 AM   Results LABS HbA1c: 7.1% (05/2023) LDL: 81 mg/dL (89/7975) Total cholesterol: 139 mg/dL (89/7975) Troponin: Negative (12/2022)  DIAGNOSTIC Treadmill stress test: Walked 7 minutes, no EKG changes (02/2017) Nuclear stress test: Normal ejection fraction, low risk, no blockages (04/2015) Echocardiogram: Ejection fraction 50-55%, all valves normal (04/2015)  Lab Results  Component Value Date   CHOL 139 06/23/2023   HDL 37 (L) 06/23/2023   LDLCALC 81 06/23/2023   TRIG 114 06/23/2023   CHOLHDL 3.8 06/23/2023   Lab Results  Component Value Date   NA 141 06/23/2023   K 4.2 06/23/2023   CREATININE 0.89 06/23/2023   EGFR 97 06/23/2023   GLUCOSE 130 (H) 06/23/2023   Lab Results  Component Value Date   HGBA1C 7.1 (H) 06/23/2023    Risk Assessment/Calculations:        Physical Exam:  VS:  BP 125/84 (BP Location: Left Arm, Patient Position: Sitting, Cuff Size: Normal)   Pulse 92   Ht 5' 10 (1.778 m)   Wt 215 lb 3.2 oz (97.6 kg)   SpO2 96%   BMI 30.88 kg/m    Wt Readings from Last 3 Encounters:  09/30/23 215 lb 3.2 oz (97.6 kg)  09/14/23 218 lb (98.9 kg)  06/23/23 212 lb 11.2 oz (96.5 kg)    GEN: Well nourished, well groomed; in no acute distress; mildly obese.  Otherwise healthy appearing NECK: No JVD; No carotid bruits CARDIAC: RRR, distant heart sounds but normal S1, S2; no murmurs, rubs, gallops RESPIRATORY:  Clear to auscultation without rales, wheezing or rhonchi ; nonlabored, good air movement. ABDOMEN: Soft, non-tender, non-distended EXTREMITIES:  No edema; No deformity     ASSESSMENT AND PLAN: .    Problem List Items Addressed This Visit       Cardiology Problems   Essential hypertension (Chronic)   Currently managed with Lisinopril  20 mg daily (appropriate for diabetes) and Norvasc  5 mg daily. BP well-controlled today. -Continue current medication regimen.      Hyperlipidemia  associated with type 2 diabetes mellitus (HCC) (Chronic)   Currently managed with Atorvastatin  40mg  and Aspirin  81mg . Recent LDL was 81 and total cholesterol was 139. With his history of stroke, with 1 to see the LDL less than 70.   Would need to see a trend of lipids and consider possibility of additional therapy although he is on many medications and was not interested in adding any new ones. -Continue current medication regimen. -Will continue to stress lifestyle medication dietary changes.        Other   Chest pain at rest - Primary (Chronic)   Chest pain evaluation in the ER in May 2024.  Ruled out for MI. No current symptoms of chest pain, palpitations, or shortness of breath. Previous stress tests and echocardiogram showed no significant abnormalities. -No changes to current medication regimen. -As he is already on appropriate medications for his distal cardiac risk factors with adequate blood pressure, lipid and glycemic control, the major risk factor remains smoking => counseling provided. -Return for evaluation if symptoms of chest tightness, pressure, palpitations, or shortness of breath occur.      Relevant Orders   EKG 12-Lead (Completed)   COPD (chronic obstructive pulmonary disease) (HCC) (Chronic)   Using Advair  (2 puffs in the morning), Spiriva , and a rescue inhaler (as needed, approximately once a day). -Continue current medication regimen.      Diabetes mellitus without complication (HCC) (Chronic)   Type 2 Diabetes Mellitus Currently managed with Farxiga  5mg , Humalog  insulin  with meals, and Basaglar  16g at night. Recent A1C was 7.1. -Continue current medication regimen.      History of stroke (Chronic)   Details somewhat unknown but he reports a history of at least 2 strokes. 1 he noted was in 2011.  No current symptoms or recurrent stroke symptoms. -Continue current medication regimen. ->  Continue to stressed importance of glycemic and lipid control along with  cessation.  BP seems to be well-controlled. -Continue aspirin  81 mg      History of tobacco abuse (Chronic)   Smoke cessation counseling provided.  He says he is currently using nicotine  patches but it did not seem to be helping.  Tough to tell what he is actually tried in the past.  He did not want to apply too much.  Will defer to PCCM, and Primary Care Provider  General Health Maintenance -Continue current medication regimen. -Follow-Up: Return if symptoms worsen or fail to improve, for Followup when necessary.     Signed, Alm MICAEL Clay, MD, MS Alm Clay, M.D., M.S. Interventional Cardiologist  [facility]  Pager # [phone] Phone # [phone] [address] [place], [place] [address]

## 2023-10-01 ENCOUNTER — Other Ambulatory Visit: Payer: Self-pay | Admitting: Neurology

## 2023-10-01 ENCOUNTER — Encounter: Payer: Self-pay | Admitting: Neurology

## 2023-10-01 DIAGNOSIS — R519 Headache, unspecified: Secondary | ICD-10-CM

## 2023-10-02 ENCOUNTER — Encounter: Payer: Self-pay | Admitting: Cardiology

## 2023-10-02 DIAGNOSIS — Z8673 Personal history of transient ischemic attack (TIA), and cerebral infarction without residual deficits: Secondary | ICD-10-CM | POA: Insufficient documentation

## 2023-10-02 DIAGNOSIS — J449 Chronic obstructive pulmonary disease, unspecified: Secondary | ICD-10-CM | POA: Insufficient documentation

## 2023-10-02 NOTE — Assessment & Plan Note (Signed)
 Currently managed with Atorvastatin  40mg  and Aspirin  81mg . Recent LDL was 81 and total cholesterol was 139. With his history of stroke, with 1 to see the LDL less than 70.   Would need to see a trend of lipids and consider possibility of additional therapy although he is on many medications and was not interested in adding any new ones. -Continue current medication regimen. -Will continue to stress lifestyle medication dietary changes.

## 2023-10-02 NOTE — Assessment & Plan Note (Signed)
 Type 2 Diabetes Mellitus Currently managed with Farxiga  5mg , Humalog  insulin  with meals, and Basaglar  16g at night. Recent A1C was 7.1. -Continue current medication regimen.

## 2023-10-02 NOTE — Assessment & Plan Note (Signed)
 Currently managed with Lisinopril  20 mg daily (appropriate for diabetes) and Norvasc  5 mg daily. BP well-controlled today. -Continue current medication regimen.

## 2023-10-02 NOTE — Assessment & Plan Note (Signed)
 Chest pain evaluation in the ER in May 2024.  Ruled out for MI. No current symptoms of chest pain, palpitations, or shortness of breath. Previous stress tests and echocardiogram showed no significant abnormalities. -No changes to current medication regimen. -As he is already on appropriate medications for his distal cardiac risk factors with adequate blood pressure, lipid and glycemic control, the major risk factor remains smoking => counseling provided. -Return for evaluation if symptoms of chest tightness, pressure, palpitations, or shortness of breath occur.

## 2023-10-02 NOTE — Assessment & Plan Note (Signed)
 Using Advair  (2 puffs in the morning), Spiriva , and a rescue inhaler (as needed, approximately once a day). -Continue current medication regimen.

## 2023-10-02 NOTE — Assessment & Plan Note (Signed)
 Details somewhat unknown but he reports a history of at least 2 strokes. 1 he noted was in 2011.  No current symptoms or recurrent stroke symptoms. -Continue current medication regimen. ->  Continue to stressed importance of glycemic and lipid control along with cessation.  BP seems to be well-controlled. -Continue aspirin  81 mg

## 2023-10-02 NOTE — Assessment & Plan Note (Addendum)
 Smoke cessation counseling provided.  He says he is currently using nicotine  patches but it did not seem to be helping.  Tough to tell what he is actually tried in the past.  He did not want to apply too much.  Will defer to PCCM, and Primary Care Provider

## 2023-10-06 ENCOUNTER — Other Ambulatory Visit: Payer: Self-pay

## 2023-10-06 MED FILL — Lancets: 25 days supply | Qty: 100 | Fill #1 | Status: AC

## 2023-10-06 MED FILL — Glucose Blood Test Strip: 30 days supply | Qty: 100 | Fill #2 | Status: AC

## 2023-10-12 ENCOUNTER — Other Ambulatory Visit: Payer: Self-pay

## 2023-10-13 ENCOUNTER — Other Ambulatory Visit: Payer: Self-pay

## 2023-10-26 ENCOUNTER — Other Ambulatory Visit: Payer: Self-pay

## 2023-11-01 ENCOUNTER — Other Ambulatory Visit: Payer: Self-pay

## 2023-11-01 MED FILL — Lancets: 25 days supply | Qty: 100 | Fill #2 | Status: AC

## 2023-11-02 ENCOUNTER — Other Ambulatory Visit: Payer: Self-pay

## 2023-11-02 MED FILL — Glucose Blood Test Strip: 30 days supply | Qty: 100 | Fill #3 | Status: AC

## 2023-11-24 ENCOUNTER — Other Ambulatory Visit: Payer: Self-pay

## 2023-11-24 MED FILL — Metformin HCl Tab 500 MG: ORAL | 30 days supply | Qty: 120 | Fill #1 | Status: AC

## 2023-12-06 ENCOUNTER — Other Ambulatory Visit: Payer: Self-pay | Admitting: Physician Assistant

## 2023-12-06 ENCOUNTER — Other Ambulatory Visit: Payer: Self-pay

## 2023-12-06 DIAGNOSIS — E119 Type 2 diabetes mellitus without complications: Secondary | ICD-10-CM

## 2023-12-06 MED FILL — Atorvastatin Calcium Tab 40 MG (Base Equivalent): ORAL | 90 days supply | Qty: 90 | Fill #1 | Status: AC

## 2023-12-06 MED FILL — Amlodipine Besylate Tab 5 MG (Base Equivalent): ORAL | 90 days supply | Qty: 90 | Fill #1 | Status: AC

## 2023-12-07 ENCOUNTER — Other Ambulatory Visit: Payer: Self-pay

## 2023-12-07 MED FILL — "Insulin Pen Needle 31 G X 5 MM (1/5"" or 3/16"")": 25 days supply | Qty: 100 | Fill #0 | Status: AC

## 2023-12-07 MED FILL — Glucose Blood Test Strip: 30 days supply | Qty: 100 | Fill #0 | Status: AC

## 2023-12-07 NOTE — Telephone Encounter (Signed)
 Requested Prescriptions  Pending Prescriptions Disp Refills   ACCU-CHEK GUIDE TEST test strip [Pharmacy Med Name: glucose blood (ACCU-CHEK GUIDE TEST) test strip] 100 each 3    Sig: Test blood sugar 3 (three) times daily.     There is no refill protocol information for this order     INSUPEN PEN NEEDLES 31G X 5 MM MISC [Pharmacy Med Name: Insulin Pen Needle (TECHLITE PEN NEEDLES) 31G X 5 MM Misc] 100 each 2    Sig: Use as directed with insulin.     Endocrinology: Diabetes - Testing Supplies Failed - 12/07/2023  2:04 PM      Failed - Valid encounter within last 12 months    Recent Outpatient Visits   None     Future Appointments             In 2 months Elta Halter, MD [facility]

## 2023-12-10 ENCOUNTER — Other Ambulatory Visit: Payer: Self-pay

## 2023-12-10 MED FILL — Insulin Glargine Soln Pen-Injector 100 Unit/ML: SUBCUTANEOUS | 75 days supply | Qty: 12 | Fill #2 | Status: CN

## 2023-12-17 ENCOUNTER — Other Ambulatory Visit: Payer: Self-pay | Admitting: Physician Assistant

## 2023-12-17 NOTE — Telephone Encounter (Signed)
 Requested Prescriptions  Refused Prescriptions Disp Refills   metFORMIN  (GLUCOPHAGE ) 500 MG tablet 120 tablet 1    Sig: Take 2 tablets (1,000 mg total) by mouth 2 (two) times daily with a meal.     Endocrinology:  Diabetes - Biguanides Failed - 12/17/2023  2:38 PM      Failed - B12 Level in normal range and within 720 days    No results found for: "VITAMINB12"       Failed - Valid encounter within last 6 months    Recent Outpatient Visits   None     Future Appointments             In 1 month Elta Halter, MD [facility]            Passed - Cr in normal range and within 360 days    Creatinine  Date Value Ref Range Status  08/03/2014 0.98 0.60 - 1.30 mg/dL Final   Creatinine, Ser  Date Value Ref Range Status  06/23/2023 0.89 0.76 - 1.27 mg/dL Final         Passed - HBA1C is between 0 and 7.9 and within 180 days    Hemoglobin A1C  Date Value Ref Range Status  08/01/2015 5.9  Final   Hgb A1c MFr Bld  Date Value Ref Range Status  06/23/2023 7.1 (H) 4.8 - 5.6 % Final    Comment:             Prediabetes: 5.7 - 6.4          Diabetes: >6.4          Glycemic control for adults with diabetes: <7.0          Passed - eGFR in normal range and within 360 days    EGFR (African American)  Date Value Ref Range Status  08/03/2014 >60 >5mL/min Final  04/16/2014 >60  Final   GFR calc Af Amer  Date Value Ref Range Status  08/28/2020 106 >59 mL/min/1.73 Final    Comment:    **In accordance with recommendations from the NKF-ASN Task force,**   [facility] is in the process of updating its eGFR calculation to the   2021 CKD-EPI creatinine equation that estimates kidney function   without a race variable.    EGFR (Non-African Amer.)  Date Value Ref Range Status  08/03/2014 >60 >80mL/min Final    Comment:    eGFR values <25mL/min/1.73 m2 may be an indication of chronic kidney disease (CKD). Calculated eGFR, using the MRDR Study equation, is useful in   patients with stable renal function. The eGFR calculation will not be reliable in acutely ill patients when serum creatinine is changing rapidly. It is not useful in patients on dialysis. The eGFR calculation may not be applicable to patients at the low and high extremes of body sizes, pregnant women, and vegetarians.   04/16/2014 >60  Final    Comment:    eGFR values <37mL/min/1.73 m2 may be an indication of chronic kidney disease (CKD). Calculated eGFR is useful in patients with stable renal function. The eGFR calculation will not be reliable in acutely ill patients when serum creatinine is changing rapidly. It is not useful in  patients on dialysis. The eGFR calculation may not be applicable to patients at the low and high extremes of body sizes, pregnant women, and vegetarians.    GFR, Estimated  Date Value Ref Range Status  01/10/2023 >60 >60 mL/min Final    Comment:    (  NOTE) Calculated using the CKD-EPI Creatinine Equation (2021)    eGFR  Date Value Ref Range Status  06/23/2023 97 >59 mL/min/1.73 Final         Passed - CBC within normal limits and completed in the last 12 months    WBC  Date Value Ref Range Status  06/23/2023 8.2 3.4 - 10.8 x10E3/uL Final  01/10/2023 7.4 4.0 - 10.5 K/uL Final   RBC  Date Value Ref Range Status  06/23/2023 5.28 4.14 - 5.80 x10E6/uL Final  01/10/2023 5.23 4.22 - 5.81 MIL/uL Final   Hemoglobin  Date Value Ref Range Status  06/23/2023 15.8 13.0 - 17.7 g/dL Final   Hematocrit  Date Value Ref Range Status  06/23/2023 48.1 37.5 - 51.0 % Final   MCHC  Date Value Ref Range Status  06/23/2023 32.8 31.5 - 35.7 g/dL Final  16/05/9603 54.0 30.0 - 36.0 g/dL Final   [facility]  Date Value Ref Range Status  06/23/2023 29.9 26.6 - 33.0 pg Final  01/10/2023 29.8 26.0 - 34.0 pg Final   MCV  Date Value Ref Range Status  06/23/2023 91 79 - 97 fL Final  08/03/2014 94 80 - 100 fL Final   No results found for: "PLTCOUNTKUC", "LABPLAT",  "POCPLA" RDW  Date Value Ref Range Status  06/23/2023 13.0 11.6 - 15.4 % Final  08/03/2014 14.3 11.5 - 14.5 % Final

## 2023-12-19 ENCOUNTER — Other Ambulatory Visit: Payer: Self-pay

## 2023-12-21 ENCOUNTER — Other Ambulatory Visit: Payer: Self-pay

## 2023-12-30 ENCOUNTER — Ambulatory Visit: Payer: Medicaid Other | Admitting: Student in an Organized Health Care Education/Training Program

## 2024-01-04 ENCOUNTER — Other Ambulatory Visit: Payer: Self-pay

## 2024-01-04 MED ORDER — METFORMIN HCL 500 MG PO TABS
1000.0000 mg | ORAL_TABLET | Freq: Two times a day (BID) | ORAL | 3 refills | Status: AC
  Filled 2024-01-04: qty 360, 90d supply, fill #0

## 2024-01-05 ENCOUNTER — Other Ambulatory Visit: Payer: Self-pay

## 2024-01-05 MED FILL — Insulin Glargine Soln Pen-Injector 100 Unit/ML: SUBCUTANEOUS | 75 days supply | Qty: 12 | Fill #2 | Status: AC

## 2024-01-26 ENCOUNTER — Other Ambulatory Visit: Payer: Self-pay

## 2024-01-26 MED ORDER — DOXYCYCLINE HYCLATE 100 MG PO TABS
100.0000 mg | ORAL_TABLET | Freq: Two times a day (BID) | ORAL | 0 refills | Status: AC
  Filled 2024-01-26: qty 20, 10d supply, fill #0

## 2024-02-07 ENCOUNTER — Other Ambulatory Visit: Payer: Self-pay | Admitting: Physician Assistant

## 2024-02-07 ENCOUNTER — Other Ambulatory Visit: Payer: Self-pay

## 2024-02-07 ENCOUNTER — Other Ambulatory Visit: Payer: Self-pay | Admitting: Gerontology

## 2024-02-07 DIAGNOSIS — I10 Essential (primary) hypertension: Secondary | ICD-10-CM

## 2024-02-08 ENCOUNTER — Other Ambulatory Visit: Payer: Self-pay

## 2024-02-08 MED ORDER — LISINOPRIL 20 MG PO TABS
20.0000 mg | ORAL_TABLET | Freq: Every day | ORAL | 1 refills | Status: DC
  Filled 2024-02-08: qty 90, 90d supply, fill #0
  Filled 2024-04-21: qty 90, 90d supply, fill #1

## 2024-02-08 MED FILL — "Insulin Pen Needle 31 G X 5 MM (1/5"" or 3/16"")": 25 days supply | Qty: 100 | Fill #1 | Status: AC

## 2024-02-08 MED FILL — Lancets: 25 days supply | Qty: 100 | Fill #3 | Status: AC

## 2024-02-08 MED FILL — Glucose Blood Test Strip: 30 days supply | Qty: 100 | Fill #1 | Status: AC

## 2024-02-09 ENCOUNTER — Other Ambulatory Visit: Payer: Self-pay

## 2024-02-09 ENCOUNTER — Ambulatory Visit: Payer: Self-pay | Admitting: Dermatology

## 2024-03-03 ENCOUNTER — Other Ambulatory Visit: Payer: Self-pay | Admitting: Physician Assistant

## 2024-03-03 ENCOUNTER — Other Ambulatory Visit: Payer: Self-pay

## 2024-03-03 DIAGNOSIS — E1169 Type 2 diabetes mellitus with other specified complication: Secondary | ICD-10-CM

## 2024-03-03 DIAGNOSIS — I10 Essential (primary) hypertension: Secondary | ICD-10-CM

## 2024-03-03 MED FILL — Atorvastatin Calcium Tab 40 MG (Base Equivalent): ORAL | 30 days supply | Qty: 30 | Fill #0 | Status: CN

## 2024-03-03 MED FILL — Amlodipine Besylate Tab 5 MG (Base Equivalent): ORAL | 30 days supply | Qty: 30 | Fill #0 | Status: CN

## 2024-03-14 ENCOUNTER — Other Ambulatory Visit: Payer: Self-pay

## 2024-03-17 ENCOUNTER — Other Ambulatory Visit: Payer: Self-pay | Admitting: Physician Assistant

## 2024-03-17 ENCOUNTER — Other Ambulatory Visit: Payer: Self-pay

## 2024-03-17 MED ORDER — DAPAGLIFLOZIN PROPANEDIOL 5 MG PO TABS
5.0000 mg | ORAL_TABLET | Freq: Every day | ORAL | 1 refills | Status: AC
  Filled 2024-03-17 – 2024-03-20 (×3): qty 90, 90d supply, fill #0

## 2024-03-17 MED FILL — "Insulin Pen Needle 31 G X 5 MM (1/5"" or 3/16"")": 25 days supply | Qty: 100 | Fill #2 | Status: AC

## 2024-03-17 MED FILL — Atorvastatin Calcium Tab 40 MG (Base Equivalent): ORAL | 30 days supply | Qty: 30 | Fill #0 | Status: CN

## 2024-03-17 MED FILL — Glucose Blood Test Strip: 30 days supply | Qty: 100 | Fill #2 | Status: AC

## 2024-03-17 MED FILL — Atorvastatin Calcium Tab 40 MG (Base Equivalent): ORAL | 30 days supply | Qty: 30 | Fill #0 | Status: AC

## 2024-03-17 MED FILL — Amlodipine Besylate Tab 5 MG (Base Equivalent): ORAL | 30 days supply | Qty: 30 | Fill #0 | Status: CN

## 2024-03-17 MED FILL — Amlodipine Besylate Tab 5 MG (Base Equivalent): ORAL | 30 days supply | Qty: 30 | Fill #0 | Status: AC

## 2024-03-20 ENCOUNTER — Other Ambulatory Visit: Payer: Self-pay | Admitting: Cardiology

## 2024-03-20 ENCOUNTER — Other Ambulatory Visit: Payer: Self-pay

## 2024-03-20 NOTE — Telephone Encounter (Signed)
 Requested medication (s) are due for refill today:   Provider to review  Requested medication (s) are on the active medication list:   Yes  Future visit scheduled:   No    LOV 06/23/2023.   Last ordered: 06/23/2023  Non delegated refill, also prescribed last by another provider   Requested Prescriptions  Pending Prescriptions Disp Refills   Vitamin D , Ergocalciferol , (DRISDOL ) 1.25 MG (50000 UNIT) CAPS capsule [Pharmacy Med Name: ergocalciferol  (VITAMIN D2) 1.25 MG (50000 UT) capsule] 8 capsule 0    Sig: Take 1 capsule (50,000 Units total) by mouth once a week.     Endocrinology:  Vitamins - Vitamin D  Supplementation 2 Failed - 03/20/2024 12:46 PM      Failed - Manual Review: Route requests for 50,000 IU strength to the provider      Failed - Vitamin D  in normal range and within 360 days    No results found for: CI7874NY7, CI6874NY7, [id number], 25OHVITD3, 25OHVITD2, 25OHVITD1, VD25OH       Failed - Valid encounter within last 12 months    Recent Outpatient Visits   None            Passed - Ca in normal range and within 360 days    Calcium   Date Value Ref Range Status  06/23/2023 9.7 8.6 - 10.2 mg/dL Final   Calcium , Total  Date Value Ref Range Status  08/03/2014 9.0 8.5 - 10.1 mg/dL Final

## 2024-03-21 ENCOUNTER — Other Ambulatory Visit: Payer: Self-pay

## 2024-03-22 ENCOUNTER — Other Ambulatory Visit: Payer: Self-pay

## 2024-03-22 NOTE — Telephone Encounter (Signed)
 Spoke with pt and made aware of provider response on denial of medication refill request. Pt verbalized understanding.

## 2024-03-23 ENCOUNTER — Other Ambulatory Visit: Payer: Self-pay

## 2024-04-10 ENCOUNTER — Other Ambulatory Visit: Payer: Self-pay

## 2024-04-10 ENCOUNTER — Other Ambulatory Visit: Payer: Self-pay | Admitting: Physician Assistant

## 2024-04-10 DIAGNOSIS — I10 Essential (primary) hypertension: Secondary | ICD-10-CM

## 2024-04-10 DIAGNOSIS — E1169 Type 2 diabetes mellitus with other specified complication: Secondary | ICD-10-CM

## 2024-04-10 MED ORDER — ATORVASTATIN CALCIUM 40 MG PO TABS
40.0000 mg | ORAL_TABLET | Freq: Every day | ORAL | 5 refills | Status: AC
  Filled 2024-04-10: qty 30, 30d supply, fill #0
  Filled 2024-05-23: qty 30, 30d supply, fill #1
  Filled 2024-06-26: qty 30, 30d supply, fill #2
  Filled 2024-07-19: qty 30, 30d supply, fill #3
  Filled 2024-08-25: qty 30, 30d supply, fill #4
  Filled 2024-09-22: qty 30, 30d supply, fill #5

## 2024-04-10 MED ORDER — AMLODIPINE BESYLATE 5 MG PO TABS
5.0000 mg | ORAL_TABLET | Freq: Every day | ORAL | 5 refills | Status: AC
  Filled 2024-04-10: qty 30, 30d supply, fill #0
  Filled 2024-05-23: qty 30, 30d supply, fill #1
  Filled 2024-06-26: qty 30, 30d supply, fill #2
  Filled 2024-07-19: qty 30, 30d supply, fill #3
  Filled 2024-08-25: qty 30, 30d supply, fill #4
  Filled 2024-09-22: qty 30, 30d supply, fill #5

## 2024-04-11 ENCOUNTER — Other Ambulatory Visit: Payer: Self-pay

## 2024-04-11 MED FILL — Insulin Glargine Soln Pen-Injector 100 Unit/ML: SUBCUTANEOUS | 75 days supply | Qty: 12 | Fill #3 | Status: AC

## 2024-04-21 ENCOUNTER — Other Ambulatory Visit: Payer: Self-pay | Admitting: Physician Assistant

## 2024-04-21 ENCOUNTER — Other Ambulatory Visit: Payer: Self-pay

## 2024-04-21 DIAGNOSIS — K219 Gastro-esophageal reflux disease without esophagitis: Secondary | ICD-10-CM

## 2024-04-21 NOTE — Telephone Encounter (Signed)
 Requested medication (s) are due for refill today: Yes  Requested medication (s) are on the active medication list: Yes  Last refill:  07/09/23  Future visit scheduled: No  Notes to clinic:  Needs OV, voice mailbox not set up, unable to leave message to make appointment.    Requested Prescriptions  Pending Prescriptions Disp Refills   Accu-Chek Softclix Lancets lancets 100 each 3    Sig: Test blood sugars 4 (four) times daily.     Endocrinology: Diabetes - Testing Supplies Failed - 04/21/2024  3:26 PM      Failed - Valid encounter within last 12 months    Recent Outpatient Visits   None

## 2024-04-21 NOTE — Telephone Encounter (Signed)
 LOV 06/23/23 NOV none scheduled LRF 01/19/23  He does not have an est pcp at this office. Please advise.

## 2024-04-25 ENCOUNTER — Other Ambulatory Visit: Payer: Self-pay | Admitting: Physician Assistant

## 2024-04-25 ENCOUNTER — Other Ambulatory Visit: Payer: Self-pay

## 2024-04-25 NOTE — Telephone Encounter (Signed)
 Pt needs an appointment before the refill.

## 2024-04-25 NOTE — Telephone Encounter (Signed)
 No longer under [facility] care

## 2024-05-01 NOTE — Progress Notes (Signed)
 Chief Complaint  Patient presents with  . 3 month follow up    Patient is agreeable to [employer] scribe.   History of Present Illness Roy Norton is a 63 year old male who presents for a three-month follow-up.  Type 2 diabetes - Type 2 diabetes managed with Farxiga  5 mg, metformin  1000 mg twice daily, and Lantus  insulin  (16 units at night, dose adjusted based on glucose levels) - Morning fasting blood sugar is 198 mg/dL  Hypertension - Hypertension controlled with amlodipine  5 mg, lisinopril  20 mg, and metoprolol  - Experiencing some stress due to personal circumstances - No chest pain, shortness of breath, or recent dizziness.  Chronic obstructive pulmonary disease (copd) and tobacco use - COPD managed with Advair  and Spiriva  inhalers - No chest pain or shortness of breath - Continues to smoke  Migraine headaches - Follows with neurology for migraines - No recent headaches - Doing well with current regiment.  Depressive symptoms and emotional distress - Significant stress and emotional distress related to stepfather's declining health - Feels down, sad, or hopeless every day for the past two weeks - Decreased interest in hobbies, constant fatigue, and poor sleep - No current suicidal thoughts, but history of suicidal ideation after father's death - Not interested in therapy or medication for mental health at this time    ROS Review of systems is unremarkable for any active cardiac, respiratory, GI, GU, hematologic, neurologic, dermatologic, HEENT, or psychiatric symptoms except as noted above.  No fevers, chills, or constitutional symptoms.   Current Outpatient Medications  Medication Sig Dispense Refill  . ACCU-CHEK SOFTCLIX LANCETS lancets 1 each 3 (three) times daily    . albuterol  MDI, PROVENTIL , VENTOLIN , PROAIR , HFA (PROAIR  HFA) 90 mcg/actuation inhaler Inhale 2 inhalations into the lungs every 4 (four) hours as needed    . amLODIPine  (NORVASC ) 5 MG tablet Take 1  tablet (5 mg total) by mouth daily. 30 tablet 5  . aspirin  81 MG chewable tablet Take 81 mg by mouth once daily    . atorvastatin  (LIPITOR) 40 MG tablet Take 1 tablet (40 mg total) by mouth daily. 30 tablet 5  . BASAGLAR  KWIKPEN U-100 INSULIN  pen injector (concentration 100 units/mL) Inject 16 Units subcutaneously    . butalbital-acetaminophen -caffeine (FIORICET) 50-325-40 mg tablet Take 1 tablet by mouth every 4 (four) hours as needed for Pain.    . dapagliflozin  propanediol (FARXIGA ) 5 mg tablet Take 1 tablet (5 mg total) by mouth once daily 90 tablet 1  . insulin  LISPRO (HUMALOG  KWIKPEN) pen injector (concentration 100 units/mL) Inject 2 Units subcutaneously  Inject 0.02 mLs (2 Units total) into the skin 3 (three) times daily with meals. 0-149 = 0 units; 150-199= 2 units; 200-249= 4 units; 250-299 = 6 units; 300-349= 8 units; 350-399= 10 units; 400-449= 12 units and please call clinic to report sugar and insulin  takenInstructions:Inject 0.02 mLs (2 Units total) into the skin 3 (three) times daily with meals. 0-149 = 0 units; 150-199= 2 units; 200-249= 4 units; 250-299 = 6 units; 300-349= 8 units; 350-399= 10 units; 400-449= 12 units and please call clinic to report sugar and insulin  taken    . lisinopriL  (ZESTRIL ) 20 MG tablet Take 1 tablet (20 mg total) by mouth once daily 90 tablet 1  . metFORMIN  (GLUCOPHAGE ) 500 MG tablet Take 2 tablets (1,000 mg total) by mouth 2 (two) times daily with meals 360 tablet 3  . metoprolol  succinate (TOPROL -XL) 25 MG XL tablet Take 25 mg by mouth once daily.    SABRA  nitroGLYcerin  (NITROSTAT ) 0.4 MG SL tablet Place 0.4 mg under the tongue every 5 (five) minutes as needed for Chest pain. May take up to 3 doses.    SABRA RIGHTEST GS550 TEST STRIPS test strip 1 strip 4 (four) times daily    . rimegepant (NURTEC ODT ) 75 mg disintegrating tablet Take at onset of migraine. No more than 75 mg in 24 hours 16 tablet 5  . SPIRIVA  RESPIMAT 2.5 mcg/actuation inhalation spray     .  topiramate  (TOPAMAX ) 50 MG tablet Take 50 mg by mouth 2 (two) times daily.    . WIXELA INHUB  100-50 mcg/dose diskus inhaler Inhale 1 puff into the lungs 2 (two) times daily. 180 each 3  . erenumab -aooe 140 mg/mL AtIn Inject 140 mg subcutaneously every 28 (twenty-eight) days (Patient not taking: Reported on 10/26/2023) 1 mL 5   No current facility-administered medications for this visit.    Allergies as of 05/01/2024 - Reviewed 05/01/2024  Allergen Reaction Noted  . Citalopram Nausea 11/08/2013  . Omeprazole  Nausea 11/08/2013    Patient Active Problem List  Diagnosis  . Type 2 diabetes mellitus without complication, with long-term current use of insulin  (CMS/HHS-HCC)  . Migraine without status migrainosus, not intractable  . Essential hypertension  . Hyperlipidemia associated with type 2 diabetes mellitus (CMS/HHS-HCC)  . Heavy tobacco smoker  . Gastroesophageal reflux disease without esophagitis  . History of stroke  . Chronic obstructive pulmonary disease (CMS/HHS-HCC)  . Caregiver burden  . Chest pain at rest  . Dizziness  . Headache  . History of insomnia  . History of tobacco abuse  . Knee pain, right  . Obesity (BMI 30-39.9), unspecified  . Perineal lump  . Skin lesion of scalp    Past Medical History:  Diagnosis Date  . GERD (gastroesophageal reflux disease)   . Hyperlipidemia   . Hypertension   . Obesity   . Panic disorder     History reviewed. No pertinent surgical history.  Vitals:   05/01/24 1232  BP: (!) 138/90  Pulse: 86  SpO2: 95%  Weight: 99.2 kg (218 lb 9.6 oz)  Height: 175.3 cm (5' 9)  PainSc: 0-No pain   Body mass index is 32.28 kg/m.  Exam  General. Well appearing; NAD; VS reviewed     Eyes. Sclera and conjunctiva clear; Vision grossly intact; extraocular movements intact Neck. Supple.  Lungs. Respirations unlabored; clear to auscultation bilaterally Cardiovascular. Heart regular rate and rhythm without murmurs, gallops, or  rubs Abdomen. Soft; non tender; non distended; no masses or organomegaly Extremities. no edema Skin. Normal color and turgor Neurologic. Alert and oriented x3; CN 2-12 grossly intact; no focal deficits   PHQ 2/9 last 3 flowsheet values     07/26/2023 05/01/2024  PHQ-9 Depression Screening   Little interest or pleasure in doing things 0 3  Feeling down, depressed, or hopeless 0 3  Trouble falling or staying asleep, or sleeping too much  2  Feeling tired or having little energy  3  Poor appetite or overeating  2  Feeling bad about yourself - or that you are a failure or have let yourself or your family down  0  Trouble concentrating on things, such as reading the newspaper or watching television  2  Moving or speaking so slowly that other people could have noticed? Or the opposite - being so fidgety or restless that you have been moving around a lot more than usual.  0  Thoughts that you would be better off dead  or hurting yourself in some way  0  Patient Health Questionnaire-9 Score  15      Depression Severity and Treatment Recommendations:  0-4= None  5-9= Mild / Treatment: Support, educate to call if worse; return in one month  10-14= Moderate / Treatment: Support, watchful waiting; Antidepressant or Psychotherapy  15-19= Moderately severe / Treatment: Antidepressant OR Psychotherapy  >= 20 = Major depression, severe / Antidepressant AND Psychotherapy  Assessment & Plan  Type 2 diabetes mellitus, on insulin  Blood sugar levels uncontrolled. Fasting blood sugar 198 mg/dL, target <869 mg/dL. On 16 units Lantus , Farxiga , Metformin . Hesitant to see endocrinologist. - Check labs to assess current status. - Adjust Lantus  to 17 or 18 units at night if fasting blood sugar remains high.  Patient not interested in increasing.  Suspect depression as below may be playing a role in his want to treat his medical conditions. - Maintain communication for insulin  regimen adjustments.  Essential  hypertension Blood pressure generally well managed with Amlodipine , Lisinopril , Metoprolol . Current reading borderline, likely stress-related. -Labs today - Continue current antihypertensive regimen. - Monitor blood pressure at home.  Chronic obstructive pulmonary disease (COPD) COPD well-managed with Wixela and Spiriva . - Continue Wixela and Spiriva  inhalers. - Encourage smoking cessation.  Gastroesophageal reflux disease (GERD) Chronic; stable; continues to focus on avoiding triggers  Major depressive disorder, recurrent Feeling down, sad, hopeless daily for two weeks. Loss of interest, fatigue, low energy. No current suicidal ideation, history 15 years ago. Resistant to medication or therapy. - Provided information on [facility] for psychiatric emergencies. - Offered sertraline (Zoloft) if he changes his mind.  Declines medication today. - Encouraged reaching out if self-harm thoughts occur.     F/U: Will schedule 68-month follow-up.  Labs today.  Advised him to call sooner if he wishes to try medication.  JASON HESTLE WHITAKER, PA  This note has been created using automated tools and reviewed for accuracy by JASON HESTLE WHITAKER.

## 2024-05-03 ENCOUNTER — Other Ambulatory Visit: Payer: Self-pay

## 2024-05-04 ENCOUNTER — Other Ambulatory Visit: Payer: Self-pay

## 2024-05-04 MED ORDER — RIGHTEST GS550 BLOOD GLUCOSE VI STRP
ORAL_STRIP | 1 refills | Status: AC
  Filled 2024-05-04: qty 100, 90d supply, fill #0
  Filled 2024-07-19: qty 50, 50d supply, fill #1

## 2024-05-04 MED ORDER — SPIRIVA RESPIMAT 2.5 MCG/ACT IN AERS
2.0000 | INHALATION_SPRAY | Freq: Every day | RESPIRATORY_TRACT | 5 refills | Status: AC
  Filled 2024-05-04: qty 4, 30d supply, fill #0
  Filled 2024-07-19: qty 4, 30d supply, fill #1
  Filled 2024-09-22: qty 4, 30d supply, fill #2

## 2024-05-04 MED ORDER — ACCU-CHEK SOFTCLIX LANCETS MISC
1 refills | Status: AC
  Filled 2024-05-04: qty 100, 90d supply, fill #0

## 2024-05-05 ENCOUNTER — Other Ambulatory Visit: Payer: Self-pay

## 2024-05-23 ENCOUNTER — Other Ambulatory Visit: Payer: Self-pay

## 2024-05-24 ENCOUNTER — Other Ambulatory Visit: Payer: Self-pay

## 2024-05-24 MED ORDER — PANTOPRAZOLE SODIUM 20 MG PO TBEC
20.0000 mg | DELAYED_RELEASE_TABLET | Freq: Every day | ORAL | 3 refills | Status: AC
  Filled 2024-05-24 (×2): qty 90, 90d supply, fill #0

## 2024-05-29 ENCOUNTER — Other Ambulatory Visit: Payer: Self-pay

## 2024-06-02 ENCOUNTER — Other Ambulatory Visit: Payer: Self-pay

## 2024-06-02 MED ORDER — MELOXICAM 15 MG PO TABS
15.0000 mg | ORAL_TABLET | Freq: Every day | ORAL | 0 refills | Status: AC
  Filled 2024-06-02: qty 14, 14d supply, fill #0

## 2024-06-02 NOTE — Progress Notes (Signed)
 Chief Complaint  Patient presents with  . Knots on head  . Flank Pain    Bilateral, 10/10    Patient is agreeable to [employer] scribe.   History of Present Illness Roy Norton is a 63 year old male who presents with side pain and scalp sores.  He has been experiencing pain on his side for the past two weeks, which began after turning his body awkwardly while picking something up. The pain is described as sore and is exacerbated by movements such as turning his body or head. It sometimes radiates to his shoulder and back and can take about thirty minutes to subside after being triggered.  Pain is not associated with eating.  He has a history of scalp sores, previously managed with an unknown medication that was effective but discontinued. The sores have since returned, and he is uncertain about the dermatologist he was seeing for this issue.  He reports a bad cough that started around the same time as the side pain. No nausea, vomiting, diarrhea, constipation, or blood in stools. He mentions having fever and chills last week and frequently feeling cold. No shortness of breath, except when the pain is severe, which he describes as 'taking my breath.'  Cough is slowly improving.  His blood sugar levels are elevated, with a reading of 218 mg/dL this morning. He is currently taking 16 units of Lantus  insulin . He also experiences bad cramps and mentions his mother's suggestion to check magnesium and potassium levels.  He has been using [facility] for pain relief, which provides some relief, but he is unable to take Tylenol  due to his blood pressure medication. He is frustrated about being taken off Tylenol , which he previously used for pain management.    ROS  Review of systems is unremarkable for any active cardiac, respiratory, GI, GU, hematologic, neurologic, dermatologic, HEENT, or psychiatric symptoms except as noted above.  No fevers, chills, or constitutional symptoms.   Current  Outpatient Medications  Medication Sig Dispense Refill  . ACCU-CHEK SOFTCLIX LANCETS lancets 1 each once daily 90 each 1  . albuterol  MDI, PROVENTIL , VENTOLIN , PROAIR , HFA (PROAIR  HFA) 90 mcg/actuation inhaler Inhale 2 inhalations into the lungs every 4 (four) hours as needed    . amLODIPine  (NORVASC ) 5 MG tablet Take 1 tablet (5 mg total) by mouth daily. 30 tablet 5  . aspirin  81 MG chewable tablet Take 81 mg by mouth once daily    . atorvastatin  (LIPITOR) 40 MG tablet Take 1 tablet (40 mg total) by mouth daily. 30 tablet 5  . BASAGLAR  KWIKPEN U-100 INSULIN  pen injector (concentration 100 units/mL) Inject 16 Units subcutaneously    . butalbital-acetaminophen -caffeine (FIORICET) 50-325-40 mg tablet Take 1 tablet by mouth every 4 (four) hours as needed for Pain.    . dapagliflozin  propanediol (FARXIGA ) 5 mg tablet Take 1 tablet (5 mg total) by mouth once daily 90 tablet 1  . insulin  LISPRO (HUMALOG  KWIKPEN) pen injector (concentration 100 units/mL) Inject 2 Units subcutaneously  Inject 0.02 mLs (2 Units total) into the skin 3 (three) times daily with meals. 0-149 = 0 units; 150-199= 2 units; 200-249= 4 units; 250-299 = 6 units; 300-349= 8 units; 350-399= 10 units; 400-449= 12 units and please call clinic to report sugar and insulin  takenInstructions:Inject 0.02 mLs (2 Units total) into the skin 3 (three) times daily with meals. 0-149 = 0 units; 150-199= 2 units; 200-249= 4 units; 250-299 = 6 units; 300-349= 8 units; 350-399= 10 units; 400-449= 12 units  and please call clinic to report sugar and insulin  taken    . lisinopriL  (ZESTRIL ) 20 MG tablet Take 1 tablet (20 mg total) by mouth once daily 90 tablet 1  . metFORMIN  (GLUCOPHAGE ) 500 MG tablet Take 2 tablets (1,000 mg total) by mouth 2 (two) times daily with meals 360 tablet 3  . metoprolol  succinate (TOPROL -XL) 25 MG XL tablet Take 25 mg by mouth once daily.    . nitroGLYcerin  (NITROSTAT ) 0.4 MG SL tablet Place 0.4 mg under the tongue every 5 (five)  minutes as needed for Chest pain. May take up to 3 doses.    . pantoprazole  (PROTONIX ) 20 MG DR tablet Take 1 tablet (20 mg total) by mouth once daily 90 tablet 3  . RIGHTEST HD449 TEST STRIPS test strip 1 each (1 strip total) once daily 90 each 1  . rimegepant (NURTEC ODT ) 75 mg disintegrating tablet Take at onset of migraine. No more than 75 mg in 24 hours 16 tablet 5  . SPIRIVA  RESPIMAT 2.5 mcg/actuation inhalation spray Inhale 2 inhalations (5 mcg total) into the lungs once daily 4 g 5  . topiramate  (TOPAMAX ) 50 MG tablet Take 50 mg by mouth 2 (two) times daily.    . WIXELA INHUB  100-50 mcg/dose diskus inhaler Inhale 1 puff into the lungs 2 (two) times daily. 180 each 3  . erenumab -aooe 140 mg/mL AtIn Inject 140 mg subcutaneously every 28 (twenty-eight) days (Patient not taking: Reported on 06/02/2024) 1 mL 5  . meloxicam  (MOBIC ) 15 MG tablet Take 1 tablet (15 mg total) by mouth once daily for 14 days 14 tablet 0   No current facility-administered medications for this visit.    Allergies as of 06/02/2024 - Reviewed 06/02/2024  Allergen Reaction Noted  . Citalopram Nausea 11/08/2013  . Omeprazole  Nausea 11/08/2013    Patient Active Problem List  Diagnosis  . Type 2 diabetes mellitus without complication, with long-term current use of insulin  (CMS/HHS-HCC)  . Migraine without status migrainosus, not intractable  . Essential hypertension  . Hyperlipidemia associated with type 2 diabetes mellitus (CMS/HHS-HCC)  . Heavy tobacco smoker  . Gastroesophageal reflux disease without esophagitis  . History of stroke  . Chronic obstructive pulmonary disease (CMS/HHS-HCC)  . Caregiver burden  . Chest pain at rest  . Dizziness  . Headache  . History of insomnia  . History of tobacco abuse  . Knee pain, right  . Obesity (BMI 30-39.9), unspecified  . Perineal lump  . Skin lesion of scalp    Past Medical History:  Diagnosis Date  . GERD (gastroesophageal reflux disease)   .  Hyperlipidemia   . Hypertension   . Obesity   . Panic disorder     History reviewed. No pertinent surgical history.  Vitals:   06/02/24 1100  BP: 134/84  Pulse: 93  SpO2: 95%  Weight: 96.7 kg (213 lb 3.2 oz)  Height: 175.3 cm (5' 9)  PainSc: 10-Worst pain ever  PainLoc: Other (Comment)   Body mass index is 31.48 kg/m.  Exam BP 134/84 (BP Location: Left upper arm, Patient Position: Sitting, BP Cuff Size: Adult)   Pulse 93   Ht 175.3 cm (5' 9)   Wt 96.7 kg (213 lb 3.2 oz)   SpO2 95%   BMI 31.48 kg/m   General. Well appearing; NAD; VS reviewed     Eyes. Sclera and conjunctiva clear; Vision grossly intact; extraocular movements intact Oropharynx. No suspicious lesions Neck. Supple. No swelling, masses, thyroid  normal size, no masses palpated.  Lungs. Respirations unlabored; clear to auscultation bilaterally Cardiovascular. Heart regular rate and rhythm without murmurs, gallops, or rubs GI: Abdomen soft, distended, mildly tender to the lower midline and upper abdomen.  No masses or swelling felt.  Bowel sounds are active.  No CVA tenderness. Extremities: No edema Skin. Normal color and turgor; various papules at the base of hair follicles in the left parietal region of the scalp.  No significant tenderness when palpating.  Assessment & Plan  Musculoskeletal pain of trunk and shoulder Musculoskeletal pain likely mechanical, exacerbated by movement, positional and muscular.  Suspect recent viral illness with cough triggered some upper abdominal lower chest wall pains which are reproducible with movement and palpation.  Muscular strain most likely.  Lung exam is reassuring and will defer x-ray.  Doubt PE or ACS. - Order CBC, CMP, lipase, magnesium, potassium, and urine analysis. - Prescribe meloxicam  for 10 days. - Advise heat application. - Consider abdominal imaging if labs abnormal.  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 Previous effective  medication unknown. - Refer to dermatology.  Type 2 diabetes mellitus on insulin  Type 2 diabetes mellitus with suboptimal blood sugar control on current insulin  regimen.  States fasting sugar this morning was 218.  Advised him to increase Lantus  by 1 to 2 units and report back sugar readings next week. - Increase Lantus  by 1-2 units.  Essential hypertension Essential hypertension well-controlled.  Will continue current regimen and focus on low-sodium diet.     F/U: Patient to follow-up as needed.  JASON HESTLE WHITAKER, PA  This note has been created using automated tools and reviewed for accuracy by JASON HESTLE WHITAKER.   Note: This dictation was prepared with Dragon dictation along with smaller phrase technology. Any transcriptional errors that result from this process are unintentional.

## 2024-06-14 ENCOUNTER — Other Ambulatory Visit: Payer: Self-pay

## 2024-06-26 ENCOUNTER — Other Ambulatory Visit: Payer: Self-pay

## 2024-07-06 ENCOUNTER — Other Ambulatory Visit: Payer: Self-pay | Admitting: Cardiology

## 2024-07-06 ENCOUNTER — Other Ambulatory Visit: Payer: Self-pay | Admitting: Physician Assistant

## 2024-07-06 ENCOUNTER — Other Ambulatory Visit: Payer: Self-pay

## 2024-07-06 DIAGNOSIS — E119 Type 2 diabetes mellitus without complications: Secondary | ICD-10-CM

## 2024-07-06 NOTE — Telephone Encounter (Signed)
 Called pt, vm not set up. Pt has est care with [facility].

## 2024-07-06 NOTE — Telephone Encounter (Signed)
 Pt does not have pcp or affiliated with our clinic

## 2024-07-08 ENCOUNTER — Other Ambulatory Visit: Payer: Self-pay

## 2024-07-10 ENCOUNTER — Other Ambulatory Visit: Payer: Self-pay

## 2024-07-10 MED ORDER — INSULIN GLARGINE 100 UNIT/ML SOLOSTAR PEN
16.0000 [IU] | PEN_INJECTOR | Freq: Every day | SUBCUTANEOUS | 1 refills | Status: AC
  Filled 2024-07-10: qty 15, 90d supply, fill #0
  Filled 2024-09-22: qty 15, 90d supply, fill #1

## 2024-07-19 ENCOUNTER — Other Ambulatory Visit: Payer: Self-pay

## 2024-07-19 MED ORDER — INSUPEN PEN NEEDLES 31G X 5 MM MISC
1.0000 | Freq: Every day | 2 refills | Status: AC
  Filled 2024-07-19: qty 100, 90d supply, fill #0

## 2024-07-31 ENCOUNTER — Other Ambulatory Visit: Payer: Self-pay

## 2024-08-01 ENCOUNTER — Other Ambulatory Visit: Payer: Self-pay

## 2024-08-02 ENCOUNTER — Other Ambulatory Visit: Payer: Self-pay

## 2024-08-03 ENCOUNTER — Other Ambulatory Visit: Payer: Self-pay

## 2024-08-03 MED ORDER — DEXCOM G7 RECEIVER DEVI
1.0000 | Freq: Every day | 1 refills | Status: AC
  Filled 2024-08-03 – 2024-08-10 (×2): qty 1, 30d supply, fill #0

## 2024-08-03 MED ORDER — DEXCOM G7 SENSOR MISC
1.0000 | 3 refills | Status: AC
  Filled 2024-08-03 – 2024-08-10 (×2): qty 9, 90d supply, fill #0

## 2024-08-03 MED ORDER — MELOXICAM 15 MG PO TABS
15.0000 mg | ORAL_TABLET | Freq: Every day | ORAL | 0 refills | Status: AC
  Filled 2024-08-03: qty 14, 14d supply, fill #0

## 2024-08-04 ENCOUNTER — Other Ambulatory Visit: Payer: Self-pay

## 2024-08-05 ENCOUNTER — Other Ambulatory Visit: Payer: Self-pay

## 2024-08-07 ENCOUNTER — Other Ambulatory Visit: Payer: Self-pay

## 2024-08-07 MED ORDER — LISINOPRIL 20 MG PO TABS
20.0000 mg | ORAL_TABLET | Freq: Every day | ORAL | 1 refills | Status: AC
  Filled 2024-08-07: qty 90, 90d supply, fill #0
  Filled 2024-09-22: qty 90, 90d supply, fill #1

## 2024-08-10 ENCOUNTER — Other Ambulatory Visit: Payer: Self-pay

## 2024-08-11 ENCOUNTER — Other Ambulatory Visit: Payer: Self-pay

## 2024-08-14 ENCOUNTER — Other Ambulatory Visit: Payer: Self-pay

## 2024-08-25 ENCOUNTER — Other Ambulatory Visit: Payer: Self-pay

## 2024-09-22 ENCOUNTER — Other Ambulatory Visit: Payer: Self-pay

## 2024-09-22 MED ORDER — DOXYCYCLINE HYCLATE 100 MG PO TABS
100.0000 mg | ORAL_TABLET | Freq: Two times a day (BID) | ORAL | 0 refills | Status: AC
  Filled 2024-09-22: qty 14, 7d supply, fill #0

## 2024-09-25 ENCOUNTER — Other Ambulatory Visit: Payer: Self-pay

## 2024-09-25 MED ORDER — FLUTICASONE-SALMETEROL 100-50 MCG/ACT IN AEPB
1.0000 | INHALATION_SPRAY | Freq: Two times a day (BID) | RESPIRATORY_TRACT | 3 refills | Status: AC
  Filled 2024-09-25 (×2): qty 180, 90d supply, fill #0

## 2024-09-26 ENCOUNTER — Other Ambulatory Visit: Payer: Self-pay

## 2024-09-29 ENCOUNTER — Other Ambulatory Visit: Payer: Self-pay

## 2024-09-29 MED ORDER — INSULIN PEN NEEDLE 31G X 6 MM MISC
2 refills | Status: AC
  Filled 2024-09-29: qty 100, 25d supply, fill #0

## 2024-09-29 MED ORDER — INSULIN LISPRO (1 UNIT DIAL) 100 UNIT/ML (KWIKPEN)
13.0000 [IU] | PEN_INJECTOR | Freq: Every day | SUBCUTANEOUS | 3 refills | Status: AC
  Filled 2024-09-29: qty 12, 90d supply, fill #0
  Filled 2024-09-29: qty 9, 69d supply, fill #0

## 2024-09-29 MED ORDER — BASAGLAR KWIKPEN 100 UNIT/ML ~~LOC~~ SOPN
22.0000 [IU] | PEN_INJECTOR | Freq: Every day | SUBCUTANEOUS | 1 refills | Status: AC
  Filled 2024-09-29: qty 15, 68d supply, fill #0
